# Patient Record
Sex: Male | Born: 1955 | Race: White | Hispanic: No | Smoking: Current every day smoker
Health system: Southern US, Community
[De-identification: ages and names within clinical notes are randomized; demographics above are authoritative.]

## PROBLEM LIST (undated history)

## (undated) DIAGNOSIS — I219 Acute myocardial infarction, unspecified: Secondary | ICD-10-CM

---

## 2008-12-24 ENCOUNTER — Encounter
Admission: RE | Admit: 2008-12-24 | Discharge: 2009-03-01 | Payer: Self-pay | Admitting: Physical Medicine and Rehabilitation

## 2009-01-03 ENCOUNTER — Encounter (HOSPITAL_COMMUNITY)
Admission: RE | Admit: 2009-01-03 | Discharge: 2009-02-02 | Payer: Self-pay | Admitting: Physical Medicine and Rehabilitation

## 2014-04-01 ENCOUNTER — Emergency Department (HOSPITAL_COMMUNITY): Payer: Medicare Other | Admitting: Certified Registered Nurse Anesthetist

## 2014-04-01 ENCOUNTER — Inpatient Hospital Stay (HOSPITAL_COMMUNITY)
Admission: EM | Admit: 2014-04-01 | Discharge: 2014-06-01 | DRG: 003 | Disposition: E | Payer: Medicare Other | Attending: Internal Medicine | Admitting: Internal Medicine

## 2014-04-01 ENCOUNTER — Encounter (HOSPITAL_COMMUNITY): Admission: EM | Disposition: E | Payer: Medicare Other | Source: Home / Self Care | Attending: Internal Medicine

## 2014-04-01 ENCOUNTER — Encounter (HOSPITAL_COMMUNITY): Payer: Self-pay | Admitting: Emergency Medicine

## 2014-04-01 ENCOUNTER — Encounter (HOSPITAL_COMMUNITY): Admission: EM | Disposition: E | Payer: Self-pay | Source: Home / Self Care | Attending: Internal Medicine

## 2014-04-01 ENCOUNTER — Inpatient Hospital Stay (HOSPITAL_COMMUNITY): Payer: Medicare Other

## 2014-04-01 DIAGNOSIS — R6521 Severe sepsis with septic shock: Secondary | ICD-10-CM | POA: Diagnosis not present

## 2014-04-01 DIAGNOSIS — I480 Paroxysmal atrial fibrillation: Secondary | ICD-10-CM | POA: Diagnosis present

## 2014-04-01 DIAGNOSIS — I252 Old myocardial infarction: Secondary | ICD-10-CM | POA: Diagnosis not present

## 2014-04-01 DIAGNOSIS — E870 Hyperosmolality and hypernatremia: Secondary | ICD-10-CM | POA: Diagnosis present

## 2014-04-01 DIAGNOSIS — E669 Obesity, unspecified: Secondary | ICD-10-CM | POA: Diagnosis present

## 2014-04-01 DIAGNOSIS — I5021 Acute systolic (congestive) heart failure: Secondary | ICD-10-CM | POA: Diagnosis not present

## 2014-04-01 DIAGNOSIS — I10 Essential (primary) hypertension: Secondary | ICD-10-CM | POA: Diagnosis present

## 2014-04-01 DIAGNOSIS — J811 Chronic pulmonary edema: Secondary | ICD-10-CM

## 2014-04-01 DIAGNOSIS — E872 Acidosis: Secondary | ICD-10-CM | POA: Diagnosis present

## 2014-04-01 DIAGNOSIS — Z720 Tobacco use: Secondary | ICD-10-CM

## 2014-04-01 DIAGNOSIS — Z9119 Patient's noncompliance with other medical treatment and regimen: Secondary | ICD-10-CM | POA: Diagnosis present

## 2014-04-01 DIAGNOSIS — R509 Fever, unspecified: Secondary | ICD-10-CM | POA: Diagnosis present

## 2014-04-01 DIAGNOSIS — I5023 Acute on chronic systolic (congestive) heart failure: Secondary | ICD-10-CM | POA: Diagnosis present

## 2014-04-01 DIAGNOSIS — F17210 Nicotine dependence, cigarettes, uncomplicated: Secondary | ICD-10-CM | POA: Diagnosis present

## 2014-04-01 DIAGNOSIS — R570 Cardiogenic shock: Secondary | ICD-10-CM

## 2014-04-01 DIAGNOSIS — I25110 Atherosclerotic heart disease of native coronary artery with unstable angina pectoris: Secondary | ICD-10-CM | POA: Diagnosis not present

## 2014-04-01 DIAGNOSIS — I509 Heart failure, unspecified: Secondary | ICD-10-CM | POA: Diagnosis not present

## 2014-04-01 DIAGNOSIS — D6959 Other secondary thrombocytopenia: Secondary | ICD-10-CM | POA: Diagnosis present

## 2014-04-01 DIAGNOSIS — J80 Acute respiratory distress syndrome: Secondary | ICD-10-CM

## 2014-04-01 DIAGNOSIS — Z515 Encounter for palliative care: Secondary | ICD-10-CM

## 2014-04-01 DIAGNOSIS — Z430 Encounter for attention to tracheostomy: Secondary | ICD-10-CM

## 2014-04-01 DIAGNOSIS — E46 Unspecified protein-calorie malnutrition: Secondary | ICD-10-CM | POA: Diagnosis present

## 2014-04-01 DIAGNOSIS — Z978 Presence of other specified devices: Secondary | ICD-10-CM

## 2014-04-01 DIAGNOSIS — N183 Chronic kidney disease, stage 3 (moderate): Secondary | ICD-10-CM | POA: Diagnosis present

## 2014-04-01 DIAGNOSIS — E878 Other disorders of electrolyte and fluid balance, not elsewhere classified: Secondary | ICD-10-CM | POA: Diagnosis present

## 2014-04-01 DIAGNOSIS — J15212 Pneumonia due to Methicillin resistant Staphylococcus aureus: Secondary | ICD-10-CM | POA: Diagnosis not present

## 2014-04-01 DIAGNOSIS — E871 Hypo-osmolality and hyponatremia: Secondary | ICD-10-CM | POA: Diagnosis present

## 2014-04-01 DIAGNOSIS — Z7982 Long term (current) use of aspirin: Secondary | ICD-10-CM | POA: Diagnosis not present

## 2014-04-01 DIAGNOSIS — I2510 Atherosclerotic heart disease of native coronary artery without angina pectoris: Secondary | ICD-10-CM

## 2014-04-01 DIAGNOSIS — T82857A Stenosis of cardiac prosthetic devices, implants and grafts, initial encounter: Secondary | ICD-10-CM | POA: Diagnosis present

## 2014-04-01 DIAGNOSIS — A419 Sepsis, unspecified organism: Secondary | ICD-10-CM | POA: Diagnosis not present

## 2014-04-01 DIAGNOSIS — I2102 ST elevation (STEMI) myocardial infarction involving left anterior descending coronary artery: Secondary | ICD-10-CM

## 2014-04-01 DIAGNOSIS — J9601 Acute respiratory failure with hypoxia: Secondary | ICD-10-CM | POA: Diagnosis not present

## 2014-04-01 DIAGNOSIS — E876 Hypokalemia: Secondary | ICD-10-CM | POA: Diagnosis present

## 2014-04-01 DIAGNOSIS — I471 Supraventricular tachycardia: Secondary | ICD-10-CM | POA: Diagnosis present

## 2014-04-01 DIAGNOSIS — Z4659 Encounter for fitting and adjustment of other gastrointestinal appliance and device: Secondary | ICD-10-CM

## 2014-04-01 DIAGNOSIS — B9562 Methicillin resistant Staphylococcus aureus infection as the cause of diseases classified elsewhere: Secondary | ICD-10-CM | POA: Diagnosis present

## 2014-04-01 DIAGNOSIS — G9340 Encephalopathy, unspecified: Secondary | ICD-10-CM | POA: Diagnosis not present

## 2014-04-01 DIAGNOSIS — N179 Acute kidney failure, unspecified: Secondary | ICD-10-CM | POA: Diagnosis not present

## 2014-04-01 DIAGNOSIS — J95851 Ventilator associated pneumonia: Secondary | ICD-10-CM | POA: Diagnosis not present

## 2014-04-01 DIAGNOSIS — J810 Acute pulmonary edema: Secondary | ICD-10-CM | POA: Insufficient documentation

## 2014-04-01 DIAGNOSIS — K7200 Acute and subacute hepatic failure without coma: Secondary | ICD-10-CM | POA: Diagnosis not present

## 2014-04-01 DIAGNOSIS — J9500 Unspecified tracheostomy complication: Secondary | ICD-10-CM

## 2014-04-01 DIAGNOSIS — I2121 ST elevation (STEMI) myocardial infarction involving left circumflex coronary artery: Secondary | ICD-10-CM | POA: Diagnosis not present

## 2014-04-01 DIAGNOSIS — J9690 Respiratory failure, unspecified, unspecified whether with hypoxia or hypercapnia: Secondary | ICD-10-CM

## 2014-04-01 DIAGNOSIS — I4891 Unspecified atrial fibrillation: Secondary | ICD-10-CM | POA: Diagnosis present

## 2014-04-01 DIAGNOSIS — I2119 ST elevation (STEMI) myocardial infarction involving other coronary artery of inferior wall: Secondary | ICD-10-CM | POA: Diagnosis present

## 2014-04-01 DIAGNOSIS — E785 Hyperlipidemia, unspecified: Secondary | ICD-10-CM | POA: Diagnosis present

## 2014-04-01 DIAGNOSIS — I213 ST elevation (STEMI) myocardial infarction of unspecified site: Secondary | ICD-10-CM | POA: Diagnosis not present

## 2014-04-01 DIAGNOSIS — K5900 Constipation, unspecified: Secondary | ICD-10-CM | POA: Diagnosis present

## 2014-04-01 DIAGNOSIS — I2109 ST elevation (STEMI) myocardial infarction involving other coronary artery of anterior wall: Principal | ICD-10-CM | POA: Diagnosis present

## 2014-04-01 DIAGNOSIS — Z6831 Body mass index (BMI) 31.0-31.9, adult: Secondary | ICD-10-CM | POA: Diagnosis not present

## 2014-04-01 DIAGNOSIS — D649 Anemia, unspecified: Secondary | ICD-10-CM | POA: Diagnosis present

## 2014-04-01 DIAGNOSIS — Z452 Encounter for adjustment and management of vascular access device: Secondary | ICD-10-CM

## 2014-04-01 DIAGNOSIS — I129 Hypertensive chronic kidney disease with stage 1 through stage 4 chronic kidney disease, or unspecified chronic kidney disease: Secondary | ICD-10-CM | POA: Diagnosis present

## 2014-04-01 DIAGNOSIS — I255 Ischemic cardiomyopathy: Secondary | ICD-10-CM | POA: Diagnosis present

## 2014-04-01 DIAGNOSIS — F17200 Nicotine dependence, unspecified, uncomplicated: Secondary | ICD-10-CM | POA: Diagnosis present

## 2014-04-01 DIAGNOSIS — K761 Chronic passive congestion of liver: Secondary | ICD-10-CM | POA: Diagnosis present

## 2014-04-01 DIAGNOSIS — R0902 Hypoxemia: Secondary | ICD-10-CM

## 2014-04-01 DIAGNOSIS — Z9861 Coronary angioplasty status: Secondary | ICD-10-CM

## 2014-04-01 DIAGNOSIS — Z9289 Personal history of other medical treatment: Secondary | ICD-10-CM

## 2014-04-01 DIAGNOSIS — J189 Pneumonia, unspecified organism: Secondary | ICD-10-CM

## 2014-04-01 DIAGNOSIS — J9600 Acute respiratory failure, unspecified whether with hypoxia or hypercapnia: Secondary | ICD-10-CM

## 2014-04-01 DIAGNOSIS — I515 Myocardial degeneration: Secondary | ICD-10-CM

## 2014-04-01 DIAGNOSIS — E1165 Type 2 diabetes mellitus with hyperglycemia: Secondary | ICD-10-CM | POA: Diagnosis present

## 2014-04-01 DIAGNOSIS — Z01818 Encounter for other preprocedural examination: Secondary | ICD-10-CM

## 2014-04-01 HISTORY — PX: CARDIAC CATHETERIZATION: SHX172

## 2014-04-01 HISTORY — DX: Acute myocardial infarction, unspecified: I21.9

## 2014-04-01 HISTORY — PX: LEFT HEART CATHETERIZATION WITH CORONARY ANGIOGRAM: SHX5451

## 2014-04-01 LAB — POCT ACTIVATED CLOTTING TIME
ACTIVATED CLOTTING TIME: 349 s
ACTIVATED CLOTTING TIME: 171 s
ACTIVATED CLOTTING TIME: 171 s
ACTIVATED CLOTTING TIME: 190 s
Activated Clotting Time: 122 seconds
Activated Clotting Time: 147 seconds
Activated Clotting Time: >1000 seconds
Activated Clotting Time: 221 seconds
Activated Clotting Time: 134 seconds
Activated Clotting Time: 208 seconds
Activated Clotting Time: 177 seconds
Activated Clotting Time: 171 seconds

## 2014-04-01 LAB — POCT I-STAT 3, ART BLOOD GAS (G3+)
ACID-BASE DEFICIT: 9 mmol/L — AB (ref 0.0–2.0)
Acid-base deficit: 10 mmol/L — ABNORMAL HIGH (ref 0.0–2.0)
Acid-base deficit: 6 mmol/L — ABNORMAL HIGH (ref 0.0–2.0)
Acid-base deficit: 7 mmol/L — ABNORMAL HIGH (ref 0.0–2.0)
Acid-base deficit: 6 mmol/L — ABNORMAL HIGH (ref 0.0–2.0)
Acid-base deficit: 8 mmol/L — ABNORMAL HIGH (ref 0.0–2.0)
BICARBONATE: 17.2 meq/L — AB (ref 20.0–24.0)
BICARBONATE: 15.5 meq/L — AB (ref 20.0–24.0)
BICARBONATE: 15.2 meq/L — AB (ref 20.0–24.0)
Bicarbonate: 19.2 mEq/L — ABNORMAL LOW (ref 20.0–24.0)
Bicarbonate: 17.8 mEq/L — ABNORMAL LOW (ref 20.0–24.0)
Bicarbonate: 18.3 mEq/L — ABNORMAL LOW (ref 20.0–24.0)
O2 SAT: 97 %
O2 SAT: 100 %
O2 SAT: 100 %
O2 Saturation: 93 %
O2 Saturation: 98 %
O2 Saturation: 100 %
PCO2 ART: 41.8 mmHg (ref 35.0–45.0)
PCO2 ART: 32.4 mmHg — AB (ref 35.0–45.0)
PCO2 ART: 31 mmHg — AB (ref 35.0–45.0)
PCO2 ART: 25.1 mmHg — AB (ref 35.0–45.0)
PH ART: 7.347 — AB (ref 7.350–7.450)
PH ART: 7.347 — AB (ref 7.350–7.450)
PO2 ART: 252 mmHg — AB (ref 80.0–100.0)
TCO2: 18 mmol/L (ref 0–100)
TCO2: 20 mmol/L (ref 0–100)
TCO2: 19 mmol/L (ref 0–100)
TCO2: 19 mmol/L (ref 0–100)
TCO2: 16 mmol/L (ref 0–100)
TCO2: 16 mmol/L (ref 0–100)
pCO2 arterial: 36.3 mmHg (ref 35.0–45.0)
pCO2 arterial: 28.3 mmHg — ABNORMAL LOW (ref 35.0–45.0)
pH, Arterial: 7.221 — ABNORMAL LOW (ref 7.350–7.450)
pH, Arterial: 7.33 — ABNORMAL LOW (ref 7.350–7.450)
pH, Arterial: 7.379 (ref 7.350–7.450)
pH, Arterial: 7.391 (ref 7.350–7.450)
pO2, Arterial: 81 mmHg (ref 80.0–100.0)
pO2, Arterial: 102 mmHg — ABNORMAL HIGH (ref 80.0–100.0)
pO2, Arterial: 112 mmHg — ABNORMAL HIGH (ref 80.0–100.0)
pO2, Arterial: 242 mmHg — ABNORMAL HIGH (ref 80.0–100.0)
pO2, Arterial: 195 mmHg — ABNORMAL HIGH (ref 80.0–100.0)

## 2014-04-01 LAB — CARBOXYHEMOGLOBIN
CARBOXYHEMOGLOBIN: 0.8 % (ref 0.5–1.5)
CARBOXYHEMOGLOBIN: 0.9 % (ref 0.5–1.5)
CARBOXYHEMOGLOBIN: 1.1 % (ref 0.5–1.5)
Carboxyhemoglobin: 0.9 % (ref 0.5–1.5)
METHEMOGLOBIN: 0.9 % (ref 0.0–1.5)
METHEMOGLOBIN: 0.7 % (ref 0.0–1.5)
Methemoglobin: 0.9 % (ref 0.0–1.5)
Methemoglobin: 0.9 % (ref 0.0–1.5)
O2 SAT: 53 %
O2 SAT: 52.4 %
O2 SAT: 46.4 %
O2 Saturation: 46 %
Total hemoglobin: 17.7 g/dL (ref 13.5–18.0)
Total hemoglobin: 13.4 g/dL — ABNORMAL LOW (ref 13.5–18.0)
Total hemoglobin: 17 g/dL (ref 13.5–18.0)
Total hemoglobin: 11.9 g/dL — ABNORMAL LOW (ref 13.5–18.0)

## 2014-04-01 LAB — CBC
HCT: 43 % (ref 39.0–52.0)
HCT: 38.6 % — ABNORMAL LOW (ref 39.0–52.0)
HCT: 34.7 % — ABNORMAL LOW (ref 39.0–52.0)
Hemoglobin: 15 g/dL (ref 13.0–17.0)
Hemoglobin: 13.2 g/dL (ref 13.0–17.0)
Hemoglobin: 11.7 g/dL — ABNORMAL LOW (ref 13.0–17.0)
MCH: 30.5 pg (ref 26.0–34.0)
MCH: 29.9 pg (ref 26.0–34.0)
MCH: 29.7 pg (ref 26.0–34.0)
MCHC: 34.9 g/dL (ref 30.0–36.0)
MCHC: 34.2 g/dL (ref 30.0–36.0)
MCHC: 33.7 g/dL (ref 30.0–36.0)
MCV: 87.4 fL (ref 78.0–100.0)
MCV: 87.5 fL (ref 78.0–100.0)
MCV: 88.1 fL (ref 78.0–100.0)
Platelets: 279 10*3/uL (ref 150–400)
Platelets: 243 10*3/uL (ref 150–400)
Platelets: 237 10*3/uL (ref 150–400)
RBC: 4.92 MIL/uL (ref 4.22–5.81)
RBC: 4.41 MIL/uL (ref 4.22–5.81)
RBC: 3.94 MIL/uL — AB (ref 4.22–5.81)
RDW: 13.1 % (ref 11.5–15.5)
RDW: 13.3 % (ref 11.5–15.5)
RDW: 13.3 % (ref 11.5–15.5)
WBC: 15.4 10*3/uL — AB (ref 4.0–10.5)
WBC: 13 10*3/uL — AB (ref 4.0–10.5)
WBC: 17.5 10*3/uL — ABNORMAL HIGH (ref 4.0–10.5)

## 2014-04-01 LAB — BASIC METABOLIC PANEL
Anion gap: 10 (ref 5–15)
Anion gap: 5 (ref 5–15)
Anion gap: 4 — ABNORMAL LOW (ref 5–15)
BUN: 6 mg/dL (ref 6–23)
BUN: 8 mg/dL (ref 6–23)
BUN: 11 mg/dL (ref 6–23)
CALCIUM: 7.9 mg/dL — AB (ref 8.4–10.5)
CHLORIDE: 104 mmol/L (ref 96–112)
CO2: 17 mmol/L — ABNORMAL LOW (ref 19–32)
CO2: 19 mmol/L (ref 19–32)
CO2: 20 mmol/L (ref 19–32)
CREATININE: 1.11 mg/dL (ref 0.50–1.35)
CREATININE: 1.23 mg/dL (ref 0.50–1.35)
Calcium: 7.1 mg/dL — ABNORMAL LOW (ref 8.4–10.5)
Calcium: 7.3 mg/dL — ABNORMAL LOW (ref 8.4–10.5)
Chloride: 112 mmol/L (ref 96–112)
Chloride: 110 mmol/L (ref 96–112)
Creatinine, Ser: 1.37 mg/dL — ABNORMAL HIGH (ref 0.50–1.35)
GFR calc Af Amer: 64 mL/min — ABNORMAL LOW (ref >90)
GFR calc Af Amer: 83 mL/min — ABNORMAL LOW (ref >90)
GFR calc Af Amer: 73 mL/min — ABNORMAL LOW (ref >90)
GFR calc non Af Amer: 55 mL/min — ABNORMAL LOW (ref >90)
GFR calc non Af Amer: 63 mL/min — ABNORMAL LOW (ref >90)
GFR, EST NON AFRICAN AMERICAN: 71 mL/min — AB (ref >90)
GLUCOSE: 226 mg/dL — AB (ref 70–99)
Glucose, Bld: 174 mg/dL — ABNORMAL HIGH (ref 70–99)
Glucose, Bld: 221 mg/dL — ABNORMAL HIGH (ref 70–99)
POTASSIUM: 3.5 mmol/L (ref 3.5–5.1)
Potassium: 4.2 mmol/L (ref 3.5–5.1)
Potassium: 4.2 mmol/L (ref 3.5–5.1)
SODIUM: 134 mmol/L — AB (ref 135–145)
Sodium: 131 mmol/L — ABNORMAL LOW (ref 135–145)
Sodium: 136 mmol/L (ref 135–145)

## 2014-04-01 LAB — DIFFERENTIAL
BASOS PCT: 0 % (ref 0–1)
Basophils Absolute: 0 10*3/uL (ref 0.0–0.1)
EOS ABS: 0 10*3/uL (ref 0.0–0.7)
Eosinophils Relative: 0 % (ref 0–5)
Lymphocytes Relative: 14 % (ref 12–46)
Lymphs Abs: 2.1 10*3/uL (ref 0.7–4.0)
Monocytes Absolute: 1.1 10*3/uL — ABNORMAL HIGH (ref 0.1–1.0)
Monocytes Relative: 7 % (ref 3–12)
Neutro Abs: 12.2 10*3/uL — ABNORMAL HIGH (ref 1.7–7.7)
Neutrophils Relative %: 79 % — ABNORMAL HIGH (ref 43–77)

## 2014-04-01 LAB — LACTATE DEHYDROGENASE: LDH: 2325 U/L — ABNORMAL HIGH (ref 94–250)

## 2014-04-01 LAB — POCT I-STAT, CHEM 8
BUN: 8 mg/dL (ref 6–23)
CREATININE: 1.3 mg/dL (ref 0.50–1.35)
Calcium, Ion: 1.05 mmol/L — ABNORMAL LOW (ref 1.12–1.23)
Chloride: 104 mmol/L (ref 96–112)
Glucose, Bld: 213 mg/dL — ABNORMAL HIGH (ref 70–99)
HCT: 44 % (ref 39.0–52.0)
Hemoglobin: 15 g/dL (ref 13.0–17.0)
POTASSIUM: 4.8 mmol/L (ref 3.5–5.1)
Sodium: 135 mmol/L (ref 135–145)
TCO2: 19 mmol/L (ref 0–100)

## 2014-04-01 LAB — GLUCOSE, CAPILLARY
Glucose-Capillary: 185 mg/dL — ABNORMAL HIGH (ref 70–99)
Glucose-Capillary: 163 mg/dL — ABNORMAL HIGH (ref 70–99)

## 2014-04-01 LAB — APTT: APTT: 138 s — AB (ref 24–37)

## 2014-04-01 LAB — POCT I-STAT 3, VENOUS BLOOD GAS (G3P V)
Acid-base deficit: 7 mmol/L — ABNORMAL HIGH (ref 0.0–2.0)
Bicarbonate: 19.2 mEq/L — ABNORMAL LOW (ref 20.0–24.0)
O2 Saturation: 57 %
PCO2 VEN: 38.4 mmHg — AB (ref 45.0–50.0)
PH VEN: 7.308 — AB (ref 7.250–7.300)
TCO2: 20 mmol/L (ref 0–100)
pO2, Ven: 32 mmHg (ref 30.0–45.0)

## 2014-04-01 LAB — FIBRINOGEN: Fibrinogen: 577 mg/dL — ABNORMAL HIGH (ref 204–475)

## 2014-04-01 LAB — PROTIME-INR
INR: 1.18 (ref 0.00–1.49)
Prothrombin Time: 15.1 seconds (ref 11.6–15.2)

## 2014-04-01 LAB — TROPONIN I

## 2014-04-01 LAB — MRSA PCR SCREENING: MRSA BY PCR: NEGATIVE

## 2014-04-01 SURGERY — LEFT HEART CATHETERIZATION WITH CORONARY ANGIOGRAM
Anesthesia: LOCAL | Laterality: Bilateral

## 2014-04-01 SURGERY — LEFT HEART CATH
Anesthesia: LOCAL

## 2014-04-01 MED ORDER — TIROFIBAN HCL IV 12.5 MG/250 ML
INTRAVENOUS | Status: AC
  Administered 2014-04-01: 0.15 ug/kg/min via INTRAVENOUS
  Filled 2014-04-01: qty 250

## 2014-04-01 MED ORDER — SODIUM CHLORIDE 0.9 % IV SOLN
INTRAVENOUS | Status: DC | PRN
  Administered 2014-04-01: 07:00:00 via INTRA_ARTERIAL

## 2014-04-01 MED ORDER — PNEUMOCOCCAL VAC POLYVALENT 25 MCG/0.5ML IJ INJ
0.5000 mL | INJECTION | INTRAMUSCULAR | Status: DC | PRN
Start: 2014-04-01 — End: 2014-05-02

## 2014-04-01 MED ORDER — SODIUM CHLORIDE 0.9 % IV BOLUS (SEPSIS)
1000.0000 mL | Freq: Once | INTRAVENOUS | Status: AC
  Administered 2014-04-01: 1000 mL via INTRAVENOUS

## 2014-04-01 MED ORDER — FENTANYL CITRATE 0.05 MG/ML IJ SOLN
50.0000 ug | Freq: Once | INTRAMUSCULAR | Status: AC
  Administered 2014-04-01: 50 ug via INTRAVENOUS

## 2014-04-01 MED ORDER — MIDAZOLAM BOLUS VIA INFUSION
1.0000 mg | INTRAVENOUS | Status: DC | PRN
  Administered 2014-04-01 – 2014-04-06 (×4): 2 mg via INTRAVENOUS
  Filled 2014-04-01 (×5): qty 2

## 2014-04-01 MED ORDER — SODIUM CHLORIDE 0.9 % IJ SOLN: 3.0000 mL | INTRAMUSCULAR | Status: DC | PRN

## 2014-04-01 MED ORDER — TIROFIBAN HCL IV 5 MG/100ML
0.1500 ug/kg/min | INTRAVENOUS | Status: AC
  Administered 2014-04-01: 0.15 ug/kg/min via INTRAVENOUS
  Filled 2014-04-01 (×2): qty 100

## 2014-04-01 MED ORDER — SODIUM BICARBONATE 8.4 % IV SOLN
INTRAVENOUS | Status: AC
  Filled 2014-04-01: qty 50

## 2014-04-01 MED ORDER — HEPARIN (PORCINE) IN NACL 2-0.9 UNIT/ML-% IJ SOLN
INTRAMUSCULAR | Status: AC
  Filled 2014-04-01: qty 1500

## 2014-04-01 MED ORDER — HEPARIN (PORCINE) IN NACL 100-0.45 UNIT/ML-% IJ SOLN
900.0000 [IU]/h | INTRAMUSCULAR | Status: DC
Start: 2014-04-01 — End: 2014-04-01
  Administered 2014-04-01: 900 [IU]/h via INTRAVENOUS
  Filled 2014-04-01: qty 250

## 2014-04-01 MED ORDER — ONDANSETRON HCL 4 MG/2ML IJ SOLN
4.0000 mg | Freq: Once | INTRAMUSCULAR | Status: AC
  Administered 2014-04-01: 4 mg via INTRAVENOUS

## 2014-04-01 MED ORDER — FENTANYL CITRATE 0.05 MG/ML IJ SOLN
150.0000 ug/h | INTRAMUSCULAR | Status: DC
  Administered 2014-04-01: 25 ug/h via INTRAVENOUS
  Administered 2014-04-05: 50 ug/h via INTRAVENOUS
  Administered 2014-04-06: 100 ug/h via INTRAVENOUS
  Administered 2014-04-07: 350 ug/h via INTRAVENOUS
  Administered 2014-04-08: 250 ug/h via INTRAVENOUS
  Administered 2014-04-08: 350 ug/h via INTRAVENOUS
  Administered 2014-04-08 – 2014-04-09 (×6): 400 ug/h via INTRAVENOUS
  Administered 2014-04-10: 300 ug/h via INTRAVENOUS
  Administered 2014-04-10: 275 ug/h via INTRAVENOUS
  Administered 2014-04-10 – 2014-04-11 (×2): 350 ug/h via INTRAVENOUS
  Administered 2014-04-11: 275 ug/h via INTRAVENOUS
  Administered 2014-04-12 – 2014-04-13 (×7): 400 ug/h via INTRAVENOUS
  Administered 2014-04-14: 350 ug/h via INTRAVENOUS
  Administered 2014-04-14 (×3): 400 ug/h via INTRAVENOUS
  Administered 2014-04-15 (×2): 350 ug/h via INTRAVENOUS
  Administered 2014-04-16: 325 ug/h via INTRAVENOUS
  Administered 2014-04-17 (×3): 350 ug/h via INTRAVENOUS
  Administered 2014-04-20 (×2): 250 ug/h via INTRAVENOUS
  Administered 2014-04-21: 300 ug/h via INTRAVENOUS
  Administered 2014-04-21 (×2): 100 ug/h via INTRAVENOUS
  Administered 2014-04-22 (×2): 300 ug/h via INTRAVENOUS
  Administered 2014-04-23 (×2): 200 ug/h via INTRAVENOUS
  Administered 2014-04-24: 150 ug/h via INTRAVENOUS
  Administered 2014-04-25: 200 ug/h via INTRAVENOUS
  Administered 2014-04-25 – 2014-04-29 (×4): 150 ug/h via INTRAVENOUS
  Administered 2014-04-30: 125 ug/h via INTRAVENOUS
  Administered 2014-04-30: 100 ug/h via INTRAVENOUS
  Administered 2014-05-01 – 2014-05-02 (×2): 150 ug/h via INTRAVENOUS
  Administered 2014-05-02: 250 ug/h via INTRAVENOUS
  Filled 2014-04-01 (×61): qty 50

## 2014-04-01 MED ORDER — LIDOCAINE HCL (PF) 1 % IJ SOLN
INTRAMUSCULAR | Status: AC
  Filled 2014-04-01: qty 30

## 2014-04-01 MED ORDER — HEPARIN BOLUS VIA INFUSION
4000.0000 [IU] | Freq: Once | INTRAVENOUS | Status: AC
  Administered 2014-04-01: 4000 [IU] via INTRAVENOUS
  Filled 2014-04-01: qty 4000

## 2014-04-01 MED ORDER — HEPARIN SODIUM (PORCINE) 5000 UNIT/ML IJ SOLN
25000.0000 [IU] | INTRAVENOUS | Status: DC
  Administered 2014-04-01 – 2014-04-05 (×5): 25000 [IU]
  Filled 2014-04-01 (×9): qty 5

## 2014-04-01 MED ORDER — NITROGLYCERIN 1 MG/10 ML FOR IR/CATH LAB
INTRA_ARTERIAL | Status: AC
  Filled 2014-04-01: qty 10

## 2014-04-01 MED ORDER — SODIUM CHLORIDE 0.9 % IV SOLN: 0.0000 mg/h | INTRAVENOUS | Status: DC

## 2014-04-01 MED ORDER — TIROFIBAN HCL IV 5 MG/100ML
0.1500 ug/kg/min | INTRAVENOUS | Status: DC
  Administered 2014-04-01: 0.15 ug/kg/min via INTRAVENOUS
  Filled 2014-04-01: qty 100

## 2014-04-01 MED ORDER — ONDANSETRON HCL 4 MG/2ML IJ SOLN: 4.0000 mg | Freq: Four times a day (QID) | INTRAMUSCULAR | Status: DC | PRN

## 2014-04-01 MED ORDER — SODIUM CHLORIDE 0.9 % IV BOLUS (SEPSIS)
250.0000 mL | Freq: Once | INTRAVENOUS | Status: AC
  Administered 2014-04-01: 250 mL via INTRAVENOUS

## 2014-04-01 MED ORDER — ASPIRIN 81 MG PO CHEW
81.0000 mg | CHEWABLE_TABLET | Freq: Every day | ORAL | Status: DC
  Administered 2014-04-02 – 2014-05-02 (×31): 81 mg
  Filled 2014-04-01 (×30): qty 1

## 2014-04-01 MED ORDER — SODIUM CHLORIDE 0.9 % IV SOLN
INTRAVENOUS | Status: AC
  Administered 2014-04-01: 19:00:00 via INTRAVENOUS

## 2014-04-01 MED ORDER — HEPARIN (PORCINE) IN NACL 100-0.45 UNIT/ML-% IJ SOLN
500.0000 [IU]/h | INTRAMUSCULAR | Status: DC
  Administered 2014-04-02: 1300 [IU]/h via INTRAVENOUS
  Administered 2014-04-02: 1200 [IU]/h via INTRAVENOUS
  Administered 2014-04-02: 1000 [IU]/h via INTRAVENOUS
  Administered 2014-04-03: 1500 [IU]/h via INTRAVENOUS
  Administered 2014-04-03: 1400 [IU]/h via INTRAVENOUS
  Filled 2014-04-01 (×5): qty 250

## 2014-04-01 MED ORDER — CEFAZOLIN SODIUM-DEXTROSE 2-3 GM-% IV SOLR
INTRAVENOUS | Status: AC
  Filled 2014-04-01: qty 50

## 2014-04-01 MED ORDER — HEPARIN SODIUM (PORCINE) 1000 UNIT/ML IJ SOLN
INTRAMUSCULAR | Status: AC
  Filled 2014-04-01: qty 1

## 2014-04-01 MED ORDER — SODIUM CHLORIDE 0.9 % IJ SOLN
3.0000 mL | Freq: Two times a day (BID) | INTRAMUSCULAR | Status: DC
  Administered 2014-04-01 – 2014-04-09 (×8): 3 mL via INTRAVENOUS
  Administered 2014-04-09: 10 mL via INTRAVENOUS
  Administered 2014-04-10: 3 mL via INTRAVENOUS
  Administered 2014-04-10: 5 mL via INTRAVENOUS
  Administered 2014-04-11 – 2014-04-17 (×9): 3 mL via INTRAVENOUS

## 2014-04-01 MED ORDER — PANTOPRAZOLE SODIUM 40 MG IV SOLR
40.0000 mg | Freq: Every day | INTRAVENOUS | Status: DC
  Administered 2014-04-01 – 2014-04-02 (×2): 40 mg via INTRAVENOUS
  Filled 2014-04-01 (×2): qty 40

## 2014-04-01 MED ORDER — SUCCINYLCHOLINE CHLORIDE 20 MG/ML IJ SOLN
INTRAMUSCULAR | Status: DC | PRN
  Administered 2014-04-01: 120 mg via INTRAVENOUS

## 2014-04-01 MED ORDER — TICAGRELOR 90 MG PO TABS
180.0000 mg | ORAL_TABLET | Freq: Once | ORAL | Status: AC
  Administered 2014-04-01: 180 mg
  Filled 2014-04-01: qty 2

## 2014-04-01 MED ORDER — NOREPINEPHRINE BITARTRATE 1 MG/ML IV SOLN
0.0000 ug/min | Freq: Once | INTRAVENOUS | Status: AC
  Administered 2014-04-01: 10 ug/min via INTRAVENOUS

## 2014-04-01 MED ORDER — BIVALIRUDIN 250 MG IV SOLR
INTRAVENOUS | Status: AC
  Filled 2014-04-01: qty 250

## 2014-04-01 MED ORDER — SODIUM CHLORIDE 0.9 % IV SOLN
250.0000 mL | INTRAVENOUS | Status: DC | PRN
  Administered 2014-04-01: 250 mL via INTRAVENOUS

## 2014-04-01 MED ORDER — CETYLPYRIDINIUM CHLORIDE 0.05 % MT LIQD
7.0000 mL | Freq: Four times a day (QID) | OROMUCOSAL | Status: DC
  Administered 2014-04-01 – 2014-05-02 (×124): 7 mL via OROMUCOSAL

## 2014-04-01 MED ORDER — SODIUM CHLORIDE 0.9 % IV BOLUS (SEPSIS)
500.0000 mL | Freq: Once | INTRAVENOUS | Status: AC
  Administered 2014-04-01: 500 mL via INTRAVENOUS

## 2014-04-01 MED ORDER — HEPARIN SODIUM (PORCINE) 5000 UNIT/ML IJ SOLN
60.0000 [IU]/kg | Freq: Once | INTRAMUSCULAR | Status: AC
  Administered 2014-04-01: 4000 [IU] via INTRAVENOUS

## 2014-04-01 MED ORDER — NOREPINEPHRINE BITARTRATE 1 MG/ML IV SOLN
0.0000 ug/min | INTRAVENOUS | Status: DC
  Administered 2014-04-01: 5 ug/min via INTRAVENOUS
  Administered 2014-04-03: 26 ug/min via INTRAVENOUS
  Administered 2014-04-03: 23 ug/min via INTRAVENOUS
  Administered 2014-04-03: 20 ug/min via INTRAVENOUS
  Administered 2014-04-04: 10 ug/min via INTRAVENOUS
  Administered 2014-04-04: 18 ug/min via INTRAVENOUS
  Administered 2014-04-05: 4 ug/min via INTRAVENOUS
  Administered 2014-04-05: 5 ug/min via INTRAVENOUS
  Filled 2014-04-01 (×9): qty 16

## 2014-04-01 MED ORDER — TICAGRELOR 90 MG PO TABS
90.0000 mg | ORAL_TABLET | Freq: Two times a day (BID) | ORAL | Status: DC
  Administered 2014-04-01 – 2014-05-02 (×62): 90 mg
  Filled 2014-04-01 (×64): qty 1

## 2014-04-01 MED ORDER — ACETAMINOPHEN 325 MG PO TABS
650.0000 mg | ORAL_TABLET | ORAL | Status: DC | PRN
  Administered 2014-04-09 – 2014-04-17 (×3): 650 mg via ORAL
  Filled 2014-04-01 (×3): qty 2

## 2014-04-01 MED ORDER — ETOMIDATE 2 MG/ML IV SOLN
INTRAVENOUS | Status: DC | PRN
  Administered 2014-04-01: 20 mg via INTRAVENOUS

## 2014-04-01 MED ORDER — DEXTROSE 5 % SOLN FOR IMPELLA PURGE CATHETER
INTRAVENOUS | Status: DC
  Filled 2014-04-01: qty 1000

## 2014-04-01 MED ORDER — DIGOXIN 0.25 MG/ML IJ SOLN
0.2500 mg | Freq: Every day | INTRAMUSCULAR | Status: DC
  Administered 2014-04-01 – 2014-04-21 (×21): 0.25 mg via INTRAVENOUS
  Filled 2014-04-01 (×23): qty 1

## 2014-04-01 MED ORDER — SODIUM CHLORIDE 0.9 % IV SOLN
INTRAVENOUS | Status: DC | PRN
  Administered 2014-04-01: 1000 mL via INTRAVENOUS

## 2014-04-01 MED ORDER — SODIUM CHLORIDE 0.9 % IV SOLN
1.0000 mg/h | INTRAVENOUS | Status: AC
  Administered 2014-04-01: 1 mg/h via INTRAVENOUS
  Filled 2014-04-01: qty 10

## 2014-04-01 MED ORDER — ATORVASTATIN CALCIUM 80 MG PO TABS
80.0000 mg | ORAL_TABLET | Freq: Every day | ORAL | Status: DC
  Administered 2014-04-01 – 2014-05-01 (×30): 80 mg
  Filled 2014-04-01 (×32): qty 1

## 2014-04-01 MED ORDER — SODIUM CHLORIDE 0.9 % IV SOLN
INTRAVENOUS | Status: AC
  Administered 2014-04-01: 07:00:00 via INTRAVENOUS

## 2014-04-01 MED ORDER — SODIUM CHLORIDE 0.9 % IV SOLN
0.0000 mg/h | INTRAVENOUS | Status: DC
  Administered 2014-04-01 – 2014-04-02 (×2): 2 mg/h via INTRAVENOUS
  Administered 2014-04-03 – 2014-04-05 (×4): 3 mg/h via INTRAVENOUS
  Administered 2014-04-05 – 2014-04-07 (×3): 2 mg/h via INTRAVENOUS
  Filled 2014-04-01 (×8): qty 10

## 2014-04-01 MED ORDER — SODIUM CHLORIDE 0.9 % IV SOLN
10.0000 ug/h | INTRAVENOUS | Status: DC
  Administered 2014-04-01: 10 ug/h via INTRAVENOUS
  Filled 2014-04-01: qty 50

## 2014-04-01 MED ORDER — ASPIRIN 81 MG PO CHEW: 324.0000 mg | CHEWABLE_TABLET | Freq: Once | ORAL | Status: DC

## 2014-04-01 MED ORDER — FENTANYL BOLUS VIA INFUSION
50.0000 ug | INTRAVENOUS | Status: DC | PRN
  Administered 2014-04-02 – 2014-04-10 (×13): 50 ug via INTRAVENOUS
  Filled 2014-04-01 (×2): qty 50

## 2014-04-01 MED ORDER — CHLORHEXIDINE GLUCONATE 0.12 % MT SOLN
15.0000 mL | Freq: Two times a day (BID) | OROMUCOSAL | Status: DC
  Administered 2014-04-01 – 2014-05-02 (×63): 15 mL via OROMUCOSAL
  Filled 2014-04-01 (×63): qty 15

## 2014-04-01 MED ORDER — MILRINONE IN DEXTROSE 20 MG/100ML IV SOLN
0.5000 ug/kg/min | INTRAVENOUS | Status: DC
  Administered 2014-04-01: 0.125 ug/kg/min via INTRAVENOUS
  Administered 2014-04-02 (×2): 0.5 ug/kg/min via INTRAVENOUS
  Administered 2014-04-02: 0.375 ug/kg/min via INTRAVENOUS
  Administered 2014-04-02 – 2014-04-09 (×19): 0.5 ug/kg/min via INTRAVENOUS
  Filled 2014-04-01 (×29): qty 100

## 2014-04-01 MED ORDER — SODIUM CHLORIDE 0.9 % IV SOLN
0.0000 mg/h | INTRAVENOUS | Status: DC
  Filled 2014-04-01: qty 10

## 2014-04-01 MED ORDER — INFLUENZA VAC SPLIT QUAD 0.5 ML IM SUSY: 0.5000 mL | PREFILLED_SYRINGE | INTRAMUSCULAR | Status: DC | PRN

## 2014-04-01 NOTE — Progress Notes (Signed)
  Echocardiogram 2D Echocardiogram limited has been performed.  Micheal Ayala 08-Mar-2014, 8:00 AM

## 2014-04-01 NOTE — Progress Notes (Signed)
Impella rep called for update. Urine slightly pink-tinged. CVPs 13-14. Instructed to change flow control from P-8 to P-7. Will continue to monitor closely.  Micheal Ayala, Micheal Ayala

## 2014-04-01 NOTE — ED Provider Notes (Signed)
CSN: [id number]     Arrival date & time 09/03/14  0135 History  This chart was scribed for Derwood KaplanAnkit Bowman Higbie, MD by Abel PrestoKara Demonbreun, ED Scribe. [facility] and the patient's care was started at 1:36 AM.    Chief Complaint  Patient presents with  . Code STEMI   LEVEL 5 CAVEAT  The history is provided by the patient and the EMS personnel. No language interpreter was used.    Marland Kitchen. HPI Comments: Micheal Ayala is a 59 y.o. male brought in by ambulance, with PMHx of MI in 2002 [facility]([place] or [place]) with stent placement who presents to the Emergency Department complaining of left sided chest pain with onset at 8 PM. Pt notes pain radiates to left shoulder and associated SOB. Pt has not been compliant with his medication. Pt has not seen a cardiologist in 2 years.  Pt denies PMHx of stomache ulcers, DM, and aneurysm. Pt notes occasional EtOH use. Pt is a smoker pack a day. Pt is from out of town and visiting his mother's funeral today. Pt alert. EMS administered 10 mg morphine, 4 Zofran, 0.4mg  NTG, 324mg  ASA, and 500 ccs fluid.  Pt came in as a CODE STEMI, and STEMI activation has been maintained. Cards at bedside. Heparin bolus given.   Past Medical History  Diagnosis Date  . MI (myocardial infarction)    Past Surgical History  Procedure Laterality Date  . Left heart catheterization with coronary angiogram Bilateral 04/26/2014    Procedure: LEFT HEART CATHETERIZATION WITH CORONARY ANGIOGRAM;  Surgeon: Marykay Lexavid W Harding, MD;  [facility];  Service: Cardiovascular;  Laterality: Bilateral;  . Cardiac catheterization  04/26/2014    Procedure: CORONARY STENT INTERVENTION W/IMPELLA;  Surgeon: Marykay Lexavid W Harding, MD;  [facility];  Service: Cardiovascular;;   History reviewed. No pertinent family history. History  Substance Use Topics  . Smoking status: Current Every Day Smoker -- 1.00 packs/day    Types: Cigarettes  . Smokeless tobacco: Not on file  . Alcohol Use: Not  on file    Review of Systems  Unable to perform ROS: Unstable vital signs  Constitutional: Positive for diaphoresis.  Respiratory: Positive for shortness of breath.   Cardiovascular: Positive for chest pain.  Gastrointestinal: Positive for nausea.      Allergies  Review of patient's allergies indicates no known allergies.  Home Medications   Prior to Admission medications   Medication Sig Start Date End Date Taking? Authorizing Provider  amLODipine (NORVASC) 5 MG tablet Take 5 mg by mouth daily.   Yes Historical Provider, MD  aspirin 325 MG tablet Take 325 mg by mouth daily.   Yes Historical Provider, MD  Multiple Vitamins-Minerals (MULTIVITAMIN PO) Take 1 tablet by mouth daily.   Yes Historical Provider, MD  pravastatin (PRAVACHOL) 40 MG tablet Take 40 mg by mouth daily.   Yes Historical Provider, MD  propranolol (INDERAL) 40 MG tablet Take 40 mg by mouth 2 (two) times daily.   Yes Historical Provider, MD   BP 107/68 mmHg  Pulse 131  Temp(Src) 99.1 F (37.3 C) (Core (Comment))  Resp 18  Ht 5\' 11"  (1.803 m)  Wt 228 lb 13.4 oz (103.8 kg)  BMI 31.93 kg/m2  SpO2 68% Physical Exam  Constitutional: He appears well-developed and well-nourished. No distress.  HENT:  Head: Normocephalic and atraumatic.  Eyes: Conjunctivae are normal. Right eye exhibits no discharge. Left eye exhibits no discharge.  Neck: Neck supple.  Cardiovascular: Normal rate,  regular rhythm, normal heart sounds and intact distal pulses.  Exam reveals no gallop and no friction rub.   Pulses:      Radial pulses are 2+ on the right side, and 2+ on the left side.  Pulmonary/Chest: Effort normal and breath sounds normal. No respiratory distress.  Lungs clear to auscultation  Abdominal: Soft. He exhibits no distension. There is no tenderness.  Musculoskeletal: He exhibits no edema or tenderness.  Neurological: He is alert.  Skin: Skin is warm. He is diaphoretic (slightly).  Psychiatric: He has a normal mood  and affect. His behavior is normal. Thought content normal.  Nursing note and vitals reviewed.   ED Course  Procedures (including critical care time) DIAGNOSTIC STUDIES:   COORDINATION OF CARE: 1:34 AM Pt arrived in ED 1:36 AM Dr. Rhunette Croft in [facility] 1:41 AM heparin injection IV 1:43 AM Pt states pain is 2/10 1:44 AM Peripheral IV left hand placed 1:46 AM Zofran injection given  9:14 AM Discussed treatment plan with patient at beside, the patient agrees with the plan and has no further questions at this time.   Labs Review Labs Reviewed  CBC - Abnormal; Notable for the following:    WBC 15.4 (*)    All other components within normal limits  DIFFERENTIAL - Abnormal; Notable for the following:    Neutrophils Relative % 79 (*)    Neutro Abs 12.2 (*)    Monocytes Absolute 1.1 (*)    All other components within normal limits  APTT - Abnormal; Notable for the following:    aPTT 138 (*)    All other components within normal limits  BASIC METABOLIC PANEL - Abnormal; Notable for the following:    Sodium 131 (*)    CO2 17 (*)    Glucose, Bld 226 (*)    Creatinine, Ser 1.37 (*)    Calcium 7.9 (*)    GFR calc non Af Amer 55 (*)    GFR calc Af Amer 64 (*)    All other components within normal limits  TROPONIN I - Abnormal; Notable for the following:    Troponin I >80.00 (*)    All other components within normal limits  TROPONIN I - Abnormal; Notable for the following:    Troponin I >81.00 (*)    All other components within normal limits  GLUCOSE, CAPILLARY - Abnormal; Notable for the following:    Glucose-Capillary 185 (*)    All other components within normal limits  CBC - Abnormal; Notable for the following:    WBC 13.0 (*)    HCT 38.6 (*)    All other components within normal limits  LACTATE DEHYDROGENASE - Abnormal; Notable for the following:    LDH 2325 (*)    All other components within normal limits  FIBRINOGEN - Abnormal; Notable for the following:    Fibrinogen 577  (*)    All other components within normal limits  BASIC METABOLIC PANEL - Abnormal; Notable for the following:    Glucose, Bld 174 (*)    Calcium 7.1 (*)    GFR calc non Af Amer 71 (*)    GFR calc Af Amer 83 (*)    All other components within normal limits  GLUCOSE, CAPILLARY - Abnormal; Notable for the following:    Glucose-Capillary 163 (*)    All other components within normal limits  TROPONIN I - Abnormal; Notable for the following:    Troponin I >81.00 (*)    All other components within normal  limits  CARBOXYHEMOGLOBIN - Abnormal; Notable for the following:    Total hemoglobin 13.4 (*)    All other components within normal limits  BLOOD GAS, ARTERIAL - Abnormal; Notable for the following:    pCO2 arterial 27.3 (*)    pO2, Arterial 162.0 (*)    Bicarbonate 16.3 (*)    Acid-base deficit 7.6 (*)    All other components within normal limits  APTT - Abnormal; Notable for the following:    aPTT 77 (*)    All other components within normal limits  LACTATE DEHYDROGENASE - Abnormal; Notable for the following:    LDH 2125 (*)    All other components within normal limits  CBC WITH DIFFERENTIAL/PLATELET - Abnormal; Notable for the following:    WBC 19.8 (*)    RBC 3.81 (*)    Hemoglobin 11.6 (*)    HCT 33.7 (*)    Neutro Abs 15.1 (*)    Monocytes Absolute 1.9 (*)    All other components within normal limits  COMPREHENSIVE METABOLIC PANEL - Abnormal; Notable for the following:    Sodium 134 (*)    Glucose, Bld 233 (*)    Calcium 7.2 (*)    Total Protein 5.0 (*)    Albumin 2.0 (*)    AST 435 (*)    ALT 133 (*)    Total Bilirubin 2.9 (*)    GFR calc non Af Amer 66 (*)    GFR calc Af Amer 77 (*)    Anion gap 3 (*)    All other components within normal limits  PHOSPHORUS - Abnormal; Notable for the following:    Phosphorus 1.8 (*)    All other components within normal limits  CARBOXYHEMOGLOBIN - Abnormal; Notable for the following:    Total hemoglobin 11.4 (*)    All other  components within normal limits  CARBOXYHEMOGLOBIN - Abnormal; Notable for the following:    Total hemoglobin 11.9 (*)    All other components within normal limits  BASIC METABOLIC PANEL - Abnormal; Notable for the following:    Sodium 134 (*)    Glucose, Bld 221 (*)    Calcium 7.3 (*)    GFR calc non Af Amer 63 (*)    GFR calc Af Amer 73 (*)    Anion gap 4 (*)    All other components within normal limits  CBC - Abnormal; Notable for the following:    WBC 17.5 (*)    RBC 3.94 (*)    Hemoglobin 11.7 (*)    HCT 34.7 (*)    All other components within normal limits  URINALYSIS, ROUTINE W REFLEX MICROSCOPIC - Abnormal; Notable for the following:    Color, Urine ORANGE (*)    APPearance CLOUDY (*)    Hgb urine dipstick SMALL (*)    Bilirubin Urine MODERATE (*)    Ketones, ur 15 (*)    Protein, ur 30 (*)    Urobilinogen, UA 2.0 (*)    Nitrite POSITIVE (*)    Leukocytes, UA SMALL (*)    All other components within normal limits  URINE MICROSCOPIC-ADD ON - Abnormal; Notable for the following:    Bacteria, UA FEW (*)    Casts HYALINE CASTS (*)    All other components within normal limits  CARBOXYHEMOGLOBIN - Abnormal; Notable for the following:    Total hemoglobin 11.1 (*)    All other components within normal limits  POCT I-STAT 3, ART BLOOD GAS (G3+) - Abnormal; Notable for  the following:    pCO2 arterial 31.0 (*)    pO2, Arterial 242.0 (*)    Bicarbonate 18.3 (*)    Acid-base deficit 6.0 (*)    All other components within normal limits  POCT I-STAT 3, ART BLOOD GAS (G3+) - Abnormal; Notable for the following:    pH, Arterial 7.347 (*)    pCO2 arterial 28.3 (*)    pO2, Arterial 252.0 (*)    Bicarbonate 15.5 (*)    Acid-base deficit 9.0 (*)    All other components within normal limits  POCT I-STAT 3, ART BLOOD GAS (G3+) - Abnormal; Notable for the following:    pCO2 arterial 25.1 (*)    pO2, Arterial 195.0 (*)    Bicarbonate 15.2 (*)    Acid-base deficit 8.0 (*)    All  other components within normal limits  POCT I-STAT 3, ART BLOOD GAS (G3+) - Abnormal; Notable for the following:    pH, Arterial 7.221 (*)    Bicarbonate 17.2 (*)    Acid-base deficit 10.0 (*)    All other components within normal limits  POCT I-STAT 3, VENOUS BLOOD GAS (G3P V) - Abnormal; Notable for the following:    pH, Ven 7.308 (*)    pCO2, Ven 38.4 (*)    Bicarbonate 19.2 (*)    Acid-base deficit 7.0 (*)    All other components within normal limits  POCT I-STAT, CHEM 8 - Abnormal; Notable for the following:    Glucose, Bld 213 (*)    Calcium, Ion 1.05 (*)    All other components within normal limits  POCT I-STAT 3, ART BLOOD GAS (G3+) - Abnormal; Notable for the following:    pH, Arterial 7.330 (*)    pO2, Arterial 102.0 (*)    Bicarbonate 19.2 (*)    Acid-base deficit 6.0 (*)    All other components within normal limits  POCT I-STAT 3, ART BLOOD GAS (G3+) - Abnormal; Notable for the following:    pH, Arterial 7.347 (*)    pCO2 arterial 32.4 (*)    pO2, Arterial 112.0 (*)    Bicarbonate 17.8 (*)    Acid-base deficit 7.0 (*)    All other components within normal limits  MRSA PCR SCREENING  PROTIME-INR  CARBOXYHEMOGLOBIN  CARBOXYHEMOGLOBIN  MAGNESIUM  BLOOD GAS, ARTERIAL  BLOOD GAS, ARTERIAL  HEMOGLOBIN A1C  HIV ANTIBODY (ROUTINE TESTING)  CARBOXYHEMOGLOBIN  POCT ACTIVATED CLOTTING TIME  POCT ACTIVATED CLOTTING TIME  POCT ACTIVATED CLOTTING TIME  POCT ACTIVATED CLOTTING TIME  POCT ACTIVATED CLOTTING TIME  POCT ACTIVATED CLOTTING TIME  POCT ACTIVATED CLOTTING TIME  POCT ACTIVATED CLOTTING TIME  POCT ACTIVATED CLOTTING TIME  POCT ACTIVATED CLOTTING TIME  POCT ACTIVATED CLOTTING TIME  POCT ACTIVATED CLOTTING TIME  POCT ACTIVATED CLOTTING TIME  POCT ACTIVATED CLOTTING TIME  POCT ACTIVATED CLOTTING TIME  POCT ACTIVATED CLOTTING TIME  TYPE AND SCREEN  ABO/RH    Imaging Review Dg Chest Port 1 View  04/02/2014   CLINICAL DATA:  Acute respiratory failure.  EXAM:  PORTABLE CHEST - 1 VIEW  COMPARISON:  03/27/2014.  FINDINGS: Endotracheal tube, NG tube and right femoral Swan-Ganz catheter in stable position. Small curl again noted in the Swan-Ganz catheter. Swan-Ganz catheter tip is in the right main pulmonary artery in unchanged position. Interim clearing of bilateral pulmonary edema/infiltrates. No pleural effusion or pneumothorax. Stable cardiomegaly. No acute osseous abnormality.  IMPRESSION: 1. Lines and tubes in stable position. Right femoral Swan-Ganz catheter stable position. Again noted is a  small curl in the Swan-Ganz catheter. 2. Interim clearing of pulmonary edema/infiltrates.   Electronically Signed   By: Maisie Fus  Register   On: 04/02/2014 07:38   Portable Chest Xray  21-Apr-2014   CLINICAL DATA:  Encounter for intubation. S/p intubation EVALUATE ETT POSITION AND OG POSITION  EXAM: PORTABLE CHEST - 1 VIEW  COMPARISON:  None  FINDINGS: Endotracheal tube tip projects 2.5 cm above the carina.  There is a femoral a placed Swan-Ganz catheter that has a tight curl that projects near the junction of the right atrium and the inferior vena cava. Tip projects in the peripheral right pulmonary artery.  Nasal/orogastric tube tip passes well within the stomach.  Hazy airspace opacity is seen in a central distribution most consistent with pulmonary edema.  Cardiac silhouette is mildly enlarged. No mediastinal or hilar masses.  IMPRESSION: 1. Endotracheal tube is well positioned as is the nasal/oral gastric tube. 2. Small curl within the femoral a placed Swan-Ganz catheter. See above description. 3. Pulmonary edema.  This is likely due to congestive heart failure.   Electronically Signed   By: Amie Portland M.D.   On: 2014-04-21 09:15     EKG Interpretation None      MDM   Final diagnoses:  ST elevation myocardial infarction involving left anterior descending (LAD) coronary artery    I personally performed the services described in this documentation, which was  scribed in my presence. The recorded information has been reviewed and is accurate.  CRITICAL CARE Performed by: Derwood Kaplan   Total critical care time: 32 min  Critical care time was exclusive of separately billable procedures and treating other patients.  Critical care was necessary to treat or prevent imminent or life-threatening deterioration.  Critical care was time spent personally by me on the following activities: development of treatment plan with patient and/or surrogate as well as nursing, discussions with consultants, evaluation of patient's response to treatment, examination of patient, obtaining history from patient or surrogate, ordering and performing treatments and interventions, ordering and review of laboratory studies, ordering and review of radiographic studies, pulse oximetry and re-evaluation of patient's condition.   Pt comes in as CODE STEMI. He is having anterior and inferior STEMI. Cards at bedside. Pt is aox3, BP has dropped slightly - last BP - 92/75 and 85/69. Cards taking over care.    Derwood Kaplan, MD 04/02/14 [phone]

## 2014-04-01 NOTE — CV Procedure (Signed)
   Echocardiographic Adjustment of Impella  Shortly after arriving at the CCU, the echo tech was present and echocardiogram was performed to visualize placement of the Impella support device in the left ventricle. Several adjustments were made and monitored for function of the device. Actually the catheter was set at a depth of 89 cm at the hub of the adjustment [place].  During this period, additional  clinical decision-making requiring anticoagulation adjustment sedation and hydration adjustment.    The patient is critically ill cardiac shock and likely multiorgan system failure, requiring extensive manipulations and decision-making. Following the Procedure has been a total of 1 hour with the patient performing critical care.      Marykay LexHARDING, Kyona Chauncey W, M.D., M.S. Interventional Cardiologist   Pager # [phone]
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
## 2014-04-01 NOTE — Progress Notes (Signed)
Orthopedic Tech Progress Note Patient Details:  Micheal FoundsFrederick Boling 12/02/1955 [id number] Delivered to room to be applied by nursing staff after procedure is complete Ortho Devices Type of Ortho Device: Knee Immobilizer Ortho Device/Splint Location: LLE Ortho Device/Splint Interventions: Ordered   [name] 03/07/2014, 7:13 AM

## 2014-04-01 NOTE — Progress Notes (Signed)
MD aware sats not reading accurately from the O2 probe.  ABG oxygen saturations have been 100%.  Will continue to monitor via ABG's per order.  Micheal Ayala, Micheal Helle Moore, RN

## 2014-04-01 NOTE — Progress Notes (Signed)
Utilization review completed.  

## 2014-04-01 NOTE — Progress Notes (Signed)
[facility].  Family escorted to waiting area then to Cath lab waiting.  RN updated family at approx 0250.  Pt lives in [place] and was in town for his mother's funeral.  Pt's daughter, brother and brother in law in waiting area.  Pt's spouse still in [place] receiving updates via phone from daughter.  Pt's brother's spouse recently lost cousin and sibling.  [facility], 2H secretary informed as well as cath lab staff.  Chaplain provided emotional and spiritual support as well as the ministry of presence, prayer and empathetic listening.  Chaplain to follow up as needed.    January 26, 2015 0150  Clinical Encounter Type  Visited With Patient;Family;Health care provider  Visit Type Initial;Spiritual support;Patient in surgery;Code;Critical Care;ED  Referral From Care management  Spiritual Encounters  Spiritual Needs Prayer;Emotional  Stress Factors  Patient Stress Factors Family relationships;Health changes  Family Stress Factors Exhausted;Family relationships;Health changes   Erroll LunaOvercash, Ayaana Biondo A, Chaplain. 2014-05-30 4:09 AM

## 2014-04-01 NOTE — Progress Notes (Addendum)
ANTICOAGULATION CONSULT NOTE - Initial Consult  Pharmacy Consult for aggrastat and Heparin for impella Indication: s/p pci  Allergies no known allergies  Patient Measurements: Height: 5\' 11"  (180.3 cm) Weight: 218 lb 7.6 oz (99.1 kg) IBW/kg (Calculated) : 75.3 Heparin Dosing Weight:   Vital Signs: BP: 67/50 mmHg (01/31 0151) Pulse Rate: 115 (01/31 0202)  Labs:  Recent Labs  03/05/2014 0142  HGB 15.0  HCT 43.0  PLT 279  APTT 138*  LABPROT 15.1  INR 1.18  CREATININE 1.37*    Estimated Creatinine Clearance: 70.5 mL/min (by C-G formula based on Cr of 1.37).   Medical History: Past Medical History  Diagnosis Date  . MI (myocardial infarction)     Medications:  No prescriptions prior to admission    Assessment: 59 yo male in town from [place] for his mothers funeral with previous stemi hx from 2002 with stenting. Hx htn and long term smoker with HLD . Anterior and inferior MI with cardiogenic shock. Impella 3.5 placed and aggrastat to continue post cath 12 hours (turn off ~6pm)  Goal of Therapy:  aptt 160-180   Monitor platelets by anticoagulation protocol: Yes   Plan:   continue aggrastat at 0.15 mcg/kg/min until 1800 (CBC at noon)  F/u aPTT levels to initiate systemic heparin when appropriate.   Tharon AquasEarl, Subhan Hoopes Jonathan   Chantrice Hagg Jonathan 03/13/2014,7:10 AM    7:51 AM While confirming choice of purge solution (d20 with 50 units/ml hep vs d5w 50units/ml)  at bedside initial ACT on floor is 132 sec. Right leg difficult to obtain pulses via doppler and cold to touch. Dr harding informed me that patient required 8000 unit heparin bolus in cath lab due to resistance to angiomax. intial act after heparin  bolus 1000 sec  and has now drifted from 200 sec ~0645 to 132 sec  now. Giving 4000 unit bolus and 900 unit per hr basal rate until purge solution with heparin can be compounded. Blood from NG tube now ( most likely due to rough intubation so Dr Herbie Baltimoreharding wants  aggrastat to be dc'ed at 1200 pm. Will adjust Heparin with act 2 hours after purge solution obtained if required. There is no impella 3.5 order set so suggested that we enter the 5.0 order set for parameters.    Janice CoffinEarl, Amberley Hamler Jonathan

## 2014-04-01 NOTE — ED Notes (Signed)
Patient here with complaint of chest pain with onset about 2 hours ago. MI history in 2002. 10mg  morphine, asa 324mg , 4 zofran, 0.4mg  SL NTG given by EMS.

## 2014-04-01 NOTE — Progress Notes (Addendum)
  59 y/o male with h/o CAD admitted with anterolateral and inferior STEMI due to stent occlusion. Course complicated by profound cardiogenic shock with Impella placement.  I was asked to see patient due to ongoing shock despite Impella support.   I reviewed cath films and also evaluated Impella placement under direct u/s visualization.   He is awake and responsive on vent. Able to answer questions. Remains on norepi 5 and Impella on P-8 with MAP 95. However arterial waveform is damped due to poor LV pulsatility. LVEF looks to be about 10%. RV not well visualized but PA tracing has good pulsatility. Flow on Impella ~2.5 l/min. Urine output marginal.   Swan numbers with CVP 15 PA 31/12 PCWP 15 CO ~2.0 l/min SVR 3200.   Assessment/Plan: He is critically ill with profound cardiogenic shock and very poor native heart function. Will continue to support with Impella but will also need to be aggressive with inotropic support and keeping CVP up. Will start milrinone and titrate norepi as tolerated. May need TEE to get better view of RV and Impella position to determine if he will be candidate for permanent LVAD or other advanced therapies. Will check co-ox.   The advanced HF team will assume care.   The patient is critically ill with multiple organ systems failure and requires high complexity decision making for assessment and support, frequent evaluation and titration of therapies, application of advanced monitoring technologies and extensive interpretation of multiple databases.   Critical Care Time devoted to patient care services described in this note is 60 Minutes.   Selmer Adduci,MD 11:38 AM

## 2014-04-01 NOTE — Progress Notes (Signed)
Patient's HR sustained 130's-140's. CVP 15. Dr. Gala RomneyBensimhon called and made aware. New orders for 250cc NS bolus and stat labs. Will continue to monitor.

## 2014-04-01 NOTE — Consult Note (Signed)
PULMONARY / CRITICAL CARE MEDICINE   Name: Micheal Ayala MRN: [id number] DOB: [birth year]    ADMISSION DATE:  03/28/2014 CONSULTATION DATE:  03/18/2014  REFERRING MD :  Dr. Herbie Baltimore  CHIEF COMPLAINT:  STEMI  INITIAL PRESENTATION: 59M smoker male visiting [place] from [place] with HTN, CAD s/p STEMI 2002, presenting to ED via EMS as code STEMI with anterior and inferior STEMI. Initially hemodynically stable in the field, then severe respiratory distress on arrival to ED and hypotensive. Started on pressors. Taken emergently to cath lab for STEMI and cardiogenic shock. PCCM consulted for respiratory distress and vent management.   STUDIES:  1/31 Cath and PCI: Severe multivessel disease with occluded LAD and Codominant Circumflex with severe disease in the RCA and OM1.  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  Cardiac function currently being supported by 3.5 mL Impella.  Successful 2 vessel PCI of the early mid LAD and mid circumflex into OM 2 with Synergy DES stents.  SIGNIFICANT EVENTS: 1/31: Chest pain x2 hours, called EMS, code STEMI, hypotensive and resp distress on arrival to ED, taken emergently to cath lab, 2 vessel PCI of LAD and Circumflex, Impella insertion, on pressors, remains intubated.   HISTORY OF PRESENT ILLNESS: Obtained from chart review.  Mr. Micheal Ayala is a 59 year old smoker male with PMH of HTN, CAD s/p STEMI (2002), and HLD who is visiting [place] from [place] for his mother's funeral.  Noted to have severe chest pain radiating to left arm early this morning for approximately 2 hours and then called EMS.  On their arrival, hemodynamically stable but ECG demonstrated anterior and inferior ST elevations.  Code STEMI was activated.  He received morphine, ASA, Zofran, and NTG.  On arrival to ED, developed severe respiratory distress, transitioned to NRB, and eventually intubated.  He was also hypotensive and  started on Levophed. Taken emergently to cath lab where he was found to have severe multivessel disease.   PAST MEDICAL HISTORY :   has a past medical history of MI (myocardial infarction).  has no past surgical history on file. Prior to Admission medications   Not on File   No Known Allergies  FAMILY HISTORY:  has no family status information on file.  SOCIAL HISTORY:  reports that he has been smoking Cigarettes.  He has been smoking about 1.00 pack per day. He does not have any smokeless tobacco history on file.  REVIEW OF SYSTEMS:  Unable to obtain as patient is currently on mechanical ventilation  SUBJECTIVE: Lying in bed, arousable, following commands, on mechanical ventilation  VITAL SIGNS: Temp:  [98.2 F (36.8 C)-99.9 F (37.7 C)] 99.9 F (37.7 C) (01/31 1030) Pulse Rate:  [25-115] 106 (01/31 0930) Resp:  [0-28] 20 (01/31 1030) BP: (57-113)/(14-76) 113/64 mmHg (01/31 0830) SpO2:  [59 %-99 %] 61 % (01/31 0930) FiO2 (%):  [100 %] 100 % (01/31 0920) Weight:  [218 lb 7.6 oz (99.1 kg)] 218 lb 7.6 oz (99.1 kg) (01/31 0700) HEMODYNAMICS: PAP: (28-36)/(18-28) 28/18 mmHg CVP:  [11 mmHg-20 mmHg] 11 mmHg PCWP:  [21 mmHg] 21 mmHg CO:  [2 L/min] 2 L/min CI:  [1.8 L/min/m2] 1.8 L/min/m2 VENTILATOR SETTINGS: Vent Mode:  [-] PRVC FiO2 (%):  [100 %] 100 % Set Rate:  [16 bmp-22 bmp] 22 bmp Vt Set:  [600 mL] 600 mL PEEP:  [10 cmH20] 10 cmH20 Plateau Pressure:  [26 cmH20-28 cmH20] 26 cmH20 INTAKE / OUTPUT:  Intake/Output Summary (Last  24 hours) at 03/14/2014 1053 Last data filed at 03/29/2014 1000  Gross per 24 hour  Intake 186.75 ml  Output    312 ml  Net -125.25 ml   PHYSICAL EXAMINATION: General:  Lying in bed, on mechanical ventilation Neuro:  Rass -1, following commands when awake HEENT:  No eye on R, left eye with EOMI, ETT in place, large beard Cardiovascular:  Tachycardia Lungs:  Coarse b/l breath sounds, diminished at bases Abdomen:  Soft, obese, hypoactive bowel  sounds Musculoskeletal:  -edema, Impella on left groin, arterial sheath R groin, PIVs Skin:  Warm, multiple tattoos  LABS:  CBC  Recent Labs Lab 03/18/2014 0142  WBC 15.4*  HGB 15.0  HCT 43.0  PLT 279   Coag's  Recent Labs Lab 03/20/2014 0142  APTT 138*  INR 1.18   BMET  Recent Labs Lab 03/20/2014 0142  NA 131*  K 3.5  CL 104  CO2 17*  BUN 6  CREATININE 1.37*  GLUCOSE 226*   Electrolytes  Recent Labs Lab 03/11/2014 0142  CALCIUM 7.9*   ABG  Recent Labs Lab 03/11/2014 0648  PHART 7.379  PCO2ART 31.0*  PO2ART 242.0*   Glucose  Recent Labs Lab 03/14/2014 0739  GLUCAP 185*   ASSESSMENT / PLAN:  PULMONARY OETT 1/31>>> A: Acute respiratory failure Pulmonary edema P:   Vent bundle, current vent settings 600/22/10/70% Repeat ABG now that FIO2 down to 70% from 100 AM CXR AM ABG Would benefit from lasix if BP can tolerate, but hold for now given cath and hypotension, and AKI with unknown baseline  CARDIOVASCULAR R arterial sheath and swan 1/31>>> L Impella 1/31>>>  A: Cardiogenic shock--on pressors Tachycardia STEMI s/p Cath 1/31: severe multivessel disease with occluded LAD and Circumflex with severe disease in RCA and OM1.  S/p 2 vessel PCI of early mid LAD and mid circumflex. S/p Impella insertion.  Hx of CAD with prior STEMI Severe ischemic cardiomyopathy Severe systolic heart failure  P:  Cardiology managing Echo Heparin gtt Continue Impella support for now Aggrastat x6 hours then transition to Brilinta [employer] BB and ACEi for now On Levo CVP monitoring Continue statin, ASA CE cycling   RENAL A:  Hyponatremia AKI--unknown baseline Pulm edema P:   Trend renal function Check mag and phos On NS for now s/p cath Monitor I/O's Will eventually likely need lasix, holding for now  GASTROINTESTINAL A:  Hypoactive BS P:   NPO for now, TF likely to start tomorrow if remains intubated Protonix  HEMATOLOGIC A:  Leukocytosis--likely  reactive P:  Monitor Hb, s/p cath On Heparin gtt, Aggrastat, and Brilinta  INFECTIOUS A:  Afebrile P:   Monitor for fever  ENDOCRINE A:  Hyperglycemia Obesity Likely diabetic  P:   May need SSI CBG monitoring  NEUROLOGIC A:  Sedated on mechanical ventilation, following commands P:   RASS goal: -1 to -2 Fent and versed gtt   FAMILY  - Updates: No family by bedside this AM  - Inter-disciplinary family meet or Palliative Care meeting due by:  day 7   Signed: Darden Palmer, MD PGY-3, Internal Medicine Resident Pager: [phone]  03/14/2014,11:16 AM    STAFF NOTE: Cindi Carbon, MD FACP have personally reviewed patient's available data, including medical history, events of note, physical examination and test results as part of my evaluation. I have discussed with resident/NP and other care providers such as pharmacist, RN and RRT. In addition, I personally evaluated patient and elicited key findings of: rass deep, pcxr c/w  pulm edema, will be peep responsive, keep samev on vent, to goal 40% then to peep 8 if able, abg follow up, with continued levophed and empella would hold off lasix and vent is improcing, would keep rass -3 with device, neg balance will become important once hemodynamics are improved, pcxr in am  The patient is critically ill with multiple organ systems failure and requires high complexity decision making for assessment and support, frequent evaluation and titration of therapies, application of advanced monitoring technologies and extensive interpretation of multiple databases.   Critical Care Time devoted to patient care services described in this note is 30 Minutes. This time reflects time of care of this signee: Rory Percyaniel Feinstein, MD FACP. This critical care time does not reflect procedure time, or teaching time or supervisory time of PA/NP/Med student/Med Resident etc but could involve care discussion time. Rest per NP/medical resident whose note is  outlined above and that I agree with   Mcarthur Rossettianiel J. Tyson [name], MD, FACP Pgr: [phone] [facility] 03/26/2014 3:37 PM
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
## 2014-04-01 NOTE — Progress Notes (Signed)
ANTICOAGULATION CONSULT NOTE - Follow-up Consult  Pharmacy Consult for Aggrastat and Heparin for Impella Indication: s/p pci  No Known Allergies  Patient Measurements: Height: 5\' 11"  (180.3 cm) Weight: 218 lb 7.6 oz (99.1 kg) IBW/kg (Calculated) : 75.3  Vital Signs: Temp: 100.4 F (38 C) (01/31 1430) Temp Source: Core (Comment) (01/31 1200) BP: 108/49 mmHg (01/31 1430) Pulse Rate: 53 (01/31 1430)  Labs:  Recent Labs  03/28/2014 0142 03/27/2014 1200 03/06/2014 1207  HGB 15.0  --  13.2  HCT 43.0  --  38.6*  PLT 279  --  243  APTT 138*  --   --   LABPROT 15.1  --   --   INR 1.18  --   --   CREATININE 1.37* 1.11  --   TROPONINI  --   --  >80.00*    Estimated Creatinine Clearance: 87 mL/min (by C-G formula based on Cr of 1.11).   Medical History: Past Medical History  Diagnosis Date  . MI (myocardial infarction)     Medications:  No prescriptions prior to admission    Assessment: 59 yo male in town from [place] for his mother's funeral with previous STEMI hx from 2002 with stenting. Hx HTN and long term smoker with HLD . Anterior and inferior MI with cardiogenic shock. Impella 3.5 placed and Aggrastat to continue post cath 12 hours per Dr. Gala RomneyBensimhon d/t extensive clot in coronaries (turn off 6pm).   CBC at noon: RBC and plt wnl  ACTs: 208 (expected to be high d/t heparin bolus), 177, 171, 171.  Purge infusion volume consistently at 13.9 ml/hr  Goal of Therapy:  ACT 160-180   Monitor platelets by anticoagulation protocol: Yes   Plan:  - Continue Aggrastat at 0.15 mcg/kg/min until 1800  - Heparin remains running @ 900 units/hr with ACTs consistently within goal  Megan E. Supple, Pharm.D Clinical Pharmacy Resident Pager: [phone] 03/24/2014 3:36 PM

## 2014-04-02 ENCOUNTER — Inpatient Hospital Stay (HOSPITAL_COMMUNITY): Payer: Medicare Other

## 2014-04-02 DIAGNOSIS — I509 Heart failure, unspecified: Secondary | ICD-10-CM

## 2014-04-02 DIAGNOSIS — I2510 Atherosclerotic heart disease of native coronary artery without angina pectoris: Secondary | ICD-10-CM

## 2014-04-02 LAB — URINE MICROSCOPIC-ADD ON

## 2014-04-02 LAB — COMPREHENSIVE METABOLIC PANEL
ALT: 133 U/L — AB (ref 0–53)
AST: 435 U/L — ABNORMAL HIGH (ref 0–37)
Albumin: 2 g/dL — ABNORMAL LOW (ref 3.5–5.2)
Alkaline Phosphatase: 102 U/L (ref 39–117)
Anion gap: 3 — ABNORMAL LOW (ref 5–15)
BILIRUBIN TOTAL: 2.9 mg/dL — AB (ref 0.3–1.2)
BUN: 11 mg/dL (ref 6–23)
CALCIUM: 7.2 mg/dL — AB (ref 8.4–10.5)
CO2: 19 mmol/L (ref 19–32)
CREATININE: 1.18 mg/dL (ref 0.50–1.35)
Chloride: 112 mmol/L (ref 96–112)
GFR calc Af Amer: 77 mL/min — ABNORMAL LOW (ref >90)
GFR calc non Af Amer: 66 mL/min — ABNORMAL LOW (ref >90)
Glucose, Bld: 233 mg/dL — ABNORMAL HIGH (ref 70–99)
Potassium: 4.3 mmol/L (ref 3.5–5.1)
Sodium: 134 mmol/L — ABNORMAL LOW (ref 135–145)
Total Protein: 5 g/dL — ABNORMAL LOW (ref 6.0–8.3)

## 2014-04-02 LAB — GLUCOSE, CAPILLARY
GLUCOSE-CAPILLARY: 216 mg/dL — AB (ref 70–99)
GLUCOSE-CAPILLARY: 206 mg/dL — AB (ref 70–99)
Glucose-Capillary: 212 mg/dL — ABNORMAL HIGH (ref 70–99)
Glucose-Capillary: 216 mg/dL — ABNORMAL HIGH (ref 70–99)
Glucose-Capillary: 221 mg/dL — ABNORMAL HIGH (ref 70–99)

## 2014-04-02 LAB — BLOOD GAS, ARTERIAL
ACID-BASE DEFICIT: 7.6 mmol/L — AB (ref 0.0–2.0)
BICARBONATE: 16.3 meq/L — AB (ref 20.0–24.0)
Drawn by: 24513
FIO2: 0.6 %
MECHVT: 600 mL
O2 Saturation: 99 %
PEEP: 10 cmH2O
Patient temperature: 98.8
RATE: 22 resp/min
TCO2: 17.1 mmol/L (ref 0–100)
pCO2 arterial: 27.3 mmHg — ABNORMAL LOW (ref 35.0–45.0)
pH, Arterial: 7.394 (ref 7.350–7.450)
pO2, Arterial: 162 mmHg — ABNORMAL HIGH (ref 80.0–100.0)

## 2014-04-02 LAB — CBC WITH DIFFERENTIAL/PLATELET
BASOS ABS: 0 10*3/uL (ref 0.0–0.1)
Basophils Relative: 0 % (ref 0–1)
Eosinophils Absolute: 0 10*3/uL (ref 0.0–0.7)
Eosinophils Relative: 0 % (ref 0–5)
HEMATOCRIT: 33.7 % — AB (ref 39.0–52.0)
Hemoglobin: 11.6 g/dL — ABNORMAL LOW (ref 13.0–17.0)
LYMPHS ABS: 2.8 10*3/uL (ref 0.7–4.0)
LYMPHS PCT: 14 % (ref 12–46)
MCH: 30.4 pg (ref 26.0–34.0)
MCHC: 34.4 g/dL (ref 30.0–36.0)
MCV: 88.5 fL (ref 78.0–100.0)
MONOS PCT: 10 % (ref 3–12)
Monocytes Absolute: 1.9 10*3/uL — ABNORMAL HIGH (ref 0.1–1.0)
NEUTROS ABS: 15.1 10*3/uL — AB (ref 1.7–7.7)
NEUTROS PCT: 76 % (ref 43–77)
Platelets: 242 10*3/uL (ref 150–400)
RBC: 3.81 MIL/uL — ABNORMAL LOW (ref 4.22–5.81)
RDW: 13.4 % (ref 11.5–15.5)
WBC: 19.8 10*3/uL — AB (ref 4.0–10.5)

## 2014-04-02 LAB — CARBOXYHEMOGLOBIN
CARBOXYHEMOGLOBIN: 0.7 % (ref 0.5–1.5)
CARBOXYHEMOGLOBIN: 1 % (ref 0.5–1.5)
Carboxyhemoglobin: 1 % (ref 0.5–1.5)
METHEMOGLOBIN: 1 % (ref 0.0–1.5)
Methemoglobin: 0.9 % (ref 0.0–1.5)
Methemoglobin: 0.8 % (ref 0.0–1.5)
O2 SAT: 61.8 %
O2 Saturation: 48.6 %
O2 Saturation: 61.7 %
TOTAL HEMOGLOBIN: 11.4 g/dL — AB (ref 13.5–18.0)
TOTAL HEMOGLOBIN: 10.2 g/dL — AB (ref 13.5–18.0)
Total hemoglobin: 11.1 g/dL — ABNORMAL LOW (ref 13.5–18.0)

## 2014-04-02 LAB — URINALYSIS, ROUTINE W REFLEX MICROSCOPIC
Glucose, UA: NEGATIVE mg/dL
KETONES UR: 15 mg/dL — AB
NITRITE: POSITIVE — AB
Protein, ur: 30 mg/dL — AB
SPECIFIC GRAVITY, URINE: 1.025 (ref 1.005–1.030)
Urobilinogen, UA: 2 mg/dL — ABNORMAL HIGH (ref 0.0–1.0)
pH: 5.5 (ref 5.0–8.0)

## 2014-04-02 LAB — APTT: aPTT: 77 seconds — ABNORMAL HIGH (ref 24–37)

## 2014-04-02 LAB — CBC
HEMATOCRIT: 29.1 % — AB (ref 39.0–52.0)
HEMOGLOBIN: 10 g/dL — AB (ref 13.0–17.0)
MCH: 29.7 pg (ref 26.0–34.0)
MCHC: 34.4 g/dL (ref 30.0–36.0)
MCV: 86.4 fL (ref 78.0–100.0)
Platelets: 201 10*3/uL (ref 150–400)
RBC: 3.37 MIL/uL — ABNORMAL LOW (ref 4.22–5.81)
RDW: 13.5 % (ref 11.5–15.5)
WBC: 18.1 10*3/uL — ABNORMAL HIGH (ref 4.0–10.5)

## 2014-04-02 LAB — MAGNESIUM: Magnesium: 1.6 mg/dL (ref 1.5–2.5)

## 2014-04-02 LAB — POCT ACTIVATED CLOTTING TIME
ACTIVATED CLOTTING TIME: 165 s
ACTIVATED CLOTTING TIME: 165 s
ACTIVATED CLOTTING TIME: 165 s
Activated Clotting Time: 165 seconds
Activated Clotting Time: 159 seconds
Activated Clotting Time: 153 seconds
Activated Clotting Time: 159 seconds
Activated Clotting Time: 165 seconds

## 2014-04-02 LAB — LACTATE DEHYDROGENASE: LDH: 2125 U/L — ABNORMAL HIGH (ref 94–250)

## 2014-04-02 LAB — ABO/RH: ABO/RH(D): O POS

## 2014-04-02 LAB — PHOSPHORUS: PHOSPHORUS: 1.8 mg/dL — AB (ref 2.3–4.6)

## 2014-04-02 LAB — PREPARE RBC (CROSSMATCH)

## 2014-04-02 MED ORDER — PRO-STAT SUGAR FREE PO LIQD
60.0000 mL | Freq: Four times a day (QID) | ORAL | Status: DC
  Administered 2014-04-02 – 2014-04-12 (×35): 60 mL
  Filled 2014-04-02 (×42): qty 60

## 2014-04-02 MED ORDER — ADULT MULTIVITAMIN W/MINERALS CH
1.0000 | ORAL_TABLET | Freq: Every day | ORAL | Status: DC
  Administered 2014-04-02 – 2014-04-04 (×3): 1
  Filled 2014-04-02 (×3): qty 1

## 2014-04-02 MED ORDER — VANCOMYCIN HCL 10 G IV SOLR
1250.0000 mg | Freq: Once | INTRAVENOUS | Status: AC
  Administered 2014-04-02: 1250 mg via INTRAVENOUS
  Filled 2014-04-02 (×2): qty 1250

## 2014-04-02 MED ORDER — VITAL HIGH PROTEIN PO LIQD
1000.0000 mL | ORAL | Status: DC
  Administered 2014-04-02: 1000 mL
  Administered 2014-04-03: 08:00:00
  Administered 2014-04-03 – 2014-04-11 (×7): 1000 mL
  Filled 2014-04-02 (×11): qty 1000

## 2014-04-02 MED ORDER — INSULIN ASPART 100 UNIT/ML ~~LOC~~ SOLN
0.0000 [IU] | SUBCUTANEOUS | Status: DC
  Administered 2014-04-02 (×4): 5 [IU] via SUBCUTANEOUS
  Administered 2014-04-03: 8 [IU] via SUBCUTANEOUS
  Administered 2014-04-03: 3 [IU] via SUBCUTANEOUS
  Administered 2014-04-03: 5 [IU] via SUBCUTANEOUS
  Administered 2014-04-03: 8 [IU] via SUBCUTANEOUS
  Administered 2014-04-03 – 2014-04-04 (×5): 5 [IU] via SUBCUTANEOUS
  Administered 2014-04-04 (×2): 3 [IU] via SUBCUTANEOUS
  Administered 2014-04-04: 8 [IU] via SUBCUTANEOUS
  Administered 2014-04-05 (×5): 2 [IU] via SUBCUTANEOUS
  Administered 2014-04-05: 3 [IU] via SUBCUTANEOUS
  Administered 2014-04-06 – 2014-04-08 (×10): 2 [IU] via SUBCUTANEOUS
  Administered 2014-04-09: 3 [IU] via SUBCUTANEOUS
  Administered 2014-04-09: 100 [IU] via SUBCUTANEOUS
  Administered 2014-04-09 – 2014-04-10 (×6): 2 [IU] via SUBCUTANEOUS
  Administered 2014-04-10 – 2014-04-11 (×3): 3 [IU] via SUBCUTANEOUS
  Administered 2014-04-11: 2 [IU] via SUBCUTANEOUS
  Administered 2014-04-11 (×2): 3 [IU] via SUBCUTANEOUS
  Administered 2014-04-11 – 2014-04-13 (×8): 2 [IU] via SUBCUTANEOUS
  Administered 2014-04-13: 3 [IU] via SUBCUTANEOUS
  Administered 2014-04-13: 8 [IU] via SUBCUTANEOUS
  Administered 2014-04-13 (×2): 2 [IU] via SUBCUTANEOUS
  Administered 2014-04-13 – 2014-04-14 (×5): 3 [IU] via SUBCUTANEOUS
  Administered 2014-04-15: 2 [IU] via SUBCUTANEOUS
  Administered 2014-04-15: 5 [IU] via SUBCUTANEOUS
  Administered 2014-04-15 – 2014-04-16 (×2): 2 [IU] via SUBCUTANEOUS
  Administered 2014-04-16 (×3): 3 [IU] via SUBCUTANEOUS
  Administered 2014-04-16: 2 [IU] via SUBCUTANEOUS
  Administered 2014-04-16 – 2014-04-17 (×3): 3 [IU] via SUBCUTANEOUS
  Administered 2014-04-17 – 2014-04-18 (×3): 2 [IU] via SUBCUTANEOUS
  Administered 2014-04-18: 3 [IU] via SUBCUTANEOUS
  Administered 2014-04-18 – 2014-04-20 (×8): 2 [IU] via SUBCUTANEOUS
  Administered 2014-04-20 (×2): 3 [IU] via SUBCUTANEOUS
  Administered 2014-04-21 (×2): 2 [IU] via SUBCUTANEOUS
  Administered 2014-04-21 (×2): 3 [IU] via SUBCUTANEOUS
  Administered 2014-04-21: 5 [IU] via SUBCUTANEOUS
  Administered 2014-04-22 (×2): 2 [IU] via SUBCUTANEOUS
  Administered 2014-04-22 – 2014-04-23 (×5): 3 [IU] via SUBCUTANEOUS
  Administered 2014-04-23 – 2014-04-24 (×5): 2 [IU] via SUBCUTANEOUS
  Administered 2014-04-24: 3 [IU] via SUBCUTANEOUS
  Administered 2014-04-24: 2 [IU] via SUBCUTANEOUS
  Administered 2014-04-24: 3 [IU] via SUBCUTANEOUS
  Administered 2014-04-24 – 2014-04-25 (×2): 2 [IU] via SUBCUTANEOUS
  Administered 2014-04-25: 3 [IU] via SUBCUTANEOUS
  Administered 2014-04-25: 2 [IU] via SUBCUTANEOUS
  Administered 2014-04-25 (×2): 3 [IU] via SUBCUTANEOUS
  Administered 2014-04-26 (×3): 2 [IU] via SUBCUTANEOUS
  Administered 2014-04-27 (×2): 3 [IU] via SUBCUTANEOUS
  Administered 2014-04-27 (×3): 2 [IU] via SUBCUTANEOUS
  Administered 2014-04-27 (×2): 3 [IU] via SUBCUTANEOUS
  Administered 2014-04-28 (×2): 2 [IU] via SUBCUTANEOUS
  Administered 2014-04-28 (×3): 3 [IU] via SUBCUTANEOUS
  Administered 2014-04-29: 2 [IU] via SUBCUTANEOUS
  Administered 2014-04-29 (×2): 3 [IU] via SUBCUTANEOUS
  Administered 2014-04-29 (×2): 2 [IU] via SUBCUTANEOUS
  Administered 2014-04-30: 11 [IU] via SUBCUTANEOUS
  Administered 2014-04-30: 5 [IU] via SUBCUTANEOUS
  Administered 2014-04-30: 2 [IU] via SUBCUTANEOUS
  Administered 2014-04-30: 3 [IU] via SUBCUTANEOUS
  Administered 2014-04-30: 5 [IU] via SUBCUTANEOUS
  Administered 2014-04-30: 3 [IU] via SUBCUTANEOUS
  Administered 2014-04-30: 2 [IU] via SUBCUTANEOUS
  Administered 2014-05-01 (×4): 3 [IU] via SUBCUTANEOUS
  Administered 2014-05-01: 8 [IU] via SUBCUTANEOUS
  Administered 2014-05-01: 3 [IU] via SUBCUTANEOUS
  Administered 2014-05-02: 2 [IU] via SUBCUTANEOUS

## 2014-04-02 MED ORDER — VANCOMYCIN HCL 10 G IV SOLR
1250.0000 mg | Freq: Two times a day (BID) | INTRAVENOUS | Status: DC
  Administered 2014-04-02 – 2014-04-03 (×4): 1250 mg via INTRAVENOUS
  Filled 2014-04-02 (×5): qty 1250

## 2014-04-02 MED ORDER — MAGNESIUM SULFATE 4 GM/100ML IV SOLN
4.0000 g | Freq: Once | INTRAVENOUS | Status: AC
Start: 2014-04-02 — End: 2014-04-02
  Administered 2014-04-02: 4 g via INTRAVENOUS
  Filled 2014-04-02: qty 100

## 2014-04-02 MED ORDER — SODIUM CHLORIDE 0.9 % IV SOLN
Freq: Once | INTRAVENOUS | Status: AC
  Administered 2014-04-02: 22:00:00 via INTRAVENOUS

## 2014-04-02 MED ORDER — VITAL HIGH PROTEIN PO LIQD
1000.0000 mL | ORAL | Status: DC
  Filled 2014-04-02 (×3): qty 1000

## 2014-04-02 MED ORDER — SODIUM CHLORIDE 0.9 % IV SOLN
INTRAVENOUS | Status: DC
  Administered 2014-04-02 – 2014-04-03 (×2): via INTRAVENOUS

## 2014-04-02 MED ORDER — INSULIN GLARGINE 100 UNIT/ML ~~LOC~~ SOLN
15.0000 [IU] | Freq: Every day | SUBCUTANEOUS | Status: DC
  Administered 2014-04-02 – 2014-04-03 (×2): 15 [IU] via SUBCUTANEOUS
  Filled 2014-04-02 (×2): qty 0.15

## 2014-04-02 MED ORDER — PANTOPRAZOLE SODIUM 40 MG PO PACK
40.0000 mg | PACK | Freq: Every day | ORAL | Status: DC
  Administered 2014-04-03 – 2014-04-04 (×2): 40 mg
  Filled 2014-04-02 (×2): qty 20

## 2014-04-02 MED FILL — Sodium Chloride IV Soln 0.9%: INTRAVENOUS | Qty: 50 | Status: AC

## 2014-04-02 NOTE — Progress Notes (Signed)
Dr. Gala RomneyBensimhon notified of coffee-ground contents in patient's NG tube.  New orders received.  Will continue to monitor. [place], Mitzi HansenJessica Marie

## 2014-04-02 NOTE — Progress Notes (Signed)
PULMONARY / CRITICAL CARE MEDICINE   Name: Micheal Ayala MRN: [id number] DOB: 12-20-1955    ADMISSION DATE:  04-13-2014 CONSULTATION DATE:  04-13-14  REFERRING MD :  Dr. Herbie Baltimore  CHIEF COMPLAINT:  STEMI  INITIAL PRESENTATION: 69M smoker male visiting [place] from [place] with HTN, CAD s/p STEMI 2002, presenting to ED via EMS as code STEMI with anterior and inferior STEMI. Initially hemodynically stable in the field, then severe respiratory distress on arrival to ED and hypotensive. Started on pressors. Taken emergently to cath lab for STEMI and cardiogenic shock. PCCM consulted for respiratory distress and vent management.   STUDIES:  1/31 Cath and PCI: Severe multivessel disease with occluded LAD and Codominant Circumflex with severe disease in the RCA and OM1.  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  Cardiac function currently being supported by 3.5 mL Impella.  Successful 2 vessel PCI of the early mid LAD and mid circumflex into OM 2 with Synergy DES stents. 1/31 Echo > LVEF 20-25%, severely reduced systolic function  SIGNIFICANT EVENTS: 1/31: Chest pain x2 hours, called EMS, code STEMI, hypotensive and resp distress on arrival to ED, taken emergently to cath lab, 2 vessel PCI of LAD and Circumflex, Impella insertion, on pressors, remains intubated.    SUBJECTIVE: Making some urine, remains on impella, oxygenation improving  VITAL SIGNS: Temp:  [98.8 F (37.1 C)-100.9 F (38.3 C)] 98.8 F (37.1 C) (02/01 1100) Pulse Rate:  [25-139] 124 (02/01 1100) Resp:  [0-25] 22 (02/01 0900) BP: (39-108)/(17-72) 74/59 mmHg (02/01 1100) SpO2:  [68 %-71 %] 68 % (02/01 0300) FiO2 (%):  [40 %-60 %] 50 % (02/01 1142) Weight:  [103.8 kg (228 lb 13.4 oz)] 103.8 kg (228 lb 13.4 oz) (02/01 0351) HEMODYNAMICS: PAP: (20-28)/(15-21) 22/16 mmHg CVP:  [11 mmHg-18 mmHg] 14 mmHg PCWP:  [19 mmHg-20 mmHg] 19 mmHg CO:  [2.9  L/min-3.2 L/min] 3.2 L/min CI:  [1.3 L/min/m2-1.5 L/min/m2] 1.5 L/min/m2 VENTILATOR SETTINGS: Vent Mode:  [-] PRVC FiO2 (%):  [40 %-60 %] 50 % Set Rate:  [22 bmp] 22 bmp Vt Set:  [600 mL] 600 mL PEEP:  [8 cmH20-10 cmH20] 10 cmH20 Plateau Pressure:  [23 cmH20-26 cmH20] 26 cmH20 INTAKE / OUTPUT:  Intake/Output Summary (Last 24 hours) at 04/02/14 1201 Last data filed at 04/02/14 1100  Gross per 24 hour  Intake 4814.16 ml  Output    713 ml  Net 4101.16 ml   PHYSICAL EXAMINATION: General:  Sedated on vent HEENT: NCAT ETT in place PULM; coarse crackles bases CV: mechanical sounds over heart AB: BS+, soft, nontender Ext: warm, no edema Neuro: sedated on vent awakens to touch  LABS:  CBC  Recent Labs Lab 04/13/14 1207 13-Apr-2014 2300 04/02/14 0333  WBC 13.0* 17.5* 19.8*  HGB 13.2 11.7* 11.6*  HCT 38.6* 34.7* 33.7*  PLT 243 237 242   Coag's  Recent Labs Lab 13-Apr-2014 0142 04/02/14 0333  APTT 138* 77*  INR 1.18  --    BMET  Recent Labs Lab 04/13/14 1200 04/13/14 2300 04/02/14 0333  NA 136 134* 134*  K 4.2 4.2 4.3  CL 112 110 112  CO2 BUN CREATININE 1.11 1.23 1.18  GLUCOSE 174* 221* 233*   Electrolytes  Recent Labs Lab April 13, 2014 1200 13-Apr-2014 2300 04/02/14 0333  CALCIUM 7.1* 7.3* 7.2*  MG  --   --  1.6  PHOS  --   --  1.8*  ABG  Recent Labs Lab 02/01/15 0924 02/01/15 1133 04/02/14 0305  PHART 7.347* 7.391 7.394  PCO2ART 28.3* 25.1* 27.3*  PO2ART 252.0* 195.0* 162.0*   Glucose  Recent Labs Lab 02/01/15 0739 02/01/15 1130 04/02/14 1114  GLUCAP 185* 163* 216*   ASSESSMENT / PLAN:  PULMONARY OETT 1/31>>> A: Acute respiratory failure due to cardiogenic shock> CXR 2/1 remarkably normal and improving P:   Continue full vent support Wean peep/FiO2 for SpO2 > 92% SBT/WUA daily  CARDIOVASCULAR R arterial sheath and swan 1/31>>> L Impella 1/31>>> A:  Cardiogenic shock post STEMI Cath 1/31: severe multivessel  disease with occluded LAD and Circumflex with severe disease in RCA and OM1.  S/p 2 vessel PCI of early mid LAD and mid circumflex. S/p Impella insertion.  Severe ischemic cardiomyopathy P:  Impella/milrinone/iontropes/Coox/CVP/anti-platelets per cardiology Tele Ideally would like net even to slightly negative  RENAL A:  Hyponatremia AKI--improving P:   Monitor BMET and UOP Replace electrolytes as needed  GASTROINTESTINAL A:  No acute issues P:   Start tube feedings Protonix  HEMATOLOGIC A:  No acute isues P:  Monitor Hb, s/p cath On Heparin gtt, Aggrastat, and Brilinta per cardiology  INFECTIOUS A:  Fever/Leukocytosis post MI 1/31, started on Vanc empirically per pharmacy notes, no blood culture sent P:    2/1 blood cx >> 1/31 vanc >>  For now would monitor leukocytosis and fever (both of which are not unexpected post large MI) and considering stopping vanc 2/2 or 2/3 if they normalize  ENDOCRINE A:  Hyperglycemia Likely diabetic  P:   Send Hgb A1c Add lantus given TF start today CBG monitoring  NEUROLOGIC A:  Sedated on mechanical ventilation, following commands P:   RASS goal: -1  Fent and versed gtt   FAMILY  - Updates: No family by bedside on my exam 2/1  - Inter-disciplinary family meet or Palliative Care meeting due by:  day 7   My cc time 40 minutes  Heber CarolinaBrent McQuaid, MD [place] PCCM Pager: [phone] Cell: [phone] If no response, call [phone]  04/02/2014 12:01 PM

## 2014-04-02 NOTE — Progress Notes (Signed)
INITIAL NUTRITION ASSESSMENT  DOCUMENTATION CODES Per approved criteria  -Not Applicable   INTERVENTION: Recommend checking Phosphorous lab; DI paged MD, no reply Initiate Vital HP at 72m/hr  Initiate 61mProStat QID Provides 1400 kcals (64% of needs), 172 grams Protein, 50149m20  NUTRITION DIAGNOSIS: Inadequate oral intake related to unable to eat as evidenced by NPO status.   Goal: Pt to meet >/= 90% of estimated energy needs; not met  Monitor:  TF tolerance, PO status, weight, labs   Reason for Assessment: Consult for enteral tube feeding  58 35o. male  Admitting Dx: ST elevation (STEMI) myocardial infarction involving left anterior descending coronary artery  ASSESSMENT: 58 4o male smoker visiting from [place] HTN, CAD s/p STEMI 2002, was presented to ED via EMS with severe chest pain, hypertension and shortness of breath. He underwent emergency left and right heart catheterization and placement of a Impella CP ventricular assist device via femoral artery. Occluded prior stents have been replaced with new stents. His condition is slowly stabilizing. Prior to admission, pt had fairly good exercise tolerance and normal activities of daily living.   Pt currently intubated on ventilator support. Minute ventilation was 12.7, temperature 37.2, and pt has OG tube in place. VHP has not been initiated.  Labs show low phosphorous, hypocalcemia and high CBGs. DI paged MD, no reply. Follow up on labs.  Nutrition Focused Physical Exam:  Subcutaneous Fat:  Orbital Region: WDL Upper Arm Region: WDL Thoracic and Lumbar Region: WDL  Muscle:  Temple Region: WDL Clavicle Bone Region: WDL Clavicle and Acromion Bone Region: WDL Scapular Bone Region: WDL Dorsal Hand: WDL Patellar Region: WDL Anterior Thigh Region: WDL Posterior Calf Region: WDL  Edema: not present  Height: Ht Readings from Last 1 Encounters:  03/31/2014 5' 11"  (1.803 m)    Weight: Wt Readings from Last  1 Encounters:  04/02/14 228 lb 13.4 oz (103.8 kg)    Ideal Body Weight: 172 lbs (78.2kg)  % Ideal Body Weight: 132%  Wt Readings from Last 10 Encounters:  04/02/14 228 lb 13.4 oz (103.8 kg)    Usual Body Weight: unknown  % Usual Body Weight: -  BMI:  Body mass index is 31.93 kg/(m^2). obese  Estimated Nutritional Needs: Kcal: 2200 kcal Protein: 155-175 grams Fluid: 1.8 L daily  Skin: intact  Diet Order: Diet NPO time specified  EDUCATION NEEDS: -No education needs identified at this time   Intake/Output Summary (Last 24 hours) at 04/02/14 1341 Last data filed at 04/02/14 1300  Gross per 24 hour  Intake 5129.32 ml  Output    748 ml  Net 4381.32 ml    Last BM: PTA  Labs:   Recent Labs Lab 03/18/2014 1200 03/20/2014 2300 04/02/14 0333  NA 136 134* 134*  K 4.2 4.2 4.3  CL 112 110 112  CO2 19 20 19   BUN 8 11 11   CREATININE 1.11 1.23 1.18  CALCIUM 7.1* 7.3* 7.2*  MG  --   --  1.6  PHOS  --   --  1.8*  GLUCOSE 174* 221* 233*    CBG (last 3)   Recent Labs  03/05/2014 1130 04/02/14 0853 04/02/14 1114  GLUCAP 163* 216* 216*    Scheduled Meds: . antiseptic oral rinse  7 mL Mouth Rinse QID  . aspirin  324 mg Oral Once  . aspirin  81 mg Per Tube Daily  . atorvastatin  80 mg Per Tube q1800  . chlorhexidine  15 mL Mouth Rinse BID  .  digoxin  0.25 mg Intravenous Daily  . feeding supplement (VITAL HIGH PROTEIN)  1,000 mL Per Tube Q24H  . impella catheter heparin 50 unit/mL  25,000 Units Intracatheter Q24H  . insulin aspart  0-15 Units Subcutaneous 6 times per day  . insulin glargine  15 Units Subcutaneous Daily  . [START ON 04/03/2014] pantoprazole sodium  40 mg Per Tube Daily  . sodium chloride  3 mL Intravenous Q12H  . ticagrelor  90 mg Per Tube BID  . vancomycin  1,250 mg Intravenous Q12H    Continuous Infusions: . sodium chloride 1,000 mL (03/24/2014 0700)  . sodium chloride 50 mL/hr at 04/02/14 1007  . fentaNYL infusion INTRAVENOUS 25 mcg/hr  (04/02/14 0700)  . heparin 1,000 Units/hr (04/02/14 0700)  . midazolam (VERSED) infusion 2 mg/hr (04/02/14 0700)  . milrinone 0.5 mcg/kg/min (04/02/14 1131)  . norepinephrine (LEVOPHED) Adult infusion 17 mcg/min (04/02/14 8921)    Past Medical History  Diagnosis Date  . MI (myocardial infarction)     Past Surgical History  Procedure Laterality Date  . Left heart catheterization with coronary angiogram Bilateral 03/05/2014    Procedure: LEFT HEART CATHETERIZATION WITH CORONARY ANGIOGRAM;  Surgeon: Leonie Man, MD;  Location: [facility] C[facility] LAB;  Service: Cardiovascular;  Laterality: Bilateral;  . Cardiac catheterization  03/31/2014    Procedure: CORONARY STENT INTERVENTION W/IMPELLA;  Surgeon: Leonie Man, MD;  Location: [facility] CATH LAB;  Service: Cardiovascular;;    Wynona Dove, MS Dietetic Intern Pager: [phone]

## 2014-04-02 NOTE — Progress Notes (Signed)
  Hemodynamics reviewed intermittently throughout the day at the bedside.   Cardiac output now up to ~4L/min on milrinone 0.5 and norepi 19. Urine output holding steady at 20-30cc/hr. CVP 15.   Impella waveform improving and beginning to show pulsatility. O2 sats well maintained on vent. Moderate amount of coffee-ground emesis in NGT. Hgb dropping slowly but steadily. Suspect possible hemolysis. Urine ok. Groin site ok.   Case discussed at length with Dr. Donata ClayVan Trigt and VAD team. Not felt to be good candidate for durable mechanical support at this point. He is improving slowly on current support. Recent co-ox encouraging. Will continue support.  Transfuse 1u RBCs and continue IV protonix.  Total CCT another 45 minutes in addition to time spent on rounds this am.   Micheal Haywardaniel Shandrell Boda,MD 8:44 PM

## 2014-04-02 NOTE — Care Management Note (Signed)
    Page 1 of 1   04/02/2014     9:53:17 AM CARE MANAGEMENT NOTE 04/02/2014  Patient:  Micheal Ayala,Micheal Ayala   Account Number:  [id number]  Date Initiated:  04/02/2014  Documentation initiated by:  Junius CreamerWELL,DEBBIE  Subjective/Objective Assessment:   adm w mi     Action/Plan:   lives w fam   Anticipated DC Date:     Anticipated DC Plan:  HOME W HOME HEALTH SERVICES      DC Planning Services  CM consult  Medication Assistance      Choice offered to / List presented to:             Status of service:   Medicare Important Message given?   (If response is "NO", the following Medicare IM given date fields will be blank) Date Medicare IM given:   Medicare IM given by:   Date Additional Medicare IM given:   Additional Medicare IM given by:    Discharge Disposition:    Per UR Regulation:  Reviewed for med. necessity/level of care/duration of stay  If discussed at Long Length of Stay Meetings, dates discussed:    Comments:  2/1 0952 debbie Corretta Munce rn,bsn pt on vent. have left brilinta 30day card in room. will cont to follow.
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
## 2014-04-02 NOTE — Progress Notes (Signed)
Inpatient Diabetes Program Recommendations  AACE/ADA: New Consensus Statement on Inpatient Glycemic Control (2013)  Target Ranges:  Prepandial:   less than 140 mg/dL      Peak postprandial:   less than 180 mg/dL (1-2 hours)      Critically ill patients:  140 - 180 mg/dL    Inpatient Diabetes Program Recommendations Insulin - Basal: Fasting glucose this am elevatted, however pt ventilated at this time. May need some basal esp if remains ventilated HgbA1C: Please check fo current control  Thank you, Lenor CoffinAnn Jeffre Enriques, RN, CNS, Diabetes Coordinator  Pager [phone]) 8:00 am to 10:00pm Office [phone]  8:1464m - 5:00 pm

## 2014-04-02 NOTE — Progress Notes (Signed)
Advanced Heart Failure Rounding Note   Subjective:    Awake on vent.   Remains on Impella. Flow 2.8. Also on levophed 15 and milrinone 0.375. Urine ouput about 30cc/hr. Renal function stable  Swan numbers done personally at bedside.  RA 15 PA  24/19 PCWP 19 Thermo CO/CI  3.1/1.4 SVR 1423   Objective:   Weight Range:  Vital Signs:   Temp:  [99 F (37.2 C)-100.9 F (38.3 C)] 99.1 F (37.3 C) (02/01 0800) Pulse Rate:  [25-139] 131 (02/01 0314) Resp:  [0-25] 18 (02/01 0800) BP: (39-108)/(17-72) 107/68 mmHg (02/01 0800) SpO2:  [59 %-71 %] 68 % (02/01 0300) FiO2 (%):  [40 %-100 %] 40 % (02/01 0812) Weight:  [103.8 kg (228 lb 13.4 oz)] 103.8 kg (228 lb 13.4 oz) (02/01 0351) Last BM Date:  (PTA)  Weight change: Filed Weights   03/18/2014 0700 04/02/14 0351  Weight: 99.1 kg (218 lb 7.6 oz) 103.8 kg (228 lb 13.4 oz)    Intake/Output:   Intake/Output Summary (Last 24 hours) at 04/02/14 0831 Last data filed at 04/02/14 0800  Gross per 24 hour  Intake 5160.01 ml  Output    980 ml  Net 4180.01 ml     Physical Exam: General:  Intubated. 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  Cor: PMI laterally displaced. Tachy regular +s3 Lungs: clear Abdomen: soft, nontender, nondistended. No hepatosplenomegaly. No bruits or masses. Good bowel sounds. Extremities: no cyanosis, clubbing, rash, edema. R groin Swan and impella ok Neuro: awake on vent  Telemetry: Sinus tach 120-130s  Labs: Basic Metabolic Panel:  Recent Labs Lab 03/19/2014 0142 03/04/2014 0307 03/16/2014 1200 03/29/2014 2300 04/02/14 0333  NA 131* 135 136 134* 134*  K 3.5 4.8 4.2 4.2 4.3  CL 104 104 112 110 112  CO2 17*  --  19 20 19   GLUCOSE 226* 213* 174* 221* 233*  BUN 6 8 8 11 11   CREATININE 1.37* 1.30 1.11 1.23 1.18  CALCIUM 7.9*  --  7.1* 7.3* 7.2*  MG  --   --   --   --  1.6  PHOS  --   --   --   --  1.8*    Liver Function Tests:  Recent  Labs Lab 04/02/14 0333  AST 435*  ALT 133*  ALKPHOS 102  BILITOT 2.9*  PROT 5.0*  ALBUMIN 2.0*   No results for input(s): LIPASE, AMYLASE in the last 168 hours. No results for input(s): AMMONIA in the last 168 hours.  CBC:  Recent Labs Lab 03/22/2014 0142 03/06/2014 0307 03/10/2014 1207 03/24/2014 2300 04/02/14 0333  WBC 15.4*  --  13.0* 17.5* 19.8*  NEUTROABS 12.2*  --   --   --  15.1*  HGB 15.0 15.0 13.2 11.7* 11.6*  HCT 43.0 44.0 38.6* 34.7* 33.7*  MCV 87.4  --  87.5 88.1 88.5  PLT 279  --  243 237 242    Cardiac Enzymes:  Recent Labs Lab 03/27/2014 1207 03/22/2014 1843 04/02/14 0333  TROPONINI >80.00* >81.00* >81.00*    BNP: BNP (last 3 results) No results for input(s): PROBNP in the last 8760 hours.   Other results:    Imaging: Dg Chest Port 1 View  04/02/2014   CLINICAL DATA:  Acute respiratory failure.  EXAM: PORTABLE CHEST - 1 VIEW  COMPARISON:  03/30/2014.  FINDINGS: Endotracheal tube, NG tube and right femoral Swan-Ganz catheter in stable position. Small curl again noted in  the Swan-Ganz catheter. Swan-Ganz catheter tip is in the right main pulmonary artery in unchanged position. Interim clearing of bilateral pulmonary edema/infiltrates. No pleural effusion or pneumothorax. Stable cardiomegaly. No acute osseous abnormality.  IMPRESSION: 1. Lines and tubes in stable position. Right femoral Swan-Ganz catheter stable position. Again noted is a small curl in the Swan-Ganz catheter. 2. Interim clearing of pulmonary edema/infiltrates.   Electronically Signed   By: Maisie Fus  Register   On: 04/02/2014 07:38   Portable Chest Xray  03/03/2014   CLINICAL DATA:  Encounter for intubation. S/p intubation EVALUATE ETT POSITION AND OG POSITION  EXAM: PORTABLE CHEST - 1 VIEW  COMPARISON:  None  FINDINGS: Endotracheal tube tip projects 2.5 cm above the carina.  There is a femoral a placed Swan-Ganz catheter that has a tight curl that projects near the junction of the right atrium and  the inferior vena cava. Tip projects in the peripheral right pulmonary artery.  Nasal/orogastric tube tip passes well within the stomach.  Hazy airspace opacity is seen in a central distribution most consistent with pulmonary edema.  Cardiac silhouette is mildly enlarged. No mediastinal or hilar masses.  IMPRESSION: 1. Endotracheal tube is well positioned as is the nasal/oral gastric tube. 2. Small curl within the femoral a placed Swan-Ganz catheter. See above description. 3. Pulmonary edema.  This is likely due to congestive heart failure.   Electronically Signed   By: Amie Portland M.D.   On: 03/02/2014 09:15      Medications:     Scheduled Medications: . antiseptic oral rinse  7 mL Mouth Rinse QID  . aspirin  324 mg Oral Once  . aspirin  81 mg Per Tube Daily  . atorvastatin  80 mg Per Tube q1800  . chlorhexidine  15 mL Mouth Rinse BID  . digoxin  0.25 mg Intravenous Daily  . impella catheter heparin 50 unit/mL  25,000 Units Intracatheter Q24H  . magnesium sulfate 1 - 4 g bolus IVPB  4 g Intravenous Once  . pantoprazole (PROTONIX) IV  40 mg Intravenous Daily  . sodium chloride  3 mL Intravenous Q12H  . ticagrelor  90 mg Per Tube BID  . vancomycin  1,250 mg Intravenous Q12H     Infusions: . sodium chloride 1,000 mL (03/31/2014 0700)  . sodium chloride 125 mL/hr at 04/02/14 0700  . fentaNYL infusion INTRAVENOUS 25 mcg/hr (04/02/14 0700)  . heparin 1,000 Units/hr (04/02/14 0700)  . midazolam (VERSED) infusion 2 mg/hr (04/02/14 0700)  . milrinone 0.5 mcg/kg/min (04/02/14 0751)  . norepinephrine (LEVOPHED) Adult infusion 15.04 mcg/min (04/02/14 0700)     PRN Medications:  Place/Maintain arterial line **AND** sodium chloride, sodium chloride, sodium chloride, acetaminophen, fentaNYL, Influenza vac split quadrivalent PF, midazolam, ondansetron (ZOFRAN) IV, pneumococcal 23 valent vaccine, sodium chloride   Assessment:   1. Cardiogenic shock    --s/p Impella placement 1/31 2. Acute  on chronic systolic HF    --EF 10% on echo 3. Anterolateral STEMI 1/31   --due to stent occlusion   --s/p PCI with DES to LAD and OM-2 4. VDRF 5. CAD 6. Tobacco abuse ongoing 7. 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 Hypomagnesemia/hypnatremia 9. Shock liver   Plan/Discussion:     He is improving slowly with Impella and dual inotrope support however cardiac output still marginal. On bedside echo this am which I did personally - Impella well positioned and LVEF responding well to support. Renal function currently stable. Oxygenation is good. Will increase  milrinone.   Have discussed with Dr. Donata Clay about possible step up to Impella 5.0 but I am somewhat concerned that RV may not tolerate.   Continue current support. Continue heparin and Brillinta.   Long talk with daughter Morrie Sheldon about situation and also potential need for durable VAD. Not transplant candidate due to current tobacco use.   The patient is critically ill with multiple organ systems failure and requires high complexity decision making for assessment and support, frequent evaluation and titration of therapies, application of advanced monitoring technologies and extensive interpretation of multiple databases.   Critical Care Time devoted to patient care services described in this note is 75 Minutes.    Length of Stay: 1   Arvilla Meres MD 04/02/2014, 8:31 AM  Advanced Heart Failure Team Pager [phone] (M-F; 7a - 4p)  Please contact [facility] for night-coverage after hours (4p -7a ) and weekends on [email or web address]

## 2014-04-02 NOTE — Progress Notes (Signed)
ANTIBIOTIC CONSULT NOTE - INITIAL  Pharmacy Consult for vancomycin Indication: rule out sepsis  No Known Allergies  Patient Measurements: Height: 5\' 11"  (180.3 cm) Weight: 218 lb 7.6 oz (99.1 kg) IBW/kg (Calculated) : 75.3  Vital Signs: Temp: 99.9 F (37.7 C) (01/31 2300) Temp Source: Core (Comment) (01/31 2000) BP: 63/25 mmHg (01/31 2300) Pulse Rate: 139 (01/31 2059)  Labs:  Recent Labs  03/19/2014 0142 03/28/2014 0307 03/31/2014 1200 03/02/2014 1207 03/08/2014 2300  WBC 15.4*  --   --  13.0* 17.5*  HGB 15.0 15.0  --  13.2 11.7*  PLT 279  --   --  243 237  CREATININE 1.37* 1.30 1.11  --  1.23   Estimated Creatinine Clearance: 78.5 mL/min (by C-G formula based on Cr of 1.23).   Microbiology: Recent Results (from the past 720 hour(s))  MRSA PCR Screening     Status: None   Collection Time: 03/05/2014  8:01 AM  Result Value Ref Range Status   MRSA by PCR NEGATIVE NEGATIVE Final    Comment:        The GeneXpert MRSA Assay (FDA approved for NASAL specimens only), is one component of a comprehensive MRSA colonization surveillance program. It is not intended to diagnose MRSA infection nor to guide or monitor treatment for MRSA infections.     Medical History: Past Medical History  Diagnosis Date  . MI (myocardial infarction)     Medications:  Prescriptions prior to admission  Medication Sig Dispense Refill Last Dose  . amLODipine (NORVASC) 5 MG tablet Take 5 mg by mouth daily.   over 30 days  . aspirin 325 MG tablet Take 325 mg by mouth daily.   Past Month at Unknown time  . Multiple Vitamins-Minerals (MULTIVITAMIN PO) Take 1 tablet by mouth daily.   over 30 days  . pravastatin (PRAVACHOL) 40 MG tablet Take 40 mg by mouth daily.   over 30 days  . propranolol (INDERAL) 40 MG tablet Take 40 mg by mouth 2 (two) times daily.   over 30 days at unknown   Scheduled:  . antiseptic oral rinse  7 mL Mouth Rinse QID  . aspirin  324 mg Oral Once  . aspirin  81 mg Per Tube  Daily  . atorvastatin  80 mg Per Tube q1800  . chlorhexidine  15 mL Mouth Rinse BID  . digoxin  0.25 mg Intravenous Daily  . impella catheter heparin 50 unit/mL  25,000 Units Intracatheter Q24H  . pantoprazole (PROTONIX) IV  40 mg Intravenous Daily  . sodium chloride  3 mL Intravenous Q12H  . ticagrelor  90 mg Per Tube BID   Infusions:  . sodium chloride 1,000 mL (03/14/2014 0700)  . fentaNYL infusion INTRAVENOUS 25 mcg/hr (03/18/2014 2000)  . heparin 800 Units/hr (03/04/2014 2000)  . midazolam (VERSED) infusion 2 mg/hr (03/09/2014 1935)  . milrinone 0.375 mcg/kg/min (03/31/2014 1247)  . norepinephrine (LEVOPHED) Adult infusion 15 mcg/min (04/02/14 0001)    Assessment: 59yo male admitted s/p STEMI, now w/ Impella, CBC now up to 17.5 from 13, to begin IV ABX empirically.  Goal of Therapy:  Vancomycin trough level 15-20 mcg/ml  Plan:  Will begin vancomycin 1250mg  IV Q12H and monitor CBC, Cx, levels prn.  Vernard GamblesVeronda Hartford Maulden, PharmD, BCPS  04/02/2014,12:01 AM

## 2014-04-02 DEATH — deceased

## 2014-04-03 ENCOUNTER — Inpatient Hospital Stay (HOSPITAL_COMMUNITY): Payer: Medicare Other

## 2014-04-03 LAB — CARBOXYHEMOGLOBIN
CARBOXYHEMOGLOBIN: 0.9 % (ref 0.5–1.5)
METHEMOGLOBIN: 1 % (ref 0.0–1.5)
O2 Saturation: 71.7 %
TOTAL HEMOGLOBIN: 12.8 g/dL — AB (ref 13.5–18.0)

## 2014-04-03 LAB — BLOOD GAS, ARTERIAL
Acid-base deficit: 5.6 mmol/L — ABNORMAL HIGH (ref 0.0–2.0)
Bicarbonate: 18 mEq/L — ABNORMAL LOW (ref 20.0–24.0)
FIO2: 0.4 %
MECHVT: 600 mL
O2 Saturation: 99 %
PEEP/CPAP: 8 cmH2O
Patient temperature: 98.6
RATE: 22 resp/min
TCO2: 18.8 mmol/L (ref 0–100)
pCO2 arterial: 27.7 mmHg — ABNORMAL LOW (ref 35.0–45.0)
pH, Arterial: 7.427 (ref 7.350–7.450)
pO2, Arterial: 142 mmHg — ABNORMAL HIGH (ref 80.0–100.0)

## 2014-04-03 LAB — GLUCOSE, CAPILLARY
GLUCOSE-CAPILLARY: 237 mg/dL — AB (ref 70–99)
GLUCOSE-CAPILLARY: 257 mg/dL — AB (ref 70–99)
Glucose-Capillary: 211 mg/dL — ABNORMAL HIGH (ref 70–99)
Glucose-Capillary: 154 mg/dL — ABNORMAL HIGH (ref 70–99)

## 2014-04-03 LAB — POCT ACTIVATED CLOTTING TIME
ACTIVATED CLOTTING TIME: 153 s
ACTIVATED CLOTTING TIME: 165 s
ACTIVATED CLOTTING TIME: 165 s
ACTIVATED CLOTTING TIME: 165 s
ACTIVATED CLOTTING TIME: 165 s
Activated Clotting Time: 159 seconds
Activated Clotting Time: 159 seconds
Activated Clotting Time: 171 seconds
Activated Clotting Time: 159 seconds
Activated Clotting Time: 146 seconds
Activated Clotting Time: 165 seconds
Activated Clotting Time: 159 seconds

## 2014-04-03 LAB — CBC WITH DIFFERENTIAL/PLATELET
BASOS PCT: 0 % (ref 0–1)
Basophils Absolute: 0 10*3/uL (ref 0.0–0.1)
Eosinophils Absolute: 0 10*3/uL (ref 0.0–0.7)
Eosinophils Relative: 0 % (ref 0–5)
HCT: 30.3 % — ABNORMAL LOW (ref 39.0–52.0)
Hemoglobin: 10.4 g/dL — ABNORMAL LOW (ref 13.0–17.0)
Lymphocytes Relative: 18 % (ref 12–46)
Lymphs Abs: 3 10*3/uL (ref 0.7–4.0)
MCH: 29.2 pg (ref 26.0–34.0)
MCHC: 34.3 g/dL (ref 30.0–36.0)
MCV: 85.1 fL (ref 78.0–100.0)
MONOS PCT: 8 % (ref 3–12)
Monocytes Absolute: 1.4 10*3/uL — ABNORMAL HIGH (ref 0.1–1.0)
NEUTROS ABS: 12.3 10*3/uL — AB (ref 1.7–7.7)
Neutrophils Relative %: 74 % (ref 43–77)
Platelets: 164 10*3/uL (ref 150–400)
RBC: 3.56 MIL/uL — ABNORMAL LOW (ref 4.22–5.81)
RDW: 13.8 % (ref 11.5–15.5)
WBC: 16.7 10*3/uL — ABNORMAL HIGH (ref 4.0–10.5)

## 2014-04-03 LAB — BASIC METABOLIC PANEL
Anion gap: 4 — ABNORMAL LOW (ref 5–15)
BUN: 13 mg/dL (ref 6–23)
CALCIUM: 7.4 mg/dL — AB (ref 8.4–10.5)
CO2: 20 mmol/L (ref 19–32)
CREATININE: 0.99 mg/dL (ref 0.50–1.35)
Chloride: 110 mmol/L (ref 96–112)
GFR calc non Af Amer: 88 mL/min — ABNORMAL LOW (ref >90)
GLUCOSE: 225 mg/dL — AB (ref 70–99)
Potassium: 3.7 mmol/L (ref 3.5–5.1)
Sodium: 134 mmol/L — ABNORMAL LOW (ref 135–145)

## 2014-04-03 LAB — HEMOGLOBIN A1C
Hgb A1c MFr Bld: 6 % — ABNORMAL HIGH (ref 4.8–5.6)
Mean Plasma Glucose: 126 mg/dL

## 2014-04-03 LAB — APTT: APTT: 93 s — AB (ref 24–37)

## 2014-04-03 LAB — HIV ANTIBODY (ROUTINE TESTING W REFLEX): HIV Screen 4th Generation wRfx: NONREACTIVE

## 2014-04-03 LAB — LACTATE DEHYDROGENASE: LDH: 1863 U/L — AB (ref 94–250)

## 2014-04-03 LAB — PROCALCITONIN: Procalcitonin: 1.15 ng/mL

## 2014-04-03 LAB — TROPONIN I: Troponin I: >80 ng/mL (ref <0.031)

## 2014-04-03 MED ORDER — POTASSIUM CHLORIDE 20 MEQ/15ML (10%) PO SOLN
40.0000 meq | Freq: Once | ORAL | Status: AC
  Administered 2014-04-03: 40 meq
  Filled 2014-04-03: qty 30

## 2014-04-03 MED ORDER — FUROSEMIDE 10 MG/ML IJ SOLN: 20.0000 mg | Freq: Once | INTRAMUSCULAR | Status: DC

## 2014-04-03 MED ORDER — FUROSEMIDE 10 MG/ML IJ SOLN
INTRAMUSCULAR | Status: AC
  Administered 2014-04-03: 20 mg
  Filled 2014-04-03: qty 2

## 2014-04-03 MED ORDER — INSULIN GLARGINE 100 UNIT/ML ~~LOC~~ SOLN
25.0000 [IU] | Freq: Every day | SUBCUTANEOUS | Status: DC
  Administered 2014-04-04: 25 [IU] via SUBCUTANEOUS
  Filled 2014-04-03: qty 0.25

## 2014-04-03 MED ORDER — HEPARIN (PORCINE) IN NACL 100-0.45 UNIT/ML-% IJ SOLN
1000.0000 [IU]/h | INTRAMUSCULAR | Status: DC
Start: 2014-04-03 — End: 2014-04-05
  Administered 2014-04-03 – 2014-04-04 (×2): 1700 [IU]/h via INTRAVENOUS
  Filled 2014-04-03 (×4): qty 250

## 2014-04-03 NOTE — Progress Notes (Signed)
PULMONARY / CRITICAL CARE MEDICINE   Name: Micheal Ayala MRN: [id number] DOB: 09/16/1955    ADMISSION DATE:  03/07/2014 CONSULTATION DATE:  03/21/2014  REFERRING MD :  Dr. Herbie BaltimoreHarding  CHIEF COMPLAINT:  STEMI  INITIAL PRESENTATION: 2M smoker male visiting [place] from [place] with HTN, CAD s/p STEMI 2002, presenting to ED via EMS as code STEMI with anterior and inferior STEMI. Initially hemodynically stable in the field, then severe respiratory distress on arrival to ED and hypotensive. Started on pressors. Taken emergently to cath lab for STEMI and cardiogenic shock. PCCM consulted for respiratory distress and vent management.   STUDIES:  1/31 Cath and PCI: Severe multivessel disease with occluded LAD and Codominant Circumflex with severe disease in the RCA and OM1.  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  Cardiac function currently being supported by 3.5 mL Impella.  Successful 2 vessel PCI of the early mid LAD and mid circumflex into OM 2 with Synergy DES stents. 1/31 Echo > LVEF 20-25%, severely reduced systolic function  SIGNIFICANT EVENTS: 1/31: Chest pain x2 hours, called EMS, code STEMI, hypotensive and resp distress on arrival to ED, taken emergently to cath lab, 2 vessel PCI of LAD and Circumflex, Impella insertion, on pressors, remains intubated.    SUBJECTIVE: remains on high dose milrinone / impella  VITAL SIGNS: Temp:  [98.4 F (36.9 C)-99 F (37.2 C)] 99 F (37.2 C) (02/02 1115) Pulse Rate:  [120-128] 123 (02/02 1115) Resp:  [22-24] 22 (02/02 1115) BP: (75-104)/(56-73) 91/63 mmHg (02/02 1100) SpO2:  [87 %-99 %] 98 % (02/02 1115) FiO2 (%):  [40 %] 40 % (02/02 0800) Weight:  [106.3 kg (234 lb 5.6 oz)] 106.3 kg (234 lb 5.6 oz) (02/02 0500) HEMODYNAMICS: PAP: (21-30)/(14-21) 25/15 mmHg CVP:  [12 mmHg-17 mmHg] 15 mmHg PCWP:  [13 mmHg-19 mmHg] 18 mmHg CO:  [3.5 L/min-4.6 L/min] 4.2 L/min CI:  [1.6  L/min/m2-2.1 L/min/m2] 1.9 L/min/m2 VENTILATOR SETTINGS: Vent Mode:  [-] PRVC FiO2 (%):  [40 %] 40 % Set Rate:  [22 bmp] 22 bmp Vt Set:  [600 mL] 600 mL PEEP:  [5 cmH20-8 cmH20] 5 cmH20 Plateau Pressure:  [20 cmH20-22 cmH20] 21 cmH20 INTAKE / OUTPUT:  Intake/Output Summary (Last 24 hours) at 04/03/14 1157 Last data filed at 04/03/14 1100  Gross per 24 hour  Intake 4221.59 ml  Output    912 ml  Net 3309.59 ml   PHYSICAL EXAMINATION: General:  Sedated on vent HEENT: jvd noted PULM; coarse CV: s1 s2 rrr AB: BS+, soft, nontender Ext: warm, increased edema Neuro: sedated, rass -2  LABS:  CBC  Recent Labs Lab 04/02/14 0333 04/02/14 1840 04/03/14 0347  WBC 19.8* 18.1* 16.7*  HGB 11.6* 10.0* 10.4*  HCT 33.7* 29.1* 30.3*  PLT 242 201 164   Coag's  Recent Labs Lab 03/30/2014 0142 04/02/14 0333 04/03/14 0347  APTT 138* 77* 93*  INR 1.18  --   --    BMET  Recent Labs Lab 03/17/2014 2300 04/02/14 0333 04/03/14 0347  NA 134* 134* 134*  K 4.2 4.3 3.7  CL 110 112 110  CO2 20 19 20   BUN 11 11 13   CREATININE 1.23 1.18 0.99  GLUCOSE 221* 233* 225*   Electrolytes  Recent Labs Lab 03/28/2014 2300 04/02/14 0333 04/03/14 0347  CALCIUM 7.3* 7.2* 7.4*  MG  --  1.6  --   PHOS  --  1.8*  --    ABG  Recent  Labs Lab 03/29/2014 1133 04/02/14 0305 04/03/14 0416  PHART 7.391 7.394 7.427  PCO2ART 25.1* 27.3* 27.7*  PO2ART 195.0* 162.0* 142.0*   Glucose  Recent Labs Lab 04/02/14 1114 04/02/14 1617 04/02/14 2012 04/03/14 04/03/14 0358 04/03/14 0759  GLUCAP 216* 212* 206* 221* 211* 154*   ASSESSMENT / PLAN:  PULMONARY OETT 1/31>>> A: Acute respiratory failure due to cardiogenic shock> CXR 2/1 remarkably normal and improving P:   Continue full vent support SBT/WUA NOT able with cardiac status abg reviewed, consider slight MV reduction abg in am  follow shock state Lasix started, pcxr resolving with cardiac support To goal 40% peep  5  CARDIOVASCULAR R arterial sheath and swan 1/31>>> L Impella 1/31>>> A:  Cardiogenic shock post STEMI Cath 1/31: severe multivessel disease with occluded LAD and Circumflex with severe disease in RCA and OM1.  S/p 2 vessel PCI of early mid LAD and mid circumflex. S/p Impella insertion.  Severe ischemic cardiomyopathy P:  Impella/milrinone/iontropes/Coox/CVP/anti-platelets per cardiology Tele Ideally would like net even to slightly negative, lasix started  RENAL A:  Hyponatremia AKI--improving Mild hyperchloremia P:   kvo agree Lasix, follow BP  GASTROINTESTINAL A:  Congestive hepatopathy P:   tube feedings on going Protonix lft in am  lasix  HEMATOLOGIC A:  No acute isues P:  Monitor Hb, s/p cath On Heparin gtt, Aggrastat, and Brilinta per cardiology  INFECTIOUS A:  Fever/Leukocytosis post MI 1/31, started on Vanc empirically per pharmacy notes, no blood culture sent P:    2/1 blood cx >> 1/31 vanc >>  Pct to dc vanc likely can dc vanc  ENDOCRINE A:  Hyperglycemia worse Likely diabetic  P:   Ssi Increase lantus  NEUROLOGIC A:  Sedated on mechanical ventilation, following commands P:   RASS goal: -1  Fent and versed gtt reluctant to wua   FAMILY  - Updates: No family by bedside on my exam 2/1  - Inter-disciplinary family meet or Palliative Care meeting due by:  day 7  Ccm time 30 min   [name]. [name], MD, FACP Pgr: [phone] [facility]

## 2014-04-03 NOTE — Progress Notes (Signed)
Advanced Heart Failure Rounding Note   Subjective:    Awake on vent.   Remains on Impella. Flow 2.8. Now on levophed 20 and milrinone 0.5 Urine ouput about 30cc/hr. Renal function stable.  Got 1 unit RBCs last night.   Micheal Ayala numbers done personally at bedside.  RA 17 PA  30/20 (24) PCWP 18 Thermo CO/CI  4.2/1.95 SVR 1083  Bedside echo shows Impella in good position. 3.3cm   Objective:   Weight Range:  Vital Signs:   Temp:  [98.4 F (36.9 C)-99 F (37.2 C)] 98.6 F (37 C) (02/02 0900) Pulse Rate:  [120-128] 120 (02/02 0900) Resp:  [22-24] 22 (02/02 0900) BP: (74-99)/(56-73) 90/68 mmHg (02/02 0900) SpO2:  [87 %-99 %] 98 % (02/02 0900) FiO2 (%):  [40 %-50 %] 40 % (02/02 0800) Weight:  [106.3 kg (234 lb 5.6 oz)] 106.3 kg (234 lb 5.6 oz) (02/02 0500) Last BM Date:  (unknown)  Weight change: Filed Weights   03/29/2014 0700 04/02/14 0351 04/03/14 0500  Weight: 99.1 kg (218 lb 7.6 oz) 103.8 kg (228 lb 13.4 oz) 106.3 kg (234 lb 5.6 oz)    Intake/Output:   Intake/Output Summary (Last 24 hours) at 04/03/14 0934 Last data filed at 04/03/14 0900  Gross per 24 hour  Intake 4434.77 ml  Output    534 ml  Net 3900.77 ml     Physical Exam: General:  Intubated. 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  Cor: PMI laterally displaced. Tachy regular +s3 Lungs: clear Abdomen: soft, nontender, nondistended. No hepatosplenomegaly. No bruits or masses. Hypoactive bowel sounds. Extremities: no cyanosis, clubbing, rash, tr edema. R groin Swan and impella ok Neuro: awake on vent  Telemetry: Sinus tach 120-130s  Labs: Basic Metabolic Panel:  Recent Labs Lab 03/06/2014 0142 03/31/2014 0307 03/20/2014 1200 03/19/2014 2300 04/02/14 0333 04/03/14 0347  NA 131* 135 136 134* 134* 134*  K 3.5 4.8 4.2 4.2 4.3 3.7  CL 104 104 112 110 112 110  CO2 17*  --  GLUCOSE 226* 213* 174* 221* 233* 225*  BUN CREATININE 1.37* 1.30 1.11 1.23 1.18 0.99  CALCIUM 7.9*  --  7.1* 7.3* 7.2* 7.4*  MG  --   --   --   --  1.6  --   PHOS  --   --   --   --  1.8*  --     Liver Function Tests:  Recent Labs Lab 04/02/14 0333  AST 435*  ALT 133*  ALKPHOS 102  BILITOT 2.9*  PROT 5.0*  ALBUMIN 2.0*   No results for input(s): LIPASE, AMYLASE in the last 168 hours. No results for input(s): AMMONIA in the last 168 hours.  CBC:  Recent Labs Lab 03/02/2014 0142  03/27/2014 1207 03/17/2014 2300 04/02/14 0333 04/02/14 1840 04/03/14 0347  WBC 15.4*  --  13.0* 17.5* 19.8* 18.1* 16.7*  NEUTROABS 12.2*  --   --   --  15.1*  --  12.3*  HGB 15.0  < > 13.2 11.7* 11.6* 10.0* 10.4*  HCT 43.0  < > 38.6* 34.7* 33.7* 29.1* 30.3*  MCV 87.4  --  87.5 88.1 88.5 86.4 85.1  PLT 279  --  243 237 242 201 164  < > = values in this interval not displayed.  Cardiac Enzymes:  Recent Labs Lab 03/28/2014 1207 03/22/2014 1843 04/02/14 0333  TROPONINI >80.00* >81.00* >  81.00*    BNP: BNP (last 3 results) No results for input(s): PROBNP in the last 8760 hours.   Other results:    Imaging: Dg Chest Port 1 View  04/03/2014   CLINICAL DATA:  Respiratory failure.  EXAM: PORTABLE CHEST - 1 VIEW  COMPARISON:  04/02/2014.  FINDINGS: Endotracheal tube and NG tube in stable position. Swan-Ganz catheter in stable position with tip in right main pulmonary artery. Slight curl is again noted in the Swan-Ganz catheter. Stable cardiomegaly. Low lung volumes with bibasilar mild atelectasis and/or infiltrates. No pleural effusion or pneumothorax.  IMPRESSION: 1. Lines and tubes in stable position. Right femoral Swan-Ganz catheter unchanged with its tip in the right main pulmonary artery. 2. Stable cardiomegaly.  No pulmonary venous congestion. 3. Low lung volumes with mild bibasilar atelectasis and/or infiltrates.   Electronically Signed   By: Maisie Fushomas  Register   On: 04/03/2014 07:27   Dg Chest Port 1 View  04/02/2014   CLINICAL DATA:   Acute respiratory failure.  EXAM: PORTABLE CHEST - 1 VIEW  COMPARISON:  03/21/2014.  FINDINGS: Endotracheal tube, NG tube and right femoral Swan-Ganz catheter in stable position. Small curl again noted in the Swan-Ganz catheter. Swan-Ganz catheter tip is in the right main pulmonary artery in unchanged position. Interim clearing of bilateral pulmonary edema/infiltrates. No pleural effusion or pneumothorax. Stable cardiomegaly. No acute osseous abnormality.  IMPRESSION: 1. Lines and tubes in stable position. Right femoral Swan-Ganz catheter stable position. Again noted is a small curl in the Swan-Ganz catheter. 2. Interim clearing of pulmonary edema/infiltrates.   Electronically Signed   By: Maisie Fushomas  Register   On: 04/02/2014 07:38     Medications:     Scheduled Medications: . antiseptic oral rinse  7 mL Mouth Rinse QID  . aspirin  324 mg Oral Once  . aspirin  81 mg Per Tube Daily  . atorvastatin  80 mg Per Tube q1800  . chlorhexidine  15 mL Mouth Rinse BID  . digoxin  0.25 mg Intravenous Daily  . feeding supplement (PRO-STAT SUGAR FREE 64)  60 mL Per Tube QID  . feeding supplement (VITAL HIGH PROTEIN)  1,000 mL Per Tube Q24H  . furosemide  20 mg Intravenous Once  . impella catheter heparin 50 unit/mL  25,000 Units Intracatheter Q24H  . insulin aspart  0-15 Units Subcutaneous 6 times per day  . insulin glargine  15 Units Subcutaneous Daily  . multivitamin with minerals  1 tablet Per Tube Daily  . pantoprazole sodium  40 mg Per Tube Daily  . sodium chloride  3 mL Intravenous Q12H  . ticagrelor  90 mg Per Tube BID  . vancomycin  1,250 mg Intravenous Q12H    Infusions: . sodium chloride 1,000 mL (03/16/2014 0700)  . sodium chloride 50 mL/hr at 04/03/14 0800  . fentaNYL infusion INTRAVENOUS 50 mcg/hr (04/03/14 0800)  . heparin 1,400 Units/hr (04/03/14 0200)  . midazolam (VERSED) infusion 2 mg/hr (04/03/14 0800)  . milrinone 0.5 mcg/kg/min (04/03/14 0800)  . norepinephrine (LEVOPHED) Adult  infusion 20 mcg/min (04/03/14 0800)    PRN Medications: Place/Maintain arterial line **AND** sodium chloride, sodium chloride, sodium chloride, acetaminophen, fentaNYL, Influenza vac split quadrivalent PF, midazolam, ondansetron (ZOFRAN) IV, pneumococcal 23 valent vaccine, sodium chloride   Assessment:   1. Cardiogenic shock    --s/p Impella placement 1/31 2. Acute on chronic systolic HF    --EF 10% on echo 3. Anterolateral STEMI 1/31   --due to stent occlusion   --s/p PCI with DES  to LAD and OM-2 4. VDRF 5. CAD 6. Tobacco abuse ongoing 7. 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 Hypomagnesemia/hypnatremia 9. Shock liver 10. Anemia   Plan/Discussion:     He is improving slowly with Impella and dual inotrope support. On bedside echo this am which I did personally - Impella well positioned and LVEF responding well to support. Renal function currently stable. Oxygenation is good. Continue milrinone and levophed. Will attempt slow diuresis today. Otherwise continue current support. May try to wean Impella slowly over next 24-36 hours.    Continue heparin and Brillinta.   Long talk with daughter Morrie Sheldon about situation. Not transplant candidate due to current tobacco use. Likely not good VAD candidate due to social situation.   The patient is critically ill with multiple organ systems failure and requires high complexity decision making for assessment and support, frequent evaluation and titration of therapies, application of advanced monitoring technologies and extensive interpretation of multiple databases.   Critical Care Time devoted to patient care services described in this note is 45 Minutes.  Length of Stay: 2   Arvilla Meres MD 04/03/2014, 9:34 AM  Advanced Heart Failure Team Pager [phone] (M-F; 7a - 4p)  Please contact [facility] for night-coverage after hours (4p -7a ) and weekends on [email or web address]

## 2014-04-03 NOTE — Plan of Care (Signed)
Problem: Phase I Progression Outcomes Goal: HOB elevated 30 degrees Outcome: Not Met (add Reason) Due to Impella placement at L. Fem. and SGC at R fem.

## 2014-04-03 NOTE — Progress Notes (Signed)
eLink Physician-Brief Progress Note Patient Name: Micheal Ayala DOB: 05/15/1955 MRN: [id number]   Date of Service  04/03/2014  HPI/Events of Note  K level of 3.7  eICU Interventions  40 mEq KCL      Intervention Category Minor Interventions: Electrolytes abnormality - evaluation and management  [name] 04/03/2014, 6:31 AM

## 2014-04-03 NOTE — Progress Notes (Signed)
Report received from [facility] at [phone]. Both this RN and Carolynn Serve assessed patient Impella placement, infusions,NGT placement and rt sheath /rt swan . The dressing covering the rt swan/femoral venous introducer and rt femoral arterial aline was removed due to drainage. This RN performed handwashing prior to donning gloves to remove dressing. The mask was placed over both this RN and IT trainer. Central line dressing kit opened.Hands were cleaned with hand sanitizer prior to applying sterile gloves.  Sterile gloves donned by this RN. Site cleaned with chlorhexidine wipe x 2 . Allowed to dry. Gauze applied around both femoral arterial sheath and venous sheath (introducer). Tegaderm dressing applied x 2 over both sheaths. New swan marking was at 80 cm once dressing removed. No bleeding or swelling present , level zero. Added arterial line to line log in doc flowsheet since absent. Patient tolerated procedure without issues.

## 2014-04-04 ENCOUNTER — Inpatient Hospital Stay (HOSPITAL_COMMUNITY): Payer: Medicare Other

## 2014-04-04 LAB — BLOOD GAS, ARTERIAL
Acid-base deficit: 1.7 mmol/L (ref 0.0–2.0)
Bicarbonate: 21.9 mEq/L (ref 20.0–24.0)
FIO2: 0.4 %
LHR: 18 {breaths}/min
O2 SAT: 96.4 %
PATIENT TEMPERATURE: 98.6
PEEP: 5 cmH2O
PH ART: 7.434 (ref 7.350–7.450)
TCO2: 22.9 mmol/L (ref 0–100)
VT: 600 mL
pCO2 arterial: 33.3 mmHg — ABNORMAL LOW (ref 35.0–45.0)
pO2, Arterial: 83.9 mmHg (ref 80.0–100.0)

## 2014-04-04 LAB — RENAL FUNCTION PANEL
ANION GAP: 7 (ref 5–15)
Albumin: 2 g/dL — ABNORMAL LOW (ref 3.5–5.2)
BUN: 30 mg/dL — ABNORMAL HIGH (ref 6–23)
CO2: 22 mmol/L (ref 19–32)
Calcium: 7.5 mg/dL — ABNORMAL LOW (ref 8.4–10.5)
Chloride: 105 mmol/L (ref 96–112)
Creatinine, Ser: 0.9 mg/dL (ref 0.50–1.35)
GFR calc non Af Amer: >90 mL/min (ref >90)
Glucose, Bld: 203 mg/dL — ABNORMAL HIGH (ref 70–99)
POTASSIUM: 3.3 mmol/L — AB (ref 3.5–5.1)
Phosphorus: 1.7 mg/dL — ABNORMAL LOW (ref 2.3–4.6)
SODIUM: 134 mmol/L — AB (ref 135–145)

## 2014-04-04 LAB — CARBOXYHEMOGLOBIN
CARBOXYHEMOGLOBIN: 1 % (ref 0.5–1.5)
Carboxyhemoglobin: 0.9 % (ref 0.5–1.5)
METHEMOGLOBIN: 0.8 % (ref 0.0–1.5)
Methemoglobin: 0.8 % (ref 0.0–1.5)
O2 Saturation: 49.9 %
O2 Saturation: 60.7 %
TOTAL HEMOGLOBIN: 8.1 g/dL — AB (ref 13.5–18.0)
Total hemoglobin: 17.5 g/dL (ref 13.5–18.0)

## 2014-04-04 LAB — COMPREHENSIVE METABOLIC PANEL
ALT: 100 U/L — AB (ref 0–53)
AST: 117 U/L — ABNORMAL HIGH (ref 0–37)
Albumin: 1.8 g/dL — ABNORMAL LOW (ref 3.5–5.2)
Alkaline Phosphatase: 112 U/L (ref 39–117)
Anion gap: 3 — ABNORMAL LOW (ref 5–15)
BUN: 24 mg/dL — ABNORMAL HIGH (ref 6–23)
CHLORIDE: 111 mmol/L (ref 96–112)
CO2: 21 mmol/L (ref 19–32)
CREATININE: 0.89 mg/dL (ref 0.50–1.35)
Calcium: 7.1 mg/dL — ABNORMAL LOW (ref 8.4–10.5)
GFR calc Af Amer: >90 mL/min (ref >90)
GFR calc non Af Amer: >90 mL/min (ref >90)
GLUCOSE: 223 mg/dL — AB (ref 70–99)
Potassium: 3.6 mmol/L (ref 3.5–5.1)
Sodium: 135 mmol/L (ref 135–145)
TOTAL PROTEIN: 4.7 g/dL — AB (ref 6.0–8.3)
Total Bilirubin: 1.7 mg/dL — ABNORMAL HIGH (ref 0.3–1.2)

## 2014-04-04 LAB — CBC WITH DIFFERENTIAL/PLATELET
BASOS PCT: 0 % (ref 0–1)
Basophils Absolute: 0.1 10*3/uL (ref 0.0–0.1)
EOS PCT: 1 % (ref 0–5)
Eosinophils Absolute: 0.1 10*3/uL (ref 0.0–0.7)
HCT: 26.7 % — ABNORMAL LOW (ref 39.0–52.0)
Hemoglobin: 9 g/dL — ABNORMAL LOW (ref 13.0–17.0)
LYMPHS PCT: 16 % (ref 12–46)
Lymphs Abs: 2.4 10*3/uL (ref 0.7–4.0)
MCH: 29 pg (ref 26.0–34.0)
MCHC: 33.7 g/dL (ref 30.0–36.0)
MCV: 86.1 fL (ref 78.0–100.0)
MONOS PCT: 9 % (ref 3–12)
Monocytes Absolute: 1.3 10*3/uL — ABNORMAL HIGH (ref 0.1–1.0)
Neutro Abs: 10.7 10*3/uL — ABNORMAL HIGH (ref 1.7–7.7)
Neutrophils Relative %: 74 % (ref 43–77)
Platelets: 123 10*3/uL — ABNORMAL LOW (ref 150–400)
RBC: 3.1 MIL/uL — ABNORMAL LOW (ref 4.22–5.81)
RDW: 14.4 % (ref 11.5–15.5)
WBC: 14.5 10*3/uL — ABNORMAL HIGH (ref 4.0–10.5)

## 2014-04-04 LAB — PROCALCITONIN: Procalcitonin: 1.05 ng/mL

## 2014-04-04 LAB — GLUCOSE, CAPILLARY
GLUCOSE-CAPILLARY: 199 mg/dL — AB (ref 70–99)
GLUCOSE-CAPILLARY: 208 mg/dL — AB (ref 70–99)
GLUCOSE-CAPILLARY: 237 mg/dL — AB (ref 70–99)
GLUCOSE-CAPILLARY: 258 mg/dL — AB (ref 70–99)
Glucose-Capillary: 153 mg/dL — ABNORMAL HIGH (ref 70–99)
Glucose-Capillary: 176 mg/dL — ABNORMAL HIGH (ref 70–99)
Glucose-Capillary: 205 mg/dL — ABNORMAL HIGH (ref 70–99)
Glucose-Capillary: 229 mg/dL — ABNORMAL HIGH (ref 70–99)

## 2014-04-04 LAB — CBC
HCT: 32 % — ABNORMAL LOW (ref 39.0–52.0)
HEMATOCRIT: 30.3 % — AB (ref 39.0–52.0)
Hemoglobin: 11.3 g/dL — ABNORMAL LOW (ref 13.0–17.0)
Hemoglobin: 10.3 g/dL — ABNORMAL LOW (ref 13.0–17.0)
MCH: 30.5 pg (ref 26.0–34.0)
MCH: 29.3 pg (ref 26.0–34.0)
MCHC: 35.3 g/dL (ref 30.0–36.0)
MCHC: 34 g/dL (ref 30.0–36.0)
MCV: 86.3 fL (ref 78.0–100.0)
MCV: 86.3 fL (ref 78.0–100.0)
PLATELETS: 93 10*3/uL — AB (ref 150–400)
Platelets: 111 10*3/uL — ABNORMAL LOW (ref 150–400)
RBC: 3.71 MIL/uL — AB (ref 4.22–5.81)
RBC: 3.51 MIL/uL — ABNORMAL LOW (ref 4.22–5.81)
RDW: 14.1 % (ref 11.5–15.5)
RDW: 14.3 % (ref 11.5–15.5)
WBC: 15.9 10*3/uL — ABNORMAL HIGH (ref 4.0–10.5)
WBC: 13 10*3/uL — AB (ref 4.0–10.5)

## 2014-04-04 LAB — APTT: aPTT: 132 seconds — ABNORMAL HIGH (ref 24–37)

## 2014-04-04 LAB — POCT ACTIVATED CLOTTING TIME
ACTIVATED CLOTTING TIME: 177 s
ACTIVATED CLOTTING TIME: 171 s
Activated Clotting Time: 171 seconds
Activated Clotting Time: 171 seconds
Activated Clotting Time: 177 seconds

## 2014-04-04 LAB — RETICULOCYTES
RBC.: 3.14 MIL/uL — AB (ref 4.22–5.81)
RETIC COUNT ABSOLUTE: 119.3 10*3/uL (ref 19.0–186.0)
Retic Ct Pct: 3.8 % — ABNORMAL HIGH (ref 0.4–3.1)

## 2014-04-04 LAB — BILIRUBIN, FRACTIONATED(TOT/DIR/INDIR)
BILIRUBIN DIRECT: 0.9 mg/dL — AB (ref 0.0–0.5)
BILIRUBIN TOTAL: 1.8 mg/dL — AB (ref 0.3–1.2)
Indirect Bilirubin: 0.9 mg/dL (ref 0.3–0.9)

## 2014-04-04 LAB — LACTATE DEHYDROGENASE
LDH: 1408 U/L — ABNORMAL HIGH (ref 94–250)
LDH: 1312 U/L — ABNORMAL HIGH (ref 94–250)

## 2014-04-04 LAB — PREPARE RBC (CROSSMATCH)

## 2014-04-04 MED ORDER — PANTOPRAZOLE SODIUM 40 MG PO PACK
40.0000 mg | PACK | Freq: Two times a day (BID) | ORAL | Status: DC
  Filled 2014-04-04: qty 20

## 2014-04-04 MED ORDER — ADULT MULTIVITAMIN LIQUID CH
5.0000 mL | Freq: Every day | ORAL | Status: DC
  Administered 2014-04-05 – 2014-05-01 (×27): 5 mL
  Filled 2014-04-04 (×27): qty 5

## 2014-04-04 MED ORDER — INSULIN GLARGINE 100 UNIT/ML ~~LOC~~ SOLN
35.0000 [IU] | Freq: Every day | SUBCUTANEOUS | Status: DC
  Administered 2014-04-05 – 2014-04-30 (×25): 35 [IU] via SUBCUTANEOUS
  Filled 2014-04-04 (×28): qty 0.35

## 2014-04-04 MED ORDER — POTASSIUM PHOSPHATES 15 MMOLE/5ML IV SOLN
30.0000 mmol | Freq: Once | INTRAVENOUS | Status: AC
  Administered 2014-04-04: 30 mmol via INTRAVENOUS
  Filled 2014-04-04: qty 10

## 2014-04-04 MED ORDER — FUROSEMIDE 10 MG/ML IJ SOLN
40.0000 mg | Freq: Once | INTRAMUSCULAR | Status: AC
  Administered 2014-04-04: 40 mg via INTRAVENOUS
  Filled 2014-04-04: qty 4

## 2014-04-04 MED ORDER — POTASSIUM CHLORIDE 10 MEQ/50ML IV SOLN
10.0000 meq | INTRAVENOUS | Status: AC
  Administered 2014-04-04 – 2014-04-05 (×4): 10 meq via INTRAVENOUS
  Filled 2014-04-04 (×4): qty 50

## 2014-04-04 MED ORDER — PANTOPRAZOLE SODIUM 40 MG IV SOLR
40.0000 mg | Freq: Two times a day (BID) | INTRAVENOUS | Status: DC
  Administered 2014-04-04 – 2014-04-09 (×10): 40 mg via INTRAVENOUS
  Filled 2014-04-04 (×11): qty 40

## 2014-04-04 MED ORDER — INSULIN GLARGINE 100 UNIT/ML ~~LOC~~ SOLN
10.0000 [IU] | Freq: Once | SUBCUTANEOUS | Status: AC
  Administered 2014-04-04: 10 [IU] via SUBCUTANEOUS
  Filled 2014-04-04: qty 0.1

## 2014-04-04 MED ORDER — SODIUM CHLORIDE 0.9 % IV SOLN
Freq: Once | INTRAVENOUS | Status: AC
  Administered 2014-04-04: 10:00:00 via INTRAVENOUS

## 2014-04-04 NOTE — Progress Notes (Signed)
Advanced Heart Failure Rounding Note   Subjective:    Awake on vent.   Remains on Impella. Flow 2.9. Now on levophed 20 and milrinone 0.5 Urine ouput up to 60cc/hr. Renal function stable.  Getting 2u RBCs now. Pulsatility improved on Impella.   Ernestine ConradSwan numbers done personally at bedside.  RA 18 PA  27/18 (22) PCWP 21 Thermo CO/CI  4.9/2.3 SVR 995  Bedside echo shows Impella in good position. 3.6cm   Objective:   Weight Range:  Vital Signs:   Temp:  [98.8 F (37.1 C)-99.5 F (37.5 C)] 98.8 F (37.1 C) (02/03 1100) Pulse Rate:  [114-125] 114 (02/03 1100) Resp:  [20-25] 23 (02/03 1100) BP: (84-109)/(62-75) 86/64 mmHg (02/03 1100) SpO2:  [89 %-98 %] 95 % (02/03 1100) FiO2 (%):  [40 %] 40 % (02/03 1010) Weight:  [105.4 kg (232 lb 5.8 oz)] 105.4 kg (232 lb 5.8 oz) (02/03 0500) Last BM Date:  (unknown)  Weight change: Filed Weights   04/02/14 0351 04/03/14 0500 04/04/14 0500  Weight: 103.8 kg (228 lb 13.4 oz) 106.3 kg (234 lb 5.6 oz) 105.4 kg (232 lb 5.8 oz)    Intake/Output:   Intake/Output Summary (Last 24 hours) at 04/04/14 1133 Last data filed at 04/04/14 1100  Gross per 24 hour  Intake 2913.96 ml  Output   1615 ml  Net 1298.96 ml     Physical Exam: General:  Intubated. 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  Cor: PMI laterally displaced. Tachy regular +s3 Lungs: clear Abdomen: soft, nontender, nondistended. No hepatosplenomegaly. No bruits or masses. Hypoactive bowel sounds. Extremities: no cyanosis, clubbing, rash, 1-2+ edema. R groin Swan and impella ok Neuro: awake on vent  Telemetry: Sinus tach 110-120s  Labs: Basic Metabolic Panel:  Recent Labs Lab Apr 23, 2014 1200 Apr 23, 2014 2300 04/02/14 0333 04/03/14 0347 04/04/14 0415  NA 136 134* 134* 134* 135  K 4.2 4.2 4.3 3.7 3.6  CL 112 110 112 110 111  CO2 19 20 19 20 21   GLUCOSE 174* 221* 233* 225* 223*  BUN 8 11 11 13  24*   CREATININE 1.11 1.23 1.18 0.99 0.89  CALCIUM 7.1* 7.3* 7.2* 7.4* 7.1*  MG  --   --  1.6  --   --   PHOS  --   --  1.8*  --   --     Liver Function Tests:  Recent Labs Lab 04/02/14 0333 04/04/14 0415  AST 435* 117*  ALT 133* 100*  ALKPHOS 102 112  BILITOT 2.9* 1.7*  PROT 5.0* 4.7*  ALBUMIN 2.0* 1.8*   No results for input(s): LIPASE, AMYLASE in the last 168 hours. No results for input(s): AMMONIA in the last 168 hours.  CBC:  Recent Labs Lab Apr 23, 2014 0142  Apr 23, 2014 2300 04/02/14 0333 04/02/14 1840 04/03/14 0347 04/04/14 0415  WBC 15.4*  < > 17.5* 19.8* 18.1* 16.7* 14.5*  NEUTROABS 12.2*  --   --  15.1*  --  12.3* 10.7*  HGB 15.0  < > 11.7* 11.6* 10.0* 10.4* 9.0*  HCT 43.0  < > 34.7* 33.7* 29.1* 30.3* 26.7*  MCV 87.4  < > 88.1 88.5 86.4 85.1 86.1  PLT 279  < > 237 242 201 164 123*  < > = values in this interval not displayed.  Cardiac Enzymes:  Recent Labs Lab Apr 23, 2014 1207 Apr 23, 2014 1843 04/02/14 0333  TROPONINI >80.00* >81.00* >80.00*    BNP: BNP (last 3 results) No results for input(s):  PROBNP in the last 8760 hours.   Other results:    Imaging: Dg Chest Port 1 View  04/04/2014   CLINICAL DATA:  Cardiogenic shock.  EXAM: PORTABLE CHEST - 1 VIEW  COMPARISON:  04/03/2014.  FINDINGS: Endotracheal tube and NG tube in stable position. Swan-Ganz catheter stable position. Swan-Ganz catheter is straightened. Stable cardiomegaly. Low lung volumes with mild basilar and left upper lobe atelectasis. No pleural effusion or pneumothorax.  IMPRESSION: 1. Lines and tubes in stable position. Femoral Swan-Ganz catheter stable position. The catheter has straightened. 2. Stable cardiomegaly .  No pulmonary venous congestion. 3. Low lung volumes with persistent bibasilar and left upper lobe atelectasis.   Electronically Signed   By: Maisie Fus  Register   On: 04/04/2014 07:15   Dg Chest Port 1 View  04/03/2014   CLINICAL DATA:  Respiratory failure.  EXAM: PORTABLE CHEST - 1  VIEW  COMPARISON:  04/02/2014.  FINDINGS: Endotracheal tube and NG tube in stable position. Swan-Ganz catheter in stable position with tip in right main pulmonary artery. Slight curl is again noted in the Swan-Ganz catheter. Stable cardiomegaly. Low lung volumes with bibasilar mild atelectasis and/or infiltrates. No pleural effusion or pneumothorax.  IMPRESSION: 1. Lines and tubes in stable position. Right femoral Swan-Ganz catheter unchanged with its tip in the right main pulmonary artery. 2. Stable cardiomegaly.  No pulmonary venous congestion. 3. Low lung volumes with mild bibasilar atelectasis and/or infiltrates.   Electronically Signed   By: Maisie Fus  Register   On: 04/03/2014 07:27     Medications:     Scheduled Medications: . antiseptic oral rinse  7 mL Mouth Rinse QID  . aspirin  81 mg Per Tube Daily  . atorvastatin  80 mg Per Tube q1800  . chlorhexidine  15 mL Mouth Rinse BID  . digoxin  0.25 mg Intravenous Daily  . feeding supplement (PRO-STAT SUGAR FREE 64)  60 mL Per Tube QID  . feeding supplement (VITAL HIGH PROTEIN)  1,000 mL Per Tube Q24H  . furosemide  40 mg Intravenous Once  . impella catheter heparin 50 unit/mL  25,000 Units Intracatheter Q24H  . insulin aspart  0-15 Units Subcutaneous 6 times per day  . insulin glargine  10 Units Subcutaneous Once  . [START ON 04/16/2014] insulin glargine  35 Units Subcutaneous Daily  . [START ON 04/26/2014] multivitamin  5 mL Per Tube Daily  . pantoprazole (PROTONIX) IV  40 mg Intravenous Q12H  . sodium chloride  3 mL Intravenous Q12H  . ticagrelor  90 mg Per Tube BID    Infusions: . sodium chloride 1,000 mL (03/22/2014 0700)  . fentaNYL infusion INTRAVENOUS 50 mcg/hr (04/04/14 0500)  . heparin 1,700 Units/hr (04/04/14 0500)  . midazolam (VERSED) infusion 3 mg/hr (04/04/14 0500)  . milrinone 0.5 mcg/kg/min (04/04/14 0915)  . norepinephrine (LEVOPHED) Adult infusion 16 mcg/min (04/04/14 0500)    PRN Medications: Place/Maintain arterial  line **AND** sodium chloride, sodium chloride, sodium chloride, acetaminophen, fentaNYL, Influenza vac split quadrivalent PF, midazolam, ondansetron (ZOFRAN) IV, pneumococcal 23 valent vaccine, sodium chloride   Assessment:   1. Cardiogenic shock    --s/p Impella placement 1/31 2. Acute on chronic systolic HF    --EF 10% on echo 3. Anterolateral STEMI 1/31   --due to stent occlusion   --s/p PCI with DES to LAD and OM-2 4. VDRF 5. CAD 6. Tobacco abuse ongoing 7. 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 Hypomagnesemia/hypnatremia 9. Shock liver 10. Anemia   Plan/Discussion:  He continues to improve slowly with Impella and dual inotrope support. On bedside echo this am which I did personally - Impella well positioned and LVEF responding well to support. Renal function currently stable. Oxygenation is good. Pulsatility on Impella much improved suggesting improving LV output. Continue milrinone and levophed. Will give another does of lasix today. Begin Impella wean tomorrow.  Continue heparin and Brillinta.   Long talk with daughter Morrie Sheldon about situation. D/w Dr [name] at bedside.  Not transplant candidate due to current tobacco use. Likely not good VAD candidate due to social situation.   The patient is critically ill with multiple organ systems failure and requires high complexity decision making for assessment and support, frequent evaluation and titration of therapies, application of advanced monitoring technologies and extensive interpretation of multiple databases.   Critical Care Time devoted to patient care services described in this note is 45 Minutes.  Length of Stay: 3   Arvilla Meres MD 04/04/2014, 11:33 AM  Advanced Heart Failure Team Pager [phone] (M-F; 7a - 4p)  Please contact [facility] for night-coverage after hours (4p -7a ) and weekends on [email or web address]

## 2014-04-04 NOTE — Progress Notes (Signed)
Nursing assessment underway at 2000. Paused tube feeding to assess NGT placement and gastric residual. Asculateded injection of air over the RUQ. Gastric residual assessed, pulled out 800 ml of black/dark brown fluid. Pushed Elink button , Donnamarie PoagJeanne RN camera in and updated her on current findings. Tube feedings stopped and NTG placed to low intermitted wall suction. Dr. Dema SeverinMungal made aware of findings.

## 2014-04-04 NOTE — Progress Notes (Signed)
PULMONARY / CRITICAL CARE MEDICINE   Name: Micheal Ayala MRN: [id number] DOB: 11/09/1955    ADMISSION DATE:  03/11/2014 CONSULTATION DATE:  03/02/2014  REFERRING MD :  Dr. Herbie BaltimoreHarding  CHIEF COMPLAINT:  STEMI  INITIAL PRESENTATION: 39M smoker male visiting [place] from [place] with HTN, CAD s/p STEMI 2002, presenting to ED via EMS as code STEMI with anterior and inferior STEMI. Initially hemodynically stable in the field, then severe respiratory distress on arrival to ED and hypotensive. Started on pressors. Taken emergently to cath lab for STEMI and cardiogenic shock. PCCM consulted for respiratory distress and vent management.   STUDIES:  1/31 Cath and PCI: Severe multivessel disease with occluded LAD and Codominant Circumflex with severe disease in the RCA and OM1.  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  Cardiac function currently being supported by 3.5 mL Impella.  Successful 2 vessel PCI of the early mid LAD and mid circumflex into OM 2 with Synergy DES stents. 1/31 Echo > LVEF 20-25%, severely reduced systolic function  SIGNIFICANT EVENTS: 1/31: Chest pain x2 hours, called EMS, code STEMI, hypotensive and resp distress on arrival to ED, taken emergently to cath lab, 2 vessel PCI of LAD and Circumflex, Impella insertion, on pressors, remains intubated.    SUBJECTIVE: vent needs lower, levo reduced  VITAL SIGNS: Temp:  [98.8 F (37.1 C)-99.5 F (37.5 C)] 98.8 F (37.1 C) (02/03 1100) Pulse Rate:  [114-125] 114 (02/03 1100) Resp:  [20-25] 23 (02/03 1100) BP: (84-109)/(62-75) 86/64 mmHg (02/03 1100) SpO2:  [89 %-98 %] 95 % (02/03 1100) FiO2 (%):  [40 %] 40 % (02/03 1010) Weight:  [105.4 kg (232 lb 5.8 oz)] 105.4 kg (232 lb 5.8 oz) (02/03 0500) HEMODYNAMICS: PAP: (25-35)/(15-25) 29/19 mmHg CVP:  [12 mmHg-23 mmHg] 16 mmHg PCWP:  [18 mmHg-21 mmHg] 21 mmHg CO:  [4.9 L/min-5.2 L/min] 4.9 L/min CI:  [2.3  L/min/m2-2.4 L/min/m2] 2.3 L/min/m2 VENTILATOR SETTINGS: Vent Mode:  [-] PRVC FiO2 (%):  [40 %] 40 % Set Rate:  [18 bmp] 18 bmp Vt Set:  [600 mL] 600 mL PEEP:  [5 cmH20] 5 cmH20 Plateau Pressure:  [19 cmH20-21 cmH20] 20 cmH20 INTAKE / OUTPUT:  Intake/Output Summary (Last 24 hours) at 04/04/14 1114 Last data filed at 04/04/14 1100  Gross per 24 hour  Intake 2913.96 ml  Output   1615 ml  Net 1298.96 ml   PHYSICAL EXAMINATION: General:  Sedated on vent, rass -2 HEENT: jvd noted still PULM; coarse throughout, inspiration CV: s1 s2 rrr AB: BS+, soft, nontender Ext: warm, increased edema Neuro: sedated, rass -2  LABS:  CBC  Recent Labs Lab 04/02/14 1840 04/03/14 0347 04/04/14 0415  WBC 18.1* 16.7* 14.5*  HGB 10.0* 10.4* 9.0*  HCT 29.1* 30.3* 26.7*  PLT 201 164 123*   Coag's  Recent Labs Lab 03/30/2014 0142 04/02/14 0333 04/03/14 0347 04/04/14 0415  APTT 138* 77* 93* 132*  INR 1.18  --   --   --    BMET  Recent Labs Lab 04/02/14 0333 04/03/14 0347 04/04/14 0415  NA 134* 134* 135  K 4.3 3.7 3.6  CL 112 110 111  CO2 19 20 21   BUN 11 13 24*  CREATININE 1.18 0.99 0.89  GLUCOSE 233* 225* 223*   Electrolytes  Recent Labs Lab 04/02/14 0333 04/03/14 0347 04/04/14 0415  CALCIUM 7.2* 7.4* 7.1*  MG 1.6  --   --   PHOS 1.8*  --   --  ABG  Recent Labs Lab 04/02/14 0305 04/03/14 0416 04/04/14 0430  PHART 7.394 7.427 7.434  PCO2ART 27.3* 27.7* 33.3*  PO2ART 162.0* 142.0* 83.9   Glucose  Recent Labs Lab 04/03/14 1142 04/03/14 1540 04/03/14 2022 04/03/14 2347 04/04/14 0415 04/04/14 0734  GLUCAP 237* 257* 237* 258* 229* 205*   ASSESSMENT / PLAN:  PULMONARY OETT 1/31>>> A: Acute respiratory failure due to cardiogenic shock> CXR 2/1 remarkably normal and improving P:   Continue full vent support 40%, peep 5, rate 18, 600, abg reviewed Drop rate slight No wean until cleared by chf service, impella etc Lasix per  cards  CARDIOVASCULAR R arterial sheath and swan 1/31>>> L Impella 1/31>>> A:  Cardiogenic shock post STEMI Cath 1/31: severe multivessel disease with occluded LAD and Circumflex with severe disease in RCA and OM1.  S/p 2 vessel PCI of early mid LAD and mid circumflex. S/p Impella insertion.  Severe ischemic cardiomyopathy P:  Impella/milrinone/iontropes/Coox/CVP/anti-platelets per cardiology Tele pcxr with clearing of edema  RENAL A:  Hyponatremia AKI--improving Mild hyperchloremia P:   kvo agree Urine output better with increasing index  GASTROINTESTINAL A:  Congestive hepatopathy, concern for gastric stress P:   tube feedings on going Protonix to bid lft improcing  HEMATOLOGIC A:  Thrombocytopenia ( hemolysis?) P:  Monitor Hb, s/p cath On Heparin gtt, Aggrastat, and Brilinta per cardiology Send hemolysis work up, cbc in pm   INFECTIOUS A:  Fever/Leukocytosis post MI 1/31, started on Vanc empirically per pharmacy notes, no blood culture sent P:    2/1 blood cx >> 1/31 vanc >>2/3  Dc vanc  ENDOCRINE A:  Hyperglycemia worse Likely diabetic  P:   Ssi Increase lantus further  NEUROLOGIC A:  Sedated on mechanical ventilation, following commands P:   RASS goal: -1  Fent and versed gtt Very anxious to dc versed, when able reluctant to wua again today   FAMILY  - Updates: No family by bedside on my exam 2/1  - Inter-disciplinary family meet or Palliative Care meeting due by:  day 7  Ccm time 30 min   [name]. [name], MD, FACP Pgr: [phone] [facility]

## 2014-04-05 ENCOUNTER — Encounter (HOSPITAL_COMMUNITY): Admission: EM | Disposition: E | Payer: Self-pay | Source: Home / Self Care | Attending: Internal Medicine

## 2014-04-05 DIAGNOSIS — I2510 Atherosclerotic heart disease of native coronary artery without angina pectoris: Secondary | ICD-10-CM

## 2014-04-05 HISTORY — PX: LEFT HEART CATHETERIZATION WITH CORONARY ANGIOGRAM: SHX5451

## 2014-04-05 LAB — BLOOD GAS, ARTERIAL
ACID-BASE EXCESS: 0.5 mmol/L (ref 0.0–2.0)
Bicarbonate: 23.9 mEq/L (ref 20.0–24.0)
FIO2: 0.4 %
MECHVT: 600 mL
O2 Saturation: 96.7 %
PCO2 ART: 33.6 mmHg — AB (ref 35.0–45.0)
PEEP/CPAP: 5 cmH2O
Patient temperature: 98.6
RATE: 14 resp/min
TCO2: 24.9 mmol/L (ref 0–100)
pH, Arterial: 7.466 — ABNORMAL HIGH (ref 7.350–7.450)
pO2, Arterial: 84.5 mmHg (ref 80.0–100.0)

## 2014-04-05 LAB — COMPREHENSIVE METABOLIC PANEL
ALT: 80 U/L — ABNORMAL HIGH (ref 0–53)
AST: 95 U/L — ABNORMAL HIGH (ref 0–37)
Albumin: 1.9 g/dL — ABNORMAL LOW (ref 3.5–5.2)
Alkaline Phosphatase: 138 U/L — ABNORMAL HIGH (ref 39–117)
Anion gap: 7 (ref 5–15)
BUN: 26 mg/dL — ABNORMAL HIGH (ref 6–23)
CO2: 23 mmol/L (ref 19–32)
Calcium: 7.5 mg/dL — ABNORMAL LOW (ref 8.4–10.5)
Chloride: 105 mmol/L (ref 96–112)
Creatinine, Ser: 0.89 mg/dL (ref 0.50–1.35)
GFR calc non Af Amer: >90 mL/min (ref >90)
Glucose, Bld: 145 mg/dL — ABNORMAL HIGH (ref 70–99)
POTASSIUM: 3.7 mmol/L (ref 3.5–5.1)
Sodium: 135 mmol/L (ref 135–145)
Total Bilirubin: 2.3 mg/dL — ABNORMAL HIGH (ref 0.3–1.2)
Total Protein: 5.1 g/dL — ABNORMAL LOW (ref 6.0–8.3)

## 2014-04-05 LAB — TYPE AND SCREEN
ABO/RH(D): O POS
Antibody Screen: NEGATIVE
UNIT DIVISION: 0
Unit division: 0
Unit division: 0

## 2014-04-05 LAB — CBC WITH DIFFERENTIAL/PLATELET
BASOS ABS: 0.1 10*3/uL (ref 0.0–0.1)
Basophils Relative: 0 % (ref 0–1)
EOS ABS: 0.1 10*3/uL (ref 0.0–0.7)
Eosinophils Relative: 1 % (ref 0–5)
HCT: 29.7 % — ABNORMAL LOW (ref 39.0–52.0)
Hemoglobin: 10.3 g/dL — ABNORMAL LOW (ref 13.0–17.0)
LYMPHS ABS: 2.3 10*3/uL (ref 0.7–4.0)
Lymphocytes Relative: 18 % (ref 12–46)
MCH: 29.9 pg (ref 26.0–34.0)
MCHC: 34.7 g/dL (ref 30.0–36.0)
MCV: 86.1 fL (ref 78.0–100.0)
MONO ABS: 1.2 10*3/uL — AB (ref 0.1–1.0)
Monocytes Relative: 9 % (ref 3–12)
NEUTROS ABS: 9.2 10*3/uL — AB (ref 1.7–7.7)
NEUTROS PCT: 72 % (ref 43–77)
Platelets: 87 10*3/uL — ABNORMAL LOW (ref 150–400)
RBC: 3.45 MIL/uL — ABNORMAL LOW (ref 4.22–5.81)
RDW: 14.3 % (ref 11.5–15.5)
WBC: 12.8 10*3/uL — ABNORMAL HIGH (ref 4.0–10.5)

## 2014-04-05 LAB — CARBOXYHEMOGLOBIN
CARBOXYHEMOGLOBIN: 1.3 % (ref 0.5–1.5)
METHEMOGLOBIN: 0.9 % (ref 0.0–1.5)
O2 Saturation: 62.5 %
Total hemoglobin: 11.1 g/dL — ABNORMAL LOW (ref 13.5–18.0)

## 2014-04-05 LAB — POCT ACTIVATED CLOTTING TIME
ACTIVATED CLOTTING TIME: 153 s
ACTIVATED CLOTTING TIME: 159 s
ACTIVATED CLOTTING TIME: 171 s
ACTIVATED CLOTTING TIME: 177 s
Activated Clotting Time: 177 seconds
Activated Clotting Time: 183 seconds
Activated Clotting Time: 177 seconds
Activated Clotting Time: 300 seconds
Activated Clotting Time: 147 seconds

## 2014-04-05 LAB — GLUCOSE, CAPILLARY
GLUCOSE-CAPILLARY: 151 mg/dL — AB (ref 70–99)
Glucose-Capillary: 135 mg/dL — ABNORMAL HIGH (ref 70–99)
Glucose-Capillary: 137 mg/dL — ABNORMAL HIGH (ref 70–99)
Glucose-Capillary: 131 mg/dL — ABNORMAL HIGH (ref 70–99)
Glucose-Capillary: 128 mg/dL — ABNORMAL HIGH (ref 70–99)

## 2014-04-05 LAB — HAPTOGLOBIN: Haptoglobin: <10 mg/dL — ABNORMAL LOW (ref 34–200)

## 2014-04-05 LAB — APTT: APTT: 128 s — AB (ref 24–37)

## 2014-04-05 LAB — PROCALCITONIN: PROCALCITONIN: 1.1 ng/mL

## 2014-04-05 LAB — LACTATE DEHYDROGENASE: LDH: 1310 U/L — ABNORMAL HIGH (ref 94–250)

## 2014-04-05 SURGERY — LEFT HEART CATHETERIZATION WITH CORONARY ANGIOGRAM

## 2014-04-05 MED ORDER — HEPARIN (PORCINE) IN NACL 100-0.45 UNIT/ML-% IJ SOLN
1000.0000 [IU]/h | INTRAMUSCULAR | Status: DC
  Administered 2014-04-05: 1700 [IU]/h via INTRAVENOUS
  Administered 2014-04-05: 1800 [IU]/h via INTRAVENOUS
  Filled 2014-04-05: qty 250

## 2014-04-05 MED ORDER — HEPARIN (PORCINE) IN NACL 100-0.45 UNIT/ML-% IJ SOLN
1000.0000 [IU]/h | INTRAMUSCULAR | Status: DC
  Administered 2014-04-05: 1700 [IU]/h via INTRAVENOUS

## 2014-04-05 MED ORDER — VERAPAMIL HCL 2.5 MG/ML IV SOLN
INTRAVENOUS | Status: AC
  Filled 2014-04-05: qty 2

## 2014-04-05 MED ORDER — DOCUSATE SODIUM 50 MG/5ML PO LIQD
100.0000 mg | Freq: Two times a day (BID) | ORAL | Status: DC
  Administered 2014-04-05 – 2014-05-01 (×50): 100 mg via ORAL
  Filled 2014-04-05 (×54): qty 10

## 2014-04-05 MED ORDER — POLYETHYLENE GLYCOL 3350 17 G PO PACK
17.0000 g | PACK | Freq: Two times a day (BID) | ORAL | Status: AC
  Administered 2014-04-05 (×2): 17 g via ORAL
  Filled 2014-04-05 (×2): qty 1

## 2014-04-05 MED ORDER — AMIODARONE HCL IN DEXTROSE 360-4.14 MG/200ML-% IV SOLN
30.0000 mg/h | INTRAVENOUS | Status: DC
  Administered 2014-04-06 – 2014-04-10 (×9): 30 mg/h via INTRAVENOUS
  Administered 2014-04-10 – 2014-04-11 (×5): 60 mg/h via INTRAVENOUS
  Administered 2014-04-12 – 2014-04-16 (×8): 30 mg/h via INTRAVENOUS
  Administered 2014-04-16 – 2014-04-18 (×6): 60 mg/h via INTRAVENOUS
  Administered 2014-04-18 – 2014-04-28 (×19): 30 mg/h via INTRAVENOUS
  Filled 2014-04-05 (×110): qty 200

## 2014-04-05 MED ORDER — AMIODARONE LOAD VIA INFUSION
150.0000 mg | Freq: Once | INTRAVENOUS | Status: AC
  Administered 2014-04-05: 150 mg via INTRAVENOUS
  Filled 2014-04-05: qty 83.34

## 2014-04-05 MED ORDER — HEPARIN (PORCINE) IN NACL 2-0.9 UNIT/ML-% IJ SOLN
INTRAMUSCULAR | Status: AC
  Filled 2014-04-05: qty 1000

## 2014-04-05 MED ORDER — NITROGLYCERIN 1 MG/10 ML FOR IR/CATH LAB
INTRA_ARTERIAL | Status: AC
  Filled 2014-04-05: qty 10

## 2014-04-05 MED ORDER — HEPARIN SODIUM (PORCINE) 1000 UNIT/ML IJ SOLN
INTRAMUSCULAR | Status: AC
  Filled 2014-04-05: qty 1

## 2014-04-05 MED ORDER — LIDOCAINE HCL (PF) 1 % IJ SOLN
INTRAMUSCULAR | Status: AC
  Filled 2014-04-05: qty 30

## 2014-04-05 MED ORDER — AMIODARONE HCL IN DEXTROSE 360-4.14 MG/200ML-% IV SOLN
60.0000 mg/h | INTRAVENOUS | Status: AC
  Administered 2014-04-05: 60 mg/h via INTRAVENOUS
  Filled 2014-04-05 (×3): qty 200

## 2014-04-05 NOTE — CV Procedure (Signed)
      Cardiac Catheterization Operative Report  Job FoundsFrederick Bandel [id number] 2/4/20164:44 PM No primary care provider on file.  Procedure Performed:  1. Percutaneous coronary intervention of the RCA  Operator: Verne Carrowhristopher Laithan Conchas, MD  Arterial access site:  Right radial artery.   Indication: 59 yo male admitted 03/18/2014 with anterior STEMI. He was found to have occluded mid LAD and mid Circumflex. Dr. Herbie BaltimoreHarding performed his initial PCI. DES placed in the mid LAD and the mid Circumflex. He was in cardiogenic shock and an Impella LV support device was placed. He is currently still in shock with pressor therapy, Impella in place, intubated on ventilator and sedated. Dr. Herbie BaltimoreHarding and Dr. Gala RomneyBensimhon have reviewed his cath films today and feel that the severe stenosis in the proximal RCA is likely contributing to his ongoing cardiogenic shock and that improved flow to the RV may improve his hemodynamics. PCI of the RCA is planned this evening while the Impella support device is in place.  There are plans to remove the Impella tonight.                                      Procedure Details: The risks, benefits, complications, treatment options, and expected outcomes were discussed with the patient's family. The patient and/or family concurred with the proposed plan, giving informed consent. The patient was further sedated with Versed and Fentanyl drips. The right wrist was assessed with a modified Allens test which was positive. The right wrist was prepped and draped in a sterile fashion. 1% lidocaine was used for local anesthesia. Using the modified Seldinger access technique, a 5/6 French sheath was placed in the right radial artery. 3 mg Verapamil was given through the sheath. 10,000 units IV heparin was given. I then engaged the RCA with a JR4 guiding catheter. When the ACT was over 200, I passed a Cougar IC wire down the RCA. I then used a 2.0 x 15 mm balloon to pre-dilate the proximal stenosis. A 2.5  x 28 mm Synergy DES was deployed in the proximal RCA. The stent was post-dilated with a 2.5 x 20 mm Clifton balloon x 2. The stenosis was taken from 90% down to 0%.    The sheath was removed from the right radial artery and a Terumo hemostasis band was applied at the arteriotomy site on the right wrist. There were no immediate complications. The patient was taken to the recovery area in stable condition.   Hemodynamic Findings: Central aortic pressure: 99/73  Impression: 1. Severe stenosis RCA with ongoing cardiogenic shock 2. Successful PTCA/DES x 1 proximal RCA  Recommendations: Continue ASA and Brilinta for one year. He will be taken back to the CCU tonight with Impella in place. He is on Levophed. Further management of pressors and Impella per CHF team.        Complications:  None. The patient tolerated the procedure well.

## 2014-04-05 NOTE — Progress Notes (Signed)
Notified by nursing staff regarding episodic bursts of tachycardic episode. Patient who is in cardiogenic shock, intubated, with recent PCI of mid LAD and mid LCx, and also PCI of RCA as well. On impella and levophed.   Tachycardia likely driven by cardiogenic shock and levophed. However his BP would not allow weaning of pressor. Would continue monitor for now.   Ramond DialSigned, Mathan Darroch PA Pager: [phone]

## 2014-04-05 NOTE — Progress Notes (Signed)
Patient's bursts of SVT appearing more frequently and longer in duration. Dr. Gala RomneyBensimhon called and made aware. New orders for amiodarone infusion with loading dose. Also ordered to change flow control up to P-7, and back to P-5 at 0500 tomorrow morning. Will continue to monitor patient closely.

## 2014-04-05 NOTE — Progress Notes (Signed)
NUTRITION FOLLOW-UP  INTERVENTION:  Once ready to resume TF:  Initiate Vital HP at 56m/hr  Initiate 693mProStat QID Provides 1400 kcals (64% of needs), 172 grams Protein, 50198m20  NUTRITION DIAGNOSIS: Inadequate oral intake related to unable to eat as evidenced by NPO status; ongoing.   Goal: Enteral nutrition to provide 60-70% of estimated calorie needs (22-25 kcals/kg ideal body weight) and 100% of estimated protein needs, based on ASPEN guidelines for hypocaloric, high protein feeding in critically ill obese individuals; not met.   Monitor:  TF tolerance, PO status, weight, labs    ASSESSMENT: [age] male smoker visiting from [place] HTN, CAD s/p STEMI 2002, was presented to ED via EMS with severe chest pain, hypertension and shortness of breath. He underwent emergency left and right heart catheterization and placement of a Impella CP ventricular assist device via femoral artery. Occluded prior stents have been replaced with new stents. His condition is slowly stabilizing. Prior to admission, pt had fairly good exercise tolerance and normal activities of daily living.   Patient is currently intubated on ventilator support MV: 10.7 L/min Temp (24hrs), Avg:98.9 F (37.2 C), Min:98.4 F (36.9 C), Max:99.3 F (37.4 C)  Pt discussed during ICU rounds and with RN.  TF held and NG tube turned to suction 2/3 2000 for residuals of 800 ml. Pt has not had a bm, MD ordered Miralax and colace.   Height: Ht Readings from Last 1 Encounters:  03/12/2014 5' 11"  (1.803 m)    Weight: Wt Readings from Last 1 Encounters:  04/08/2014 228 lb 9.9 oz (103.7 kg)  Admission weight: 218 lb (99.1 kg) 1/31  BMI:  Body mass index is 31.9 kg/(m^2). obese  Estimated Nutritional Needs: Kcal: 2200 Protein: 155-175 grams Fluid: 1.8 L daily  Skin: intact  Diet Order: Diet NPO time specified   Intake/Output Summary (Last 24 hours) at 04/20/2014 1113 Last data filed at 04/27/2014 1100  Gross per  24 hour  Intake 2894.73 ml  Output   4915 ml  Net -2020.27 ml    Last BM: PTA  Labs:   Recent Labs Lab 04/02/14 0333  04/04/14 0415 04/04/14 1914 04/12/2014 0347  NA 134*  < > 135 134* 135  K 4.3  < > 3.6 3.3* 3.7  CL 112  < > 111 105 105  CO2 19  < > 21 22 23   BUN 11  < > 24* 30* 26*  CREATININE 1.18  < > 0.89 0.90 0.89  CALCIUM 7.2*  < > 7.1* 7.5* 7.5*  MG 1.6  --   --   --   --   PHOS 1.8*  --   --  1.7*  --   GLUCOSE 233*  < > 223* 203* 145*  < > = values in this interval not displayed.  CBG (last 3)   Recent Labs  04/24/2014 0001 04/08/2014 0346 04/03/2014 0733  GLUCAP 153* 151* 135*    Scheduled Meds: . antiseptic oral rinse  7 mL Mouth Rinse QID  . aspirin  81 mg Per Tube Daily  . atorvastatin  80 mg Per Tube q1800  . chlorhexidine  15 mL Mouth Rinse BID  . digoxin  0.25 mg Intravenous Daily  . docusate  100 mg Oral BID  . feeding supplement (PRO-STAT SUGAR FREE 64)  60 mL Per Tube QID  . feeding supplement (VITAL HIGH PROTEIN)  1,000 mL Per Tube Q24H  . impella catheter heparin 50 unit/mL  25,000 Units Intracatheter Q24H  .  insulin aspart  0-15 Units Subcutaneous 6 times per day  . insulin glargine  35 Units Subcutaneous Daily  . multivitamin  5 mL Per Tube Daily  . pantoprazole (PROTONIX) IV  40 mg Intravenous Q12H  . polyethylene glycol  17 g Oral BID  . sodium chloride  3 mL Intravenous Q12H  . ticagrelor  90 mg Per Tube BID    Continuous Infusions: . sodium chloride 1,000 mL (03/28/2014 0700)  . fentaNYL infusion INTRAVENOUS 25 mcg/hr (04/09/2014 0806)  . heparin 1,700 Units/hr (04/04/2014 0700)  . midazolam (VERSED) infusion 1 mg/hr (04/04/2014 0848)  . milrinone 0.5 mcg/kg/min (04/04/2014 1051)  . norepinephrine (LEVOPHED) Adult infusion Stopped (04/12/2014 0645)   [place], [place], CNSC [phone] Pager [phone] After Hours Pager

## 2014-04-05 NOTE — Progress Notes (Signed)
RT note- Patient is now back from cath lab, placed back on 40%. Large amount of bloody subglotic secretions.

## 2014-04-05 NOTE — Interval H&P Note (Signed)
History and Physical Interval Note:  04/07/2014 4:41 PM  Micheal Ayala  has presented today for percutaneous coronary intervention of the RCA with the diagnosis of CAD, shock.  The various methods of treatment have been discussed with the patient and family. After consideration of risks, benefits and other options for treatment, the patient has consented to  Procedure(s): RELOOK/POSSIBLE PCI (N/A) as a surgical intervention .  The patient's history has been reviewed, patient examined, no change in status, stable for surgery.  I have reviewed the patient's chart and labs.  Questions were answered to the patient's satisfaction.    Cath Lab Visit (complete for each Cath Lab visit)  Clinical Evaluation Leading to the Procedure:   ACS: yes  Non-ACS:    Anginal Classification: CCS IV  Anti-ischemic medical therapy: No Therapy  Non-Invasive Test Results: No non-invasive testing performed  Prior CABG: No previous CABG   [name]

## 2014-04-05 NOTE — Progress Notes (Signed)
RT note-decreased ventilator rate 12

## 2014-04-05 NOTE — H&P (View-Only) (Signed)
Advanced Heart Failure Rounding Note   Subjective:    Awake on vent.   Remains on Impella. Flow 2.9. On milrinone 0.5. Levophed weaned off this am Got 2units RBCs yesterday and  IV lasix. Excellent urine output. Weight down 4 pounds.  Episode of SVT today. I performed carotid massage and broke to sinus.  Ernestine Conrad numbers done personally at bedside.  RA 12 PA  29/18 (22) PCWP 17 Thermo CO/CI  5.1/2.4 SVR 995  Bedside echo shows Impella in good position. 3.6  cm   Objective:   Weight Range:  Vital Signs:   Temp:  [98.4 F (36.9 C)-99.3 F (37.4 C)] 99 F (37.2 C) (02/04 1100) Pulse Rate:  [106-117] 115 (02/04 1200) Resp:  [15-24] 18 (02/04 1200) BP: (87-113)/(63-86) 87/69 mmHg (02/04 1200) SpO2:  [92 %-99 %] 98 % (02/04 1200) FiO2 (%):  [40 %] 40 % (02/04 0753) Weight:  [103.7 kg (228 lb 9.9 oz)] 103.7 kg (228 lb 9.9 oz) (02/04 0600) Last BM Date:  (PTA)  Weight change: Filed Weights   04/03/14 0500 04/04/14 0500 04/19/2014 0600  Weight: 106.3 kg (234 lb 5.6 oz) 105.4 kg (232 lb 5.8 oz) 103.7 kg (228 lb 9.9 oz)    Intake/Output:   Intake/Output Summary (Last 24 hours) at 04/19/2014 1209 Last data filed at 04/18/2014 1200  Gross per 24 hour  Intake 3000.23 ml  Output   5055 ml  Net -2054.77 ml     Physical Exam: General:  Intubated. Awakens to voice HEENT: normal except for R eye enucleation  NGT with bloody drainage Neck: supple.Carotids 2+ bilat; no bruits.  Cor: PMI laterally displaced. Tachy regular +s3 Lungs: clear Abdomen: soft, nontender, nondistended. No hepatosplenomegaly. No bruits or masses. Hypoactive bowel sounds. Extremities: no cyanosis, clubbing, rash, trace- 1+ edema. R groin Swan and impella ok Neuro: awake on vent  Telemetry: Sinus tach 110-120s  Labs: Basic Metabolic Panel:  Recent Labs Lab 04/02/14 0333 04/03/14 0347 04/04/14 0415 04/04/14 1914 04/08/2014 0347  NA 134* 134* 135 134* 135  K 4.3 3.7 3.6 3.3* 3.7  CL 112 110 111 105  105  CO2 GLUCOSE 233* 225* 223* 203* 145*  BUN 11 13 24* 30* 26*  CREATININE 1.18 0.99 0.89 0.90 0.89  CALCIUM 7.2* 7.4* 7.1* 7.5* 7.5*  MG 1.6  --   --   --   --   PHOS 1.8*  --   --  1.7*  --     Liver Function Tests:  Recent Labs Lab 04/02/14 0333 04/04/14 0415 04/04/14 1146 04/04/14 1914 04/17/2014 0347  AST 435* 117*  --   --  95*  ALT 133* 100*  --   --  80*  ALKPHOS 102 112  --   --  138*  BILITOT 2.9* 1.7* 1.8*  --  2.3*  PROT 5.0* 4.7*  --   --  5.1*  ALBUMIN 2.0* 1.8*  --  2.0* 1.9*   No results for input(s): LIPASE, AMYLASE in the last 168 hours. No results for input(s): AMMONIA in the last 168 hours.  CBC:  Recent Labs Lab 04-10-14 0142  04/02/14 0333  04/03/14 0347 04/04/14 0415 04/04/14 1600 04/04/14 1937 04/20/2014 0347  WBC 15.4*  < > 19.8*  < > 16.7* 14.5* 15.9* 13.0* 12.8*  NEUTROABS 12.2*  --  15.1*  --  12.3* 10.7*  --   --  9.2*  HGB 15.0  < > 11.6*  < > 10.4* 9.0*  11.3* 10.3* 10.3*  HCT 43.0  < > 33.7*  < > 30.3* 26.7* 32.0* 30.3* 29.7*  MCV 87.4  < > 88.5  < > 85.1 86.1 86.3 86.3 86.1  PLT 279  < > 242  < > 164 123* 111* 93* 87*  < > = values in this interval not displayed.  Cardiac Enzymes:  Recent Labs Lab 03/16/14 1207 03/16/14 1843 04/02/14 0333  TROPONINI >80.00* >81.00* >80.00*    BNP: BNP (last 3 results) No results for input(s): PROBNP in the last 8760 hours.   Other results:    Imaging: Dg Chest Port 1 View  04/04/2014   CLINICAL DATA:  Cardiogenic shock.  EXAM: PORTABLE CHEST - 1 VIEW  COMPARISON:  04/03/2014.  FINDINGS: Endotracheal tube and NG tube in stable position. Swan-Ganz catheter stable position. Swan-Ganz catheter is straightened. Stable cardiomegaly. Low lung volumes with mild basilar and left upper lobe atelectasis. No pleural effusion or pneumothorax.  IMPRESSION: 1. Lines and tubes in stable position. Femoral Swan-Ganz catheter stable position. The catheter has straightened. 2. Stable  cardiomegaly .  No pulmonary venous congestion. 3. Low lung volumes with persistent bibasilar and left upper lobe atelectasis.   Electronically Signed   By: Maisie Fushomas  Register   On: 04/04/2014 07:15   Dg Abd Portable 1v  04/04/2014   CLINICAL DATA:  59 year old male with 1 day history of abdominal pain and distension  EXAM: PORTABLE ABDOMEN - 1 VIEW  COMPARISON:  Chest x-ray obtained earlier today  FINDINGS: A nasogastric tube is present. The tip projects over the gastric antrum. The bowel gas pattern is not obstructed. Small volume of gas is noted overlying the rectum in the pelvis. No massive free air on these supine radiographs. No organomegaly. No acute osseous abnormality.  IMPRESSION: 1. No evidence of obstruction. 2. Gastric tube in good position.   Electronically Signed   By: Malachy MoanHeath  McCullough M.D.   On: 04/04/2014 21:08     Medications:     Scheduled Medications: . antiseptic oral rinse  7 mL Mouth Rinse QID  . aspirin  81 mg Per Tube Daily  . atorvastatin  80 mg Per Tube q1800  . chlorhexidine  15 mL Mouth Rinse BID  . digoxin  0.25 mg Intravenous Daily  . docusate  100 mg Oral BID  . feeding supplement (PRO-STAT SUGAR FREE 64)  60 mL Per Tube QID  . feeding supplement (VITAL HIGH PROTEIN)  1,000 mL Per Tube Q24H  . impella catheter heparin 50 unit/mL  25,000 Units Intracatheter Q24H  . insulin aspart  0-15 Units Subcutaneous 6 times per day  . insulin glargine  35 Units Subcutaneous Daily  . multivitamin  5 mL Per Tube Daily  . pantoprazole (PROTONIX) IV  40 mg Intravenous Q12H  . polyethylene glycol  17 g Oral BID  . sodium chloride  3 mL Intravenous Q12H  . ticagrelor  90 mg Per Tube BID    Infusions: . sodium chloride 1,000 mL (03/16/14 0700)  . fentaNYL infusion INTRAVENOUS 25 mcg/hr (04/10/2014 0806)  . heparin 1,700 Units/hr (04/28/2014 0700)  . midazolam (VERSED) infusion 1 mg/hr (04/02/2014 0848)  . milrinone 0.5 mcg/kg/min (04/27/2014 1051)  . norepinephrine (LEVOPHED)  Adult infusion Stopped (04/17/2014 0645)    PRN Medications: Place/Maintain arterial line **AND** sodium chloride, sodium chloride, sodium chloride, acetaminophen, fentaNYL, Influenza vac split quadrivalent PF, midazolam, ondansetron (ZOFRAN) IV, pneumococcal 23 valent vaccine, sodium chloride   Assessment:   1. Cardiogenic shock    --s/p  Impella placement 1/31 2. Acute on chronic systolic HF    --EF 10% on echo 3. Anterolateral STEMI 1/31   --due to stent occlusion   --s/p PCI with DES to LAD and OM-2 4. VDRF 5. CAD 6. Tobacco abuse ongoing 7. 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 Hypomagnesemia/hypnatremia 9. Shock liver 10. Anemia   Plan/Discussion:     He continues to improve with Impella and inotrope support. Now off levophed. On bedside echo this am which I did personally - Impella well positioned and LVEF responding well to support. Renal function currently stable. Oxygenation is good. Pulsatility on Impella much improved suggesting improving LV output. I turned Impella down at bedside to P-5 and then P-4 and SBP maintained about 90. CO about 5.5L at P-4 on Swan. Will continue to wean Impella and likely remove later today. Continue milrinone. Will likely need low-dose levophed for support as well.  Continue Brillinta. Will hold heparin for Impella wean. Repeat echo after Impella out. Apex appears akinetic so may benefit from several months of coumadin to prevent LV thrombus formation.  The patient is critically ill with multiple organ systems failure and requires high complexity decision making for assessment and support, frequent evaluation and titration of therapies, application of advanced monitoring technologies and extensive interpretation of multiple databases.   Critical Care Time devoted to patient care services described in this note is 45 Minutes.  Length of Stay: 4   Arvilla Meres MD 04-19-14, 12:09 PM  Advanced Heart Failure  Team Pager [phone] (M-F; 7a - 4p)  Please contact [facility] for night-coverage after hours (4p -7a ) and weekends on [email or web address]

## 2014-04-05 NOTE — Progress Notes (Signed)
PULMONARY / CRITICAL CARE MEDICINE   Name: Micheal Ayala MRN: [id number] DOB: 02/29/1956    ADMISSION DATE:  03/31/2014 CONSULTATION DATE:  03/14/2014  REFERRING MD :  Dr. Herbie BaltimoreHarding  CHIEF COMPLAINT:  STEMI  INITIAL PRESENTATION: 26M smoker male visiting [place] from [place] with HTN, CAD s/p STEMI 2002, presenting to ED via EMS as code STEMI with anterior and inferior STEMI. Initially hemodynically stable in the field, then severe respiratory distress on arrival to ED and hypotensive. Started on pressors. Taken emergently to cath lab for STEMI and cardiogenic shock. PCCM consulted for respiratory distress and vent management.   STUDIES:  1/31 Cath and PCI: Severe multivessel disease with occluded LAD and Codominant Circumflex with severe disease in the RCA and OM1.  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  Cardiac function currently being supported by 3.5 mL Impella.  Successful 2 vessel PCI of the early mid LAD and mid circumflex into OM 2 with Synergy DES stents. 1/31 Echo > LVEF 20-25%, severely reduced systolic function  SIGNIFICANT EVENTS: 1/31: Chest pain x2 hours, called EMS, code STEMI, hypotensive and resp distress on arrival to ED, taken emergently to cath lab, 2 vessel PCI of LAD and Circumflex, Impella insertion, on pressors, remains intubated.  2/3- neg 2 liters, improved CI   SUBJECTIVE: neg balance sedated  VITAL SIGNS: Temp:  [98.4 F (36.9 C)-99.3 F (37.4 C)] 98.6 F (37 C) (02/04 0900) Pulse Rate:  [106-118] 109 (02/04 0900) Resp:  [15-24] 17 (02/04 0900) BP: (89-113)/(63-86) 89/69 mmHg (02/04 0900) SpO2:  [92 %-98 %] 96 % (02/04 0900) FiO2 (%):  [40 %] 40 % (02/04 0753) Weight:  [103.7 kg (228 lb 9.9 oz)] 103.7 kg (228 lb 9.9 oz) (02/04 0600) HEMODYNAMICS: PAP: (26-34)/(18-26) 34/23 mmHg CVP:  [10 mmHg-16 mmHg] 10 mmHg PCWP:  [16 mmHg-18 mmHg] 16 mmHg CO:  [4.6 L/min-5.8 L/min] 4.6  L/min CI:  [2.1 L/min/m2-2.7 L/min/m2] 2.1 L/min/m2 VENTILATOR SETTINGS: Vent Mode:  [-] PRVC FiO2 (%):  [40 %] 40 % Set Rate:  [14 bmp] 14 bmp Vt Set:  [600 mL] 600 mL PEEP:  [5 cmH20] 5 cmH20 Plateau Pressure:  [18 cmH20-21 cmH20] 19 cmH20 INTAKE / OUTPUT:  Intake/Output Summary (Last 24 hours) at 04/26/2014 0941 Last data filed at 04/12/2014 0900  Gross per 24 hour  Intake 2804.05 ml  Output   4905 ml  Net -2100.95 ml   PHYSICAL EXAMINATION: General:  Sedated on vent, rass -3 HEENT: jvd down PULM;  Ant ronchi, reduced bases CV: s1 s2 rrr distant AB: BS+, soft, nontender Ext: warm, improved edema Neuro: sedated, rass -3  LABS:  CBC  Recent Labs Lab 04/04/14 1600 04/04/14 1937 04/21/2014 0347  WBC 15.9* 13.0* 12.8*  HGB 11.3* 10.3* 10.3*  HCT 32.0* 30.3* 29.7*  PLT 111* 93* 87*   Coag's  Recent Labs Lab 03/25/2014 0142  04/03/14 0347 04/04/14 0415 04/06/2014 0347  APTT 138*  < > 93* 132* 128*  INR 1.18  --   --   --   --   < > = values in this interval not displayed. BMET  Recent Labs Lab 04/04/14 0415 04/04/14 1914 04/07/2014 0347  NA 135 134* 135  K 3.6 3.3* 3.7  CL 111 105 105  CO2 21 22 23   BUN 24* 30* 26*  CREATININE 0.89 0.90 0.89  GLUCOSE 223* 203* 145*   Electrolytes  Recent Labs Lab 04/02/14 0333  04/04/14 0415 04/04/14  1914 04/11/2014 0347  CALCIUM 7.2*  < > 7.1* 7.5* 7.5*  MG 1.6  --   --   --   --   PHOS 1.8*  --   --  1.7*  --   < > = values in this interval not displayed. ABG  Recent Labs Lab 04/03/14 0416 04/04/14 0430 04/19/2014 0354  PHART 7.427 7.434 7.466*  PCO2ART 27.7* 33.3* 33.6*  PO2ART 142.0* 83.9 84.5   Glucose  Recent Labs Lab 04/04/14 1140 04/04/14 1608 04/04/14 1937 04/08/2014 0001 04/04/2014 0346 04/02/2014 0733  GLUCAP 176* 199* 208* 153* 151* 135*   ASSESSMENT / PLAN:  PULMONARY OETT 1/31>>> A: Acute respiratory failure due to cardiogenic shock> CXR 2/1 remarkably normal and improving P:   Continue  full vent support 40%, peep 5, rate 18, 600, abg reviewed, ensure at 8 cc/kf, rate 12 No wean until cleared by chf service, impella etc, likley this afternoon impella out then consider PS 10-12 attempt with WUA Lasix per cards, successful  CARDIOVASCULAR R arterial sheath and swan 1/31>>> L Impella 1/31>>> A:  Cardiogenic shock post STEMI Cath 1/31: severe multivessel disease with occluded LAD and Circumflex with severe disease in RCA and OM1.  S/p 2 vessel PCI of early mid LAD and mid circumflex. S/p Impella insertion.  Severe ischemic cardiomyopathy OFF levophed 2/4 P:  Impella/milrinone likely to wean impella off today, then milirone redcution Tele pcxr with clearing of edema  RENAL A:  Hyponatremia AKI--improving Mild hyperchloremia P:   kvo agree Lasix has likley helped him fget back to top starling curve Chem in am  GASTROINTESTINAL A:  Congestive hepatopathy, concern for gastric stress, constipation P:   tube feedings on going Protonix to bid lft in am  Add mirlixax, colace   HEMATOLOGIC A:  Thrombocytopenia, Likely hemolysis P:  Monitor Hb, s/p cath On Heparin gtt, Aggrastat, and Brilinta per cardiology ldh about same, indirect bili, retic re asuring To dc impella likley today anyway Haptoglobin noted  INFECTIOUS A:  Fever/Leukocytosis post MI 1/31, started on Vanc empirically per pharmacy notes, no blood culture sent P:    2/1 blood cx >> 1/31 vanc >>2/3 Remain off abx  ENDOCRINE A:  Hyperglycemia controlled Likely diabetic  P:   Ssi Maintain lantus  NEUROLOGIC A:  Sedated on mechanical ventilation, following commands P:   RASS goal: -2 Fent and versed prn Once impella out have full WUA   FAMILY  - Updates: family updated bedside daily  - Inter-disciplinary family meet or Palliative Care meeting due by:  day 7  Ccm time 30 min   [name]. [name], MD, FACP Pgr: [phone] [facility]

## 2014-04-05 NOTE — Progress Notes (Signed)
Advanced Heart Failure Rounding Note   Subjective:    Awake on vent.   Remains on Impella. Flow 2.9. On milrinone 0.5. Levophed weaned off this am Got 2units RBCs yesterday and 40mg IV lasix. Excellent urine output. Weight down 4 pounds.  Episode of SVT today. I performed carotid massage and broke to sinus.  Swan numbers done personally at bedside.  RA 12 PA  29/18 (22) PCWP 17 Thermo CO/CI  5.1/2.4 SVR 995  Bedside echo shows Impella in good position. 3.6  cm   Objective:   Weight Range:  Vital Signs:   Temp:  [98.4 F (36.9 C)-99.3 F (37.4 C)] 99 F (37.2 C) (02/04 1100) Pulse Rate:  [106-117] 115 (02/04 1200) Resp:  [15-24] 18 (02/04 1200) BP: (87-113)/(63-86) 87/69 mmHg (02/04 1200) SpO2:  [92 %-99 %] 98 % (02/04 1200) FiO2 (%):  [40 %] 40 % (02/04 0753) Weight:  [103.7 kg (228 lb 9.9 oz)] 103.7 kg (228 lb 9.9 oz) (02/04 0600) Last BM Date:  (PTA)  Weight change: Filed Weights   04/03/14 0500 04/04/14 0500 04/14/2014 0600  Weight: 106.3 kg (234 lb 5.6 oz) 105.4 kg (232 lb 5.8 oz) 103.7 kg (228 lb 9.9 oz)    Intake/Output:   Intake/Output Summary (Last 24 hours) at 04/27/2014 1209 Last data filed at 04/14/2014 1200  Gross per 24 hour  Intake 3000.23 ml  Output   5055 ml  Net -2054.77 ml     Physical Exam: General:  Intubated. Awakens to voice HEENT: normal except for R eye enucleation  NGT with bloody drainage Neck: supple.Carotids 2+ bilat; no bruits.  Cor: PMI laterally displaced. Tachy regular +s3 Lungs: clear Abdomen: soft, nontender, nondistended. No hepatosplenomegaly. No bruits or masses. Hypoactive bowel sounds. Extremities: no cyanosis, clubbing, rash, trace- 1+ edema. R groin Swan and impella ok Neuro: awake on vent  Telemetry: Sinus tach 110-120s  Labs: Basic Metabolic Panel:  Recent Labs Lab 04/02/14 0333 04/03/14 0347 04/04/14 0415 04/04/14 1914 04/28/2014 0347  NA 134* 134* 135 134* 135  K 4.3 3.7 3.6 3.3* 3.7  CL 112 110 111 105  105  CO2 19 20 21 22 23  GLUCOSE 233* 225* 223* 203* 145*  BUN 11 13 24* 30* 26*  CREATININE 1.18 0.99 0.89 0.90 0.89  CALCIUM 7.2* 7.4* 7.1* 7.5* 7.5*  MG 1.6  --   --   --   --   PHOS 1.8*  --   --  1.7*  --     Liver Function Tests:  Recent Labs Lab 04/02/14 0333 04/04/14 0415 04/04/14 1146 04/04/14 1914 04/25/2014 0347  AST 435* 117*  --   --  95*  ALT 133* 100*  --   --  80*  ALKPHOS 102 112  --   --  138*  BILITOT 2.9* 1.7* 1.8*  --  2.3*  PROT 5.0* 4.7*  --   --  5.1*  ALBUMIN 2.0* 1.8*  --  2.0* 1.9*   No results for input(s): LIPASE, AMYLASE in the last 168 hours. No results for input(s): AMMONIA in the last 168 hours.  CBC:  Recent Labs Lab 03/20/2014 0142  04/02/14 0333  04/03/14 0347 04/04/14 0415 04/04/14 1600 04/04/14 1937 04/04/2014 0347  WBC 15.4*  < > 19.8*  < > 16.7* 14.5* 15.9* 13.0* 12.8*  NEUTROABS 12.2*  --  15.1*  --  12.3* 10.7*  --   --  9.2*  HGB 15.0  < > 11.6*  < > 10.4* 9.0*   11.3* 10.3* 10.3*  HCT 43.0  < > 33.7*  < > 30.3* 26.7* 32.0* 30.3* 29.7*  MCV 87.4  < > 88.5  < > 85.1 86.1 86.3 86.3 86.1  PLT 279  < > 242  < > 164 123* 111* 93* 87*  < > = values in this interval not displayed.  Cardiac Enzymes:  Recent Labs Lab 03/05/2014 1207 03/11/2014 1843 04/02/14 0333  TROPONINI >80.00* >81.00* >80.00*    BNP: BNP (last 3 results) No results for input(s): PROBNP in the last 8760 hours.   Other results:    Imaging: Dg Chest Port 1 View  04/04/2014   CLINICAL DATA:  Cardiogenic shock.  EXAM: PORTABLE CHEST - 1 VIEW  COMPARISON:  04/03/2014.  FINDINGS: Endotracheal tube and NG tube in stable position. Swan-Ganz catheter stable position. Swan-Ganz catheter is straightened. Stable cardiomegaly. Low lung volumes with mild basilar and left upper lobe atelectasis. No pleural effusion or pneumothorax.  IMPRESSION: 1. Lines and tubes in stable position. Femoral Swan-Ganz catheter stable position. The catheter has straightened. 2. Stable  cardiomegaly .  No pulmonary venous congestion. 3. Low lung volumes with persistent bibasilar and left upper lobe atelectasis.   Electronically Signed   By: Thomas  Register   On: 04/04/2014 07:15   Dg Abd Portable 1v  04/04/2014   CLINICAL DATA:  58-year-old male with 1 day history of abdominal pain and distension  EXAM: PORTABLE ABDOMEN - 1 VIEW  COMPARISON:  Chest x-ray obtained earlier today  FINDINGS: A nasogastric tube is present. The tip projects over the gastric antrum. The bowel gas pattern is not obstructed. Small volume of gas is noted overlying the rectum in the pelvis. No massive free air on these supine radiographs. No organomegaly. No acute osseous abnormality.  IMPRESSION: 1. No evidence of obstruction. 2. Gastric tube in good position.   Electronically Signed   By: Heath  McCullough M.D.   On: 04/04/2014 21:08     Medications:     Scheduled Medications: . antiseptic oral rinse  7 mL Mouth Rinse QID  . aspirin  81 mg Per Tube Daily  . atorvastatin  80 mg Per Tube q1800  . chlorhexidine  15 mL Mouth Rinse BID  . digoxin  0.25 mg Intravenous Daily  . docusate  100 mg Oral BID  . feeding supplement (PRO-STAT SUGAR FREE 64)  60 mL Per Tube QID  . feeding supplement (VITAL HIGH PROTEIN)  1,000 mL Per Tube Q24H  . impella catheter heparin 50 unit/mL  25,000 Units Intracatheter Q24H  . insulin aspart  0-15 Units Subcutaneous 6 times per day  . insulin glargine  35 Units Subcutaneous Daily  . multivitamin  5 mL Per Tube Daily  . pantoprazole (PROTONIX) IV  40 mg Intravenous Q12H  . polyethylene glycol  17 g Oral BID  . sodium chloride  3 mL Intravenous Q12H  . ticagrelor  90 mg Per Tube BID    Infusions: . sodium chloride 1,000 mL (03/31/2014 0700)  . fentaNYL infusion INTRAVENOUS 25 mcg/hr (04/02/2014 0806)  . heparin 1,700 Units/hr (04/07/2014 0700)  . midazolam (VERSED) infusion 1 mg/hr (04/17/2014 0848)  . milrinone 0.5 mcg/kg/min (04/07/2014 1051)  . norepinephrine (LEVOPHED)  Adult infusion Stopped (04/09/2014 0645)    PRN Medications: Place/Maintain arterial line **AND** sodium chloride, sodium chloride, sodium chloride, acetaminophen, fentaNYL, Influenza vac split quadrivalent PF, midazolam, ondansetron (ZOFRAN) IV, pneumococcal 23 valent vaccine, sodium chloride   Assessment:   1. Cardiogenic shock    --s/p   Impella placement 1/31 2. Acute on chronic systolic HF    --EF 10% on echo 3. Anterolateral STEMI 1/31   --due to stent occlusion   --s/p PCI with DES to LAD and OM-2 4. VDRF 5. CAD 6. Tobacco abuse ongoing 7. 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 Hypomagnesemia/hypnatremia 9. Shock liver 10. Anemia   Plan/Discussion:     He continues to improve with Impella and inotrope support. Now off levophed. On bedside echo this am which I did personally - Impella well positioned and LVEF responding well to support. Renal function currently stable. Oxygenation is good. Pulsatility on Impella much improved suggesting improving LV output. I turned Impella down at bedside to P-5 and then P-4 and SBP maintained about 90. CO about 5.5L at P-4 on Swan. Will continue to wean Impella and likely remove later today. Continue milrinone. Will likely need low-dose levophed for support as well.  Continue Brillinta. Will hold heparin for Impella wean. Repeat echo after Impella out. Apex appears akinetic so may benefit from several months of coumadin to prevent LV thrombus formation.  The patient is critically ill with multiple organ systems failure and requires high complexity decision making for assessment and support, frequent evaluation and titration of therapies, application of advanced monitoring technologies and extensive interpretation of multiple databases.   Critical Care Time devoted to patient care services described in this note is 45 Minutes.  Length of Stay: 4   Daniel Bensimhon MD 04/24/2014, 12:09 PM  Advanced Heart Failure  Team Pager [phone] (M-F; 7a - 4p)  Please contact [facility] for night-coverage after hours (4p -7a ) and weekends on [email or web address]   

## 2014-04-05 NOTE — Progress Notes (Signed)
Per Dr. Prescott GumBensimhon's order, restart heparin and change patient to P-5 in anticipation of cath.  Will continue to monitor.

## 2014-04-05 NOTE — Progress Notes (Addendum)
Replaced the lines infusing Heparin, Milirione, Levophed, Versed and Fentanyl. Versed 30 ml infusion wasted down the  sink as witnessed by Sealed Air CorporationFrancis RN. Fentanyl 60 ml infusion wasted down sink and was witnessed by Beazer HomesFrances RN .

## 2014-04-06 ENCOUNTER — Inpatient Hospital Stay (HOSPITAL_COMMUNITY): Payer: Medicare Other

## 2014-04-06 ENCOUNTER — Encounter (HOSPITAL_COMMUNITY): Payer: Self-pay | Admitting: Cardiovascular Disease

## 2014-04-06 DIAGNOSIS — Z9861 Coronary angioplasty status: Secondary | ICD-10-CM

## 2014-04-06 DIAGNOSIS — I2510 Atherosclerotic heart disease of native coronary artery without angina pectoris: Secondary | ICD-10-CM

## 2014-04-06 LAB — BLOOD GAS, ARTERIAL
ACID-BASE DEFICIT: 0.4 mmol/L (ref 0.0–2.0)
Bicarbonate: 22.7 mEq/L (ref 20.0–24.0)
FIO2: 0.4 %
O2 SAT: 97.9 %
PATIENT TEMPERATURE: 99.1
PEEP: 5 cmH2O
PH ART: 7.48 — AB (ref 7.350–7.450)
PO2 ART: 92.8 mmHg (ref 80.0–100.0)
RATE: 12 resp/min
TCO2: 23.6 mmol/L (ref 0–100)
VT: 600 mL
pCO2 arterial: 31 mmHg — ABNORMAL LOW (ref 35.0–45.0)

## 2014-04-06 LAB — CBC WITH DIFFERENTIAL/PLATELET
BASOS PCT: 0 % (ref 0–1)
Basophils Absolute: 0.1 10*3/uL (ref 0.0–0.1)
EOS ABS: 0.2 10*3/uL (ref 0.0–0.7)
EOS PCT: 2 % (ref 0–5)
HCT: 28.1 % — ABNORMAL LOW (ref 39.0–52.0)
Hemoglobin: 9.5 g/dL — ABNORMAL LOW (ref 13.0–17.0)
LYMPHS PCT: 18 % (ref 12–46)
Lymphs Abs: 2.2 10*3/uL (ref 0.7–4.0)
MCH: 29.6 pg (ref 26.0–34.0)
MCHC: 33.8 g/dL (ref 30.0–36.0)
MCV: 87.5 fL (ref 78.0–100.0)
Monocytes Absolute: 1.3 10*3/uL — ABNORMAL HIGH (ref 0.1–1.0)
Monocytes Relative: 10 % (ref 3–12)
Neutro Abs: 8.7 10*3/uL — ABNORMAL HIGH (ref 1.7–7.7)
Neutrophils Relative %: 70 % (ref 43–77)
Platelets: 74 10*3/uL — ABNORMAL LOW (ref 150–400)
RBC: 3.21 MIL/uL — ABNORMAL LOW (ref 4.22–5.81)
RDW: 14.6 % (ref 11.5–15.5)
WBC: 12.5 10*3/uL — AB (ref 4.0–10.5)

## 2014-04-06 LAB — CBC
HEMATOCRIT: 27.6 % — AB (ref 39.0–52.0)
HEMOGLOBIN: 9.4 g/dL — AB (ref 13.0–17.0)
MCH: 29.9 pg (ref 26.0–34.0)
MCHC: 34.1 g/dL (ref 30.0–36.0)
MCV: 87.9 fL (ref 78.0–100.0)
Platelets: 79 10*3/uL — ABNORMAL LOW (ref 150–400)
RBC: 3.14 MIL/uL — AB (ref 4.22–5.81)
RDW: 15 % (ref 11.5–15.5)
WBC: 10.8 10*3/uL — ABNORMAL HIGH (ref 4.0–10.5)

## 2014-04-06 LAB — COMPREHENSIVE METABOLIC PANEL
ALT: 70 U/L — AB (ref 0–53)
AST: 79 U/L — AB (ref 0–37)
Albumin: 1.8 g/dL — ABNORMAL LOW (ref 3.5–5.2)
Alkaline Phosphatase: 143 U/L — ABNORMAL HIGH (ref 39–117)
BILIRUBIN TOTAL: 1.8 mg/dL — AB (ref 0.3–1.2)
BUN: 26 mg/dL — AB (ref 6–23)
CO2: 27 mmol/L (ref 19–32)
CREATININE: 0.86 mg/dL (ref 0.50–1.35)
Calcium: 7.4 mg/dL — ABNORMAL LOW (ref 8.4–10.5)
Chloride: 106 mmol/L (ref 96–112)
GFR calc Af Amer: >90 mL/min (ref >90)
GFR calc non Af Amer: >90 mL/min (ref >90)
Glucose, Bld: 150 mg/dL — ABNORMAL HIGH (ref 70–99)
Potassium: 3.5 mmol/L (ref 3.5–5.1)
Sodium: 133 mmol/L — ABNORMAL LOW (ref 135–145)
Total Protein: 5 g/dL — ABNORMAL LOW (ref 6.0–8.3)

## 2014-04-06 LAB — POCT ACTIVATED CLOTTING TIME
ACTIVATED CLOTTING TIME: 165 s
ACTIVATED CLOTTING TIME: 183 s
ACTIVATED CLOTTING TIME: 196 s
ACTIVATED CLOTTING TIME: 171 s
ACTIVATED CLOTTING TIME: 153 s
Activated Clotting Time: 165 seconds
Activated Clotting Time: 177 seconds

## 2014-04-06 LAB — GLUCOSE, CAPILLARY
GLUCOSE-CAPILLARY: 111 mg/dL — AB (ref 70–99)
GLUCOSE-CAPILLARY: 116 mg/dL — AB (ref 70–99)
GLUCOSE-CAPILLARY: 130 mg/dL — AB (ref 70–99)
Glucose-Capillary: 147 mg/dL — ABNORMAL HIGH (ref 70–99)
Glucose-Capillary: 117 mg/dL — ABNORMAL HIGH (ref 70–99)

## 2014-04-06 LAB — MAGNESIUM: MAGNESIUM: 2 mg/dL (ref 1.5–2.5)

## 2014-04-06 LAB — DIGOXIN LEVEL: DIGOXIN LVL: 0.9 ng/mL (ref 0.8–2.0)

## 2014-04-06 LAB — LACTATE DEHYDROGENASE: LDH: 1031 U/L — ABNORMAL HIGH (ref 94–250)

## 2014-04-06 LAB — GASTRIC OCCULT BLOOD (1-CARD TO LAB): Occult Blood, Gastric: POSITIVE — AB

## 2014-04-06 LAB — APTT: aPTT: 126 seconds — ABNORMAL HIGH (ref 24–37)

## 2014-04-06 MED ORDER — AMIODARONE LOAD VIA INFUSION
150.0000 mg | Freq: Once | INTRAVENOUS | Status: AC
  Administered 2014-04-06: 150 mg via INTRAVENOUS

## 2014-04-06 MED ORDER — FUROSEMIDE 10 MG/ML IJ SOLN
20.0000 mg | Freq: Once | INTRAMUSCULAR | Status: AC
  Administered 2014-04-06: 20 mg via INTRAVENOUS
  Filled 2014-04-06: qty 2

## 2014-04-06 MED ORDER — FUROSEMIDE 10 MG/ML IJ SOLN
20.0000 mg | Freq: Two times a day (BID) | INTRAMUSCULAR | Status: DC
  Filled 2014-04-06 (×2): qty 2

## 2014-04-06 MED ORDER — BISACODYL 10 MG RE SUPP
10.0000 mg | Freq: Every day | RECTAL | Status: DC | PRN
  Administered 2014-04-12 – 2014-04-26 (×3): 10 mg via RECTAL
  Filled 2014-04-06 (×3): qty 1

## 2014-04-06 MED ORDER — AMIODARONE IV BOLUS ONLY 150 MG/100ML: 150.0000 mg | Freq: Once | INTRAVENOUS | Status: DC

## 2014-04-06 MED ORDER — SENNOSIDES 8.8 MG/5ML PO SYRP
5.0000 mL | ORAL_SOLUTION | Freq: Every day | ORAL | Status: DC | PRN
  Filled 2014-04-06 (×3): qty 5

## 2014-04-06 MED ORDER — AMIODARONE LOAD VIA INFUSION: 150.0000 mg | Freq: Once | INTRAVENOUS | Status: DC

## 2014-04-06 MED ORDER — HEPARIN SODIUM (PORCINE) 5000 UNIT/ML IJ SOLN
5000.0000 [IU] | Freq: Three times a day (TID) | INTRAMUSCULAR | Status: DC
  Administered 2014-04-06: 5000 [IU] via SUBCUTANEOUS
  Filled 2014-04-06: qty 1

## 2014-04-06 NOTE — Progress Notes (Signed)
eLink Physician-Brief Progress Note Patient Name: Job FoundsFrederick Lyster DOB: 02/03/1956 MRN: [id number]   Date of Service  04/06/2014  HPI/Events of Note  Heme positive gastric contents.  On subq heparin and ticagrelor.  On BID protonix.  eICU Interventions  Plan: D/C subq heparin Continue ticagrelor per cardiology Continue to monitor H/H Continue BID protonix     Intervention Category Intermediate Interventions: Bleeding - evaluation and treatment with blood products  DETERDING,ELIZABETH 04/06/2014, 11:47 PM

## 2014-04-06 NOTE — Progress Notes (Signed)
Advanced Heart Failure Rounding Note   Subjective:    Awake on vent.   Remains on Impella. Flow 2.9. On milrinone 0.5. Levophed weaned off. Underwent PCI RCA yesterday.   Started on amio for recurrent episode of SVT.  Ernestine Conrad numbers done personally at bedside.  RA 12 PA  29/16 (22) PCWP 16 Thermo CO/CI  5.4/2.5 SVR 907  Bedside echo shows Impella in good position. 3.5 cm   Objective:   Weight Range:  Vital Signs:   Temp:  [98.6 F (37 C)-99.3 F (37.4 C)] 99.1 F (37.3 C) (02/05 0800) Pulse Rate:  [93-127] 97 (02/05 0800) Resp:  [15-24] 17 (02/05 0800) BP: (87-100)/(61-71) 97/68 mmHg (02/05 0800) SpO2:  [87 %-99 %] 98 % (02/05 0800) FiO2 (%):  [40 %] 40 % (02/05 0728) Weight:  [106.5 kg (234 lb 12.6 oz)] 106.5 kg (234 lb 12.6 oz) (02/05 0344) Last BM Date:  (PTA)  Weight change: Filed Weights   04/04/14 0500 April 25, 2014 0600 04/06/14 0344  Weight: 105.4 kg (232 lb 5.8 oz) 103.7 kg (228 lb 9.9 oz) 106.5 kg (234 lb 12.6 oz)    Intake/Output:   Intake/Output Summary (Last 24 hours) at 04/06/14 0823 Last data filed at 04/06/14 0800  Gross per 24 hour  Intake 2163.33 ml  Output   2510 ml  Net -346.67 ml     Physical Exam: General:  Intubated. Awakens to voice HEENT: normal except for R eye enucleation  NGT with bloody drainage Neck: supple.Carotids 2+ bilat; no bruits.  Cor: PMI laterally displaced. Tachy regular +s3 Lungs: clear Abdomen: soft, nontender, nondistended. No hepatosplenomegaly. No bruits or masses. Hypoactive bowel sounds. Extremities: no cyanosis, clubbing, rash, trace- 1+ edema. R groin Swan and impella ok Neuro: awake on vent  Telemetry: Sinus tach 110-120s bursts of SVT   Labs: Basic Metabolic Panel:  Recent Labs Lab 04/02/14 0333 04/03/14 0347 04/04/14 0415 04/04/14 1914 04-25-2014 0347 04/06/14 0305  NA 134* 134* 135 134* 135 133*  K 4.3 3.7 3.6 3.3* 3.7 3.5  CL 112 110 111 105 105 106  CO2 GLUCOSE 233* 225*  223* 203* 145* 150*  BUN 11 13 24* 30* 26* 26*  CREATININE 1.18 0.99 0.89 0.90 0.89 0.86  CALCIUM 7.2* 7.4* 7.1* 7.5* 7.5* 7.4*  MG 1.6  --   --   --   --  2.0  PHOS 1.8*  --   --  1.7*  --   --     Liver Function Tests:  Recent Labs Lab 04/02/14 0333 04/04/14 0415 04/04/14 1146 04/04/14 1914 2014-04-25 0347 04/06/14 0305  AST 435* 117*  --   --  95* 79*  ALT 133* 100*  --   --  80* 70*  ALKPHOS 102 112  --   --  138* 143*  BILITOT 2.9* 1.7* 1.8*  --  2.3* 1.8*  PROT 5.0* 4.7*  --   --  5.1* 5.0*  ALBUMIN 2.0* 1.8*  --  2.0* 1.9* 1.8*   No results for input(s): LIPASE, AMYLASE in the last 168 hours. No results for input(s): AMMONIA in the last 168 hours.  CBC:  Recent Labs Lab 04/02/14 0333  04/03/14 0347 04/04/14 0415 04/04/14 1600 04/04/14 1937 25-Apr-2014 0347 04/06/14 0305  WBC 19.8*  < > 16.7* 14.5* 15.9* 13.0* 12.8* 12.5*  NEUTROABS 15.1*  --  12.3* 10.7*  --   --  9.2* 8.7*  HGB 11.6*  < > 10.4* 9.0* 11.3* 10.3*  10.3* 9.5*  HCT 33.7*  < > 30.3* 26.7* 32.0* 30.3* 29.7* 28.1*  MCV 88.5  < > 85.1 86.1 86.3 86.3 86.1 87.5  PLT 242  < > 164 123* 111* 93* 87* 74*  < > = values in this interval not displayed.  Cardiac Enzymes:  Recent Labs Lab 03/27/2014 1207 03/29/2014 1843 04/02/14 0333  TROPONINI >80.00* >81.00* >80.00*    BNP: BNP (last 3 results) No results for input(s): PROBNP in the last 8760 hours.   Other results:    Imaging: Dg Chest Port 1 View  04/06/2014   CLINICAL DATA:  Pulmonary edema.  Cardiogenic shock.  EXAM: PORTABLE CHEST - 1 VIEW  COMPARISON:  Two-view chest 04/04/2014  FINDINGS: The endotracheal tube is in stable position, 3.7 cm above the carina. An NG tube courses off the inferior border of the film. Defibrillator pads remain in place. The heart is mildly enlarged. Mild pulmonary vascular congestion is noted. No focal airspace consolidation. The visualized soft tissues and bony thorax are unremarkable.  IMPRESSION: 1. Cardiomegaly  with slight increase in mild pulmonary vascular congestion. 2. Stable and satisfactory positioning of the endotracheal tube.   Electronically Signed   By: Gennette Pac M.D.   On: 04/06/2014 07:18   Dg Abd Portable 1v  04/04/2014   CLINICAL DATA:  59 year old male with 1 day history of abdominal pain and distension  EXAM: PORTABLE ABDOMEN - 1 VIEW  COMPARISON:  Chest x-ray obtained earlier today  FINDINGS: A nasogastric tube is present. The tip projects over the gastric antrum. The bowel gas pattern is not obstructed. Small volume of gas is noted overlying the rectum in the pelvis. No massive free air on these supine radiographs. No organomegaly. No acute osseous abnormality.  IMPRESSION: 1. No evidence of obstruction. 2. Gastric tube in good position.   Electronically Signed   By: Malachy Moan M.D.   On: 04/04/2014 21:08     Medications:     Scheduled Medications: . antiseptic oral rinse  7 mL Mouth Rinse QID  . aspirin  81 mg Per Tube Daily  . atorvastatin  80 mg Per Tube q1800  . chlorhexidine  15 mL Mouth Rinse BID  . digoxin  0.25 mg Intravenous Daily  . docusate  100 mg Oral BID  . feeding supplement (PRO-STAT SUGAR FREE 64)  60 mL Per Tube QID  . feeding supplement (VITAL HIGH PROTEIN)  1,000 mL Per Tube Q24H  . impella catheter heparin 50 unit/mL  25,000 Units Intracatheter Q24H  . insulin aspart  0-15 Units Subcutaneous 6 times per day  . insulin glargine  35 Units Subcutaneous Daily  . multivitamin  5 mL Per Tube Daily  . pantoprazole (PROTONIX) IV  40 mg Intravenous Q12H  . sodium chloride  3 mL Intravenous Q12H  . ticagrelor  90 mg Per Tube BID    Infusions: . sodium chloride 1,000 mL (03/30/2014 0700)  . amiodarone 30 mg/hr (04/06/14 0700)  . fentaNYL infusion INTRAVENOUS 50 mcg/hr (04/06/14 0700)  . heparin 1,690 Units/hr (04/06/14 0806)  . midazolam (VERSED) infusion 2 mg/hr (04/06/14 0700)  . milrinone 0.5 mcg/kg/min (04/06/14 0700)  . norepinephrine (LEVOPHED)  Adult infusion Stopped (04/06/14 0747)    PRN Medications: Place/Maintain arterial line **AND** sodium chloride, sodium chloride, sodium chloride, acetaminophen, fentaNYL, Influenza vac split quadrivalent PF, midazolam, ondansetron (ZOFRAN) IV, pneumococcal 23 valent vaccine, sodium chloride   Assessment:   1. Cardiogenic shock    --s/p Impella placement 1/31 2. Acute on  chronic systolic HF    --EF 10% on echo 3. Anterolateral STEMI 1/31   --due to stent occlusion   --s/p PCI with DES to LAD and OM-2 on 1/31   --s/p PCI with DES to RCA 2/5 4. VDRF 5. CAD 6. Tobacco abuse ongoing 7. 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 Hypomagnesemia/hypnatremia 9. Shock liver 10. Anemia s/p 3u RBCs 11. SVT   Plan/Discussion:     He continues to improve with Impella and inotrope support. Now off levophed. On bedside echo this am which I did personally - Impella well positioned and LVEF responding well to support. Renal function currently stable. Oxygenation is good. Pulsatility on Impella much improved suggesting improving LV output. He did well with Impella wean yesterday but not pulled due to PCI of RCA. Will plan to remove Impella today. Given one dose IV lasix not to reduce risk of acute pulmonary edema with pulling pump. Continue milrinone. May need low-dose levophed for support as well.  Continue Brillinta. Will hold heparin for Impella wean. Repeat echo after Impella out. Apex appears akinetic so may benefit from several months of coumadin to prevent LV thrombus formation. Continue amiodarone for SVT.   The patient is critically ill with multiple organ systems failure and requires high complexity decision making for assessment and support, frequent evaluation and titration of therapies, application of advanced monitoring technologies and extensive interpretation of multiple databases.   Critical Care Time devoted to patient care services described in this note is 45  Minutes.  Length of Stay: 5   Arvilla Meresaniel Darcey Demma MD 04/06/2014, 8:23 AM  Advanced Heart Failure Team Pager [phone] (M-F; 7a - 4p)  Please contact [facility] for night-coverage after hours (4p -7a ) and weekends on [email or web address]

## 2014-04-06 NOTE — Progress Notes (Signed)
eLink Physician-Brief Progress Note Patient Name: Micheal Ayala DOB: 04/07/1955 MRN: [id number]   Date of Service  04/06/2014  HPI/Events of Note  TF started today but nurse reporting 350 cc of black residual.  Hbg has dropped brom 10.3 on 2/4 to 9.5 on 2/5  eICU Interventions  Plan: Check for occult blood Stat CBC Consider hold of TF and further intervention once above data back.        DETERDING,ELIZABETH 04/06/2014, 9:42 PM

## 2014-04-06 NOTE — Progress Notes (Signed)
Patient's impella site began oozing 2 hours after heparin started. Pressure held for 10 minutes and new dressing applied and reinforced. ACT was 165 at target range at this time. Bleeding has stopped for now. Pharmacy made aware, and Dr. Onalee HuaAlvarez made aware, no new orders at this time.   Patient also had another tachycardic episode in the 160's-170's for greater than 2 minutes before breaking to sinus. Notified Dr. Onalee HuaAlvarez with cardiology. New orders for 150 mg amiodarone bolus should tachycardia reoccur. Patient became tachycardic shortly after, 150 mg amiodarone bolus given. Will continue to monitor closely.

## 2014-04-06 NOTE — Progress Notes (Signed)
   04/06/14 1745  Adult Ventilator Settings  Pressure Support 12 cmH20  PEEP 5 cmH20  Adult Ventilator Measurements  Exhaled Vt 618 mL  Placed patient on pressure support 12/5 per MD.  Patient was pulling volumes in the 600s with a RR of 20, but got very aggitated so i had to return him to full support and request RN assistance.  RN is titrating sedation at this time.  Will try again later.

## 2014-04-06 NOTE — Progress Notes (Addendum)
  Impella removed and manual pressure personally held on LFA for 45 minutes. Good hemostasis. Fem stop applied.   Hemodynamics stable on levophed 10 and milrinone 0.5. Family updated at bedside.   Will wean inotropes slowly. Can begin vent wean.  Will start lasix 20 IV bid.  Additional critical care time 70 minutes.  Trinda Harlacher,MD 1:03 PM

## 2014-04-06 NOTE — Progress Notes (Signed)
PULMONARY / CRITICAL CARE MEDICINE   Name: Micheal Ayala MRN: [id number] DOB: 04-10-1955    ADMISSION DATE:  03/10/2014 CONSULTATION DATE:  03/04/2014  REFERRING MD :  Dr. Herbie Baltimore  CHIEF COMPLAINT:  STEMI  INITIAL PRESENTATION: 24M smoker male visiting [place] from [place] with HTN, CAD s/p STEMI 2002, presenting to ED via EMS as code STEMI with anterior and inferior STEMI. Initially hemodynically stable in the field, then severe respiratory distress on arrival to ED and hypotensive. Started on pressors. Taken emergently to cath lab for STEMI and cardiogenic shock. PCCM consulted for respiratory distress and vent management.   STUDIES:  1/31 Cath and PCI: Severe multivessel disease with occluded LAD and Codominant Circumflex with severe disease in the RCA and OM1.  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  Cardiac function currently being supported by 3.5 mL Impella.  Successful 2 vessel PCI of the early mid LAD and mid circumflex into OM 2 with Synergy DES stents. 1/31 Echo > LVEF 20-25%, severely reduced systolic function 2/4 LHC > DES RCA 2/5 TTE (bedside, Bensimohn) > "LVEF responding well to support", impella in place   SIGNIFICANT EVENTS: 1/31: Chest pain x2 hours, called EMS, code STEMI, hypotensive and resp distress on arrival to ED, taken emergently to cath lab, 2 vessel PCI of LAD and Circumflex, Impella insertion, on pressors, remains intubated.  2/3- neg 2 liters, improved CI   SUBJECTIVE: LHC yesterday, DES to RCA  VITAL SIGNS: Temp:  [98.6 F (37 C)-99.3 F (37.4 C)] 99.1 F (37.3 C) (02/05 0800) Pulse Rate:  [48-127] 48 (02/05 0900) Resp:  [15-24] 19 (02/05 0900) BP: (87-101)/(61-71) 101/67 mmHg (02/05 0900) SpO2:  [87 %-99 %] 96 % (02/05 0900) FiO2 (%):  [40 %] 40 % (02/05 0728) Weight:  [106.5 kg (234 lb 12.6 oz)] 106.5 kg (234 lb 12.6 oz) (02/05 0344) HEMODYNAMICS: PAP: (25-34)/(15-23)  29/16 mmHg CVP:  [12 mmHg-18 mmHg] 12 mmHg PCWP:  [16 mmHg] 16 mmHg CO:  [5.1 L/min-5.4 L/min] 5.4 L/min CI:  [2.4 L/min/m2-2.5 L/min/m2] 2.5 L/min/m2 VENTILATOR SETTINGS: Vent Mode:  [-] PRVC FiO2 (%):  [40 %] 40 % Set Rate:  [12 bmp-14 bmp] 12 bmp Vt Set:  [600 mL] 600 mL PEEP:  [5 cmH20] 5 cmH20 Plateau Pressure:  [8 cmH20-26 cmH20] 8 cmH20 INTAKE / OUTPUT:  Intake/Output Summary (Last 24 hours) at 04/06/14 0933 Last data filed at 04/06/14 0900  Gross per 24 hour  Intake 2207.44 ml  Output   2745 ml  Net -537.56 ml   PHYSICAL EXAMINATION:  Gen: sedated on vent HEENT: NCAT ETT in place PULM: wheeze bilaterally CV: RRR, S1S2 AB: BS+, soft, nontender Ext: warm Neuro: opens eyes to voice  LABS:  CBC  Recent Labs Lab 04/04/14 1937 04/17/2014 0347 04/06/14 0305  WBC 13.0* 12.8* 12.5*  HGB 10.3* 10.3* 9.5*  HCT 30.3* 29.7* 28.1*  PLT 93* 87* 74*   Coag's  Recent Labs Lab 03/28/2014 0142  04/04/14 0415 04/27/2014 0347 04/06/14 0305  APTT 138*  < > 132* 128* 126*  INR 1.18  --   --   --   --   < > = values in this interval not displayed. BMET  Recent Labs Lab 04/04/14 1914 04/21/2014 0347 04/06/14 0305  NA 134* 135 133*  K 3.3* 3.7 3.5  CL 105 105 106  CO2 BUN 30* 26* 26*  CREATININE 0.90 0.89 0.86  GLUCOSE 203*  145* 150*   Electrolytes  Recent Labs Lab 04/02/14 0333  04/04/14 1914 04/04/2014 0347 04/06/14 0305  CALCIUM 7.2*  < > 7.5* 7.5* 7.4*  MG 1.6  --   --   --  2.0  PHOS 1.8*  --  1.7*  --   --   < > = values in this interval not displayed. ABG  Recent Labs Lab 04/04/14 0430 04/15/2014 0354 04/06/14 0500  PHART 7.434 7.466* 7.480*  PCO2ART 33.3* 33.6* 31.0*  PO2ART 83.9 84.5 92.8   Glucose  Recent Labs Lab 04/04/2014 1110 04/10/2014 1827 04/16/2014 1940 04/06/14 04/06/14 0307 04/06/14 0741  GLUCAP 137* 131* 128* 130* 147* 111*   ASSESSMENT / PLAN:  PULMONARY OETT 1/31>>> A: Acute respiratory failure due to  cardiogenic shock and pulmonary edema 2/5 CXR > pulm edema P:   Continue full vent support No wean until cleared by chf service, impella etc WUA  CARDIOVASCULAR L Impella 1/31>>> Introducer 1/31 L femoral >> Introducer 1/31 R femoral >> A:  Cardiogenic shock post STEMI > improving Cath 1/31: severe multivessel disease , PCI LAD Cath 2/4: PCI RCA P:  Impella/milrinone/pressors per cardiology Tele Diuresis per cardiology  RENAL A:  AKI resolved P:   Monitor BMET and UOP Replace electrolytes as needed  GASTROINTESTINAL A:  Protein calorie malnutrition Constipation P:   Continue BID protonix Continue TF Add senna/dulcolax  HEMATOLOGIC A:  Thrombocytopenia, Likely hemolysis Anti-platelets/anti-coagulants per cardiology P:  Monitor Hb  INFECTIOUS A:  Fever/Leukocytosis post MI 1/31, started on Vanc empirically per pharmacy notes, no blood culture sent P:    2/1 blood cx >> 1/31 vanc >>2/3  Monitor off Abx for now  ENDOCRINE A:  Hyperglycemia controlled Likely diabetic  P:   SSI Maintain lantus  NEUROLOGIC A:  Sedated on mechanical ventilation, following commands P:   RASS goal: -2 Fent and versed prn Once impella out have full WUA   FAMILY  - Updates: family updated bedside daily as of 2/4  - Inter-disciplinary family meet or Palliative Care meeting due by:  day 7  Ccm time 35 min   Heber CarolinaBrent Lynnzie Blackson, MD [facility] Pager: [phone] Cell: [phone] If no response, call [phone]

## 2014-04-07 DIAGNOSIS — I509 Heart failure, unspecified: Secondary | ICD-10-CM

## 2014-04-07 DIAGNOSIS — I213 ST elevation (STEMI) myocardial infarction of unspecified site: Secondary | ICD-10-CM

## 2014-04-07 DIAGNOSIS — J9600 Acute respiratory failure, unspecified whether with hypoxia or hypercapnia: Secondary | ICD-10-CM

## 2014-04-07 LAB — BLOOD GAS, ARTERIAL
Acid-Base Excess: 1.3 mmol/L (ref 0.0–2.0)
BICARBONATE: 25.4 meq/L — AB (ref 20.0–24.0)
DRAWN BY: 331761
FIO2: 0.4 %
LHR: 12 {breaths}/min
MECHVT: 610 mL
O2 Saturation: 99.1 %
PCO2 ART: 40.3 mmHg (ref 35.0–45.0)
PEEP: 5 cmH2O
Patient temperature: 98.6
TCO2: 26.6 mmol/L (ref 0–100)
pH, Arterial: 7.416 (ref 7.350–7.450)
pO2, Arterial: 183 mmHg — ABNORMAL HIGH (ref 80.0–100.0)

## 2014-04-07 LAB — CBC WITH DIFFERENTIAL/PLATELET
BASOS PCT: 0 % (ref 0–1)
Basophils Absolute: 0 10*3/uL (ref 0.0–0.1)
EOS PCT: 2 % (ref 0–5)
Eosinophils Absolute: 0.2 10*3/uL (ref 0.0–0.7)
HCT: 29.3 % — ABNORMAL LOW (ref 39.0–52.0)
Hemoglobin: 9.7 g/dL — ABNORMAL LOW (ref 13.0–17.0)
LYMPHS PCT: 15 % (ref 12–46)
Lymphs Abs: 1.6 10*3/uL (ref 0.7–4.0)
MCH: 29.8 pg (ref 26.0–34.0)
MCHC: 33.1 g/dL (ref 30.0–36.0)
MCV: 90.2 fL (ref 78.0–100.0)
MONO ABS: 1.5 10*3/uL — AB (ref 0.1–1.0)
Monocytes Relative: 13 % — ABNORMAL HIGH (ref 3–12)
NEUTROS PCT: 70 % (ref 43–77)
Neutro Abs: 7.8 10*3/uL — ABNORMAL HIGH (ref 1.7–7.7)
Platelets: 86 10*3/uL — ABNORMAL LOW (ref 150–400)
RBC: 3.25 MIL/uL — AB (ref 4.22–5.81)
RDW: 15 % (ref 11.5–15.5)
WBC: 11.2 10*3/uL — AB (ref 4.0–10.5)

## 2014-04-07 LAB — GLUCOSE, CAPILLARY
GLUCOSE-CAPILLARY: 102 mg/dL — AB (ref 70–99)
GLUCOSE-CAPILLARY: 128 mg/dL — AB (ref 70–99)
Glucose-Capillary: 103 mg/dL — ABNORMAL HIGH (ref 70–99)
Glucose-Capillary: 108 mg/dL — ABNORMAL HIGH (ref 70–99)
Glucose-Capillary: 119 mg/dL — ABNORMAL HIGH (ref 70–99)
Glucose-Capillary: 126 mg/dL — ABNORMAL HIGH (ref 70–99)
Glucose-Capillary: 136 mg/dL — ABNORMAL HIGH (ref 70–99)

## 2014-04-07 LAB — BASIC METABOLIC PANEL
ANION GAP: 7 (ref 5–15)
BUN: 30 mg/dL — ABNORMAL HIGH (ref 6–23)
CHLORIDE: 104 mmol/L (ref 96–112)
CO2: 24 mmol/L (ref 19–32)
Calcium: 7.4 mg/dL — ABNORMAL LOW (ref 8.4–10.5)
Creatinine, Ser: 0.86 mg/dL (ref 0.50–1.35)
GFR calc Af Amer: >90 mL/min (ref >90)
Glucose, Bld: 124 mg/dL — ABNORMAL HIGH (ref 70–99)
Potassium: 3.4 mmol/L — ABNORMAL LOW (ref 3.5–5.1)
Sodium: 135 mmol/L (ref 135–145)

## 2014-04-07 LAB — APTT: APTT: 29 s (ref 24–37)

## 2014-04-07 LAB — LACTATE DEHYDROGENASE: LDH: 958 U/L — AB (ref 94–250)

## 2014-04-07 MED ORDER — POTASSIUM CHLORIDE 10 MEQ/50ML IV SOLN
10.0000 meq | INTRAVENOUS | Status: AC
  Administered 2014-04-07 (×4): 10 meq via INTRAVENOUS
  Filled 2014-04-07: qty 50

## 2014-04-07 MED ORDER — FUROSEMIDE 10 MG/ML IJ SOLN
40.0000 mg | Freq: Two times a day (BID) | INTRAMUSCULAR | Status: DC
  Administered 2014-04-07 – 2014-04-09 (×6): 40 mg via INTRAVENOUS
  Filled 2014-04-07 (×10): qty 4

## 2014-04-07 MED ORDER — PERFLUTREN LIPID MICROSPHERE
INTRAVENOUS | Status: AC
  Administered 2014-04-07: 5 mL
  Filled 2014-04-07: qty 10

## 2014-04-07 MED ORDER — POTASSIUM CHLORIDE 10 MEQ/50ML IV SOLN
10.0000 meq | INTRAVENOUS | Status: AC
  Administered 2014-04-07 (×4): 10 meq via INTRAVENOUS
  Filled 2014-04-07 (×4): qty 50

## 2014-04-07 MED ORDER — PERFLUTREN LIPID MICROSPHERE
1.0000 mL | INTRAVENOUS | Status: DC | PRN
  Filled 2014-04-07: qty 10

## 2014-04-07 MED ORDER — POTASSIUM CHLORIDE 10 MEQ/50ML IV SOLN: 10.0000 meq | INTRAVENOUS | Status: DC

## 2014-04-07 NOTE — Progress Notes (Signed)
Echocardiogram 2D Echocardiogram has been performed.  Dorothey BasemanReel, Erryn Dickison M 04/07/2014, 9:10 AM

## 2014-04-07 NOTE — Progress Notes (Signed)
Potassium 3.4- notified Dr Jon BillingsSivak w/ order of IV potassium.

## 2014-04-07 NOTE — Progress Notes (Signed)
PULMONARY / CRITICAL CARE MEDICINE   Name: Job FoundsFrederick Pastorino MRN: [id number] DOB: 05/01/1955    ADMISSION DATE:  03/24/2014 CONSULTATION DATE:  03/07/2014  REFERRING MD :  Dr. Herbie BaltimoreHarding  CHIEF COMPLAINT:  STEMI  INITIAL PRESENTATION: 67M smoker male visiting [place] from [place] with HTN, CAD s/p STEMI 2002, presenting to ED via EMS as code STEMI with anterior and inferior STEMI. Initially hemodynically stable in the field, then severe respiratory distress on arrival to ED and hypotensive. Started on pressors. Taken emergently to cath lab for STEMI and cardiogenic shock. PCCM consulted for respiratory distress and vent management.   STUDIES:  1/31 Cath and PCI: Severe multivessel disease with occluded LAD and Codominant Circumflex with severe disease in the RCA and OM1.  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  Cardiac function currently being supported by 3.5 mL Impella.  Successful 2 vessel PCI of the early mid LAD and mid circumflex into OM 2 with Synergy DES stents. 1/31 Echo > LVEF 20-25%, severely reduced systolic function 2/4 LHC > DES RCA 2/5 TTE (bedside, Bensimohn) > "LVEF responding well to support", impella in place   SIGNIFICANT EVENTS: 1/31: Chest pain x2 hours, called EMS, code STEMI, hypotensive and resp distress on arrival to ED, taken emergently to cath lab, 2 vessel PCI of LAD and Circumflex, Impella insertion, on pressors, remains intubated.  2/3- neg 2 liters, improved CI   SUBJECTIVE:  Impella removed , off  Pressors  Remains on milrinone /amio   VITAL SIGNS: Temp:  [98.6 F (37 C)-99.5 F (37.5 C)] 98.8 F (37.1 C) (02/06 1100) Pulse Rate:  [86-143] 89 (02/06 1100) Resp:  [12-17] 12 (02/06 1100) BP: (97-110)/(59-64) 97/60 mmHg (02/06 0750) SpO2:  [89 %-98 %] 97 % (02/06 1100) FiO2 (%):  [40 %] 40 % (02/06 0750) Weight:  [105.1 kg (231 lb 11.3 oz)] 105.1 kg (231 lb 11.3 oz) (02/06  0352) HEMODYNAMICS: PAP: (32-43)/(22-30) 36/25 mmHg CVP:  [11 mmHg-17 mmHg] 12 mmHg PCWP:  [22 mmHg] 22 mmHg CO:  [4.4 L/min-5.3 L/min] 5.3 L/min CI:  [2 L/min/m2-2.4 L/min/m2] 2.4 L/min/m2 VENTILATOR SETTINGS: Vent Mode:  [-] PRVC FiO2 (%):  [40 %] 40 % Set Rate:  [12 bmp] 12 bmp Vt Set:  [600 mL] 600 mL PEEP:  [5 cmH20] 5 cmH20 Pressure Support:  [12 cmH20] 12 cmH20 Plateau Pressure:  [17 cmH20-22 cmH20] 17 cmH20 INTAKE / OUTPUT:  Intake/Output Summary (Last 24 hours) at 04/07/14 1139 Last data filed at 04/07/14 1112  Gross per 24 hour  Intake 2074.09 ml  Output   3047 ml  Net -972.91 ml   PHYSICAL EXAMINATION:  Gen: sedated on vent HEENT: NCAT ETT in place PULM: decreased BS , no wheeze  CV: RRR, S1S2 AB: BS+, soft, nontender Ext: warm Neuro: unresponsive   LABS:  CBC  Recent Labs Lab 04/06/14 0305 04/06/14 2155 04/07/14 0344  WBC 12.5* 10.8* 11.2*  HGB 9.5* 9.4* 9.7*  HCT 28.1* 27.6* 29.3*  PLT 74* 79* 86*   Coag's  Recent Labs Lab 03/28/2014 0142  04/06/2014 0347 04/06/14 0305 04/07/14 0344  APTT 138*  < > 128* 126* 29  INR 1.18  --   --   --   --   < > = values in this interval not displayed. BMET  Recent Labs Lab 04/07/2014 0347 04/06/14 0305 04/07/14 0344  NA 135 133* 135  K 3.7 3.5 3.4*  CL 105 106 104  CO2  BUN 26* 26* 30*  CREATININE 0.89 0.86 0.86  GLUCOSE 145* 150* 124*   Electrolytes  Recent Labs Lab 04/02/14 0333  04/04/14 1914 04-16-2014 0347 04/06/14 0305 04/07/14 0344  CALCIUM 7.2*  < > 7.5* 7.5* 7.4* 7.4*  MG 1.6  --   --   --  2.0  --   PHOS 1.8*  --  1.7*  --   --   --   < > = values in this interval not displayed. ABG  Recent Labs Lab Apr 16, 2014 0354 04/06/14 0500 04/07/14 0330  PHART 7.466* 7.480* 7.416  PCO2ART 33.6* 31.0* 40.3  PO2ART 84.5 92.8 183.0*   Glucose  Recent Labs Lab 04/06/14 0741 04/06/14 1134 04/06/14 1642 04/06/14 2031 04/07/14 0024 04/07/14 0333  GLUCAP 111* 117* 116*  128* 136* 119*   ASSESSMENT / PLAN:  PULMONARY OETT 1/31>>> A: Acute respiratory failure due to cardiogenic shock and pulmonary edema 2/5 CXR > pulm edema P:   Continue full vent support No wean until cleared by chf service WUA Check cxr in am   CARDIOVASCULAR L Impella 1/31>>>2/5 Introducer 1/31 L femoral >> Introducer 1/31 R femoral >> A:  Cardiogenic shock post STEMI > improving Cath 1/31: severe multivessel disease , PCI LAD Cath 2/4: PCI RCA P:  Amio/miilrinone/ per cardiology Diuresis per cardiology  RENAL A:  AKI resolved P:   Monitor BMET and UOP Replace electrolytes as needed  GASTROINTESTINAL A:  Protein calorie malnutrition Constipation Heme positive gastric contents ?gastritis /off hep  P:   Continue BID protonix Continue TF  senna/dulcolax Off hep -  HEMATOLOGIC A:  Thrombocytopenia, Likely hemolysis Anti-platelets/anti-coagulants per cardiology Heme positive gastric contents>hbg stable   P:  Monitor Hb closely  Off hep 2/5   INFECTIOUS A:  Fever/Leukocytosis post MI 1/31, started on Vanc empirically per pharmacy notes, no blood culture sent P:    2/1 blood cx >> 1/31 vanc >>2/3  Monitor off Abx for now  ENDOCRINE A:  Hyperglycemia controlled Likely diabetic  P:   SSI Maintain lantus  NEUROLOGIC A:  Sedated on mechanical ventilation, P:   RASS goal: 0-to -1  Fent and versed prn  WUA    FAMILY  - Updates: family updated bedside daily as of 2/6  - Inter-disciplinary family meet or Palliative Care meeting due by:  day 7    Aamani Moose NP-C   Pulmonary and Critical Care  [phone]

## 2014-04-07 NOTE — Progress Notes (Addendum)
Patient ID: Micheal Ayala, male   DOB: 1955-09-03, 59 y.o.   MRN: [id number] Advanced Heart Failure Rounding Note   Subjective:    Opens eyes, does not follow commands.  Impella removed 2/5.  Off norepinephrine 2/5.   Stable this morning.  No significant overnight events.  He is on amiodarone for recurrent episode of SVT.  Off heparin with coffee grounds in OGT.  Hemoglobin stable.   Swan:  RA 15 PA  36/27 PCWP 22 Thermo CO/CI  2.4   Objective:   Weight Range:  Vital Signs:   Temp:  [98.6 F (37 C)-99.5 F (37.5 C)] 98.6 F (37 C) (02/06 0700) Pulse Rate:  [48-143] 97 (02/06 0700) Resp:  [13-19] 15 (02/06 0700) BP: (94-110)/(59-68) 97/59 mmHg (02/06 0320) SpO2:  [89 %-98 %] 97 % (02/06 0700) FiO2 (%):  [40 %] 40 % (02/06 0320) Weight:  [231 lb 11.3 oz (105.1 kg)] 231 lb 11.3 oz (105.1 kg) (02/06 0352) Last BM Date:  (PTA)  Weight change: Filed Weights   2014-04-10 0600 04/06/14 0344 04/07/14 0352  Weight: 228 lb 9.9 oz (103.7 kg) 234 lb 12.6 oz (106.5 kg) 231 lb 11.3 oz (105.1 kg)    Intake/Output:   Intake/Output Summary (Last 24 hours) at 04/07/14 0730 Last data filed at 04/07/14 0700  Gross per 24 hour  Intake 2014.25 ml  Output   3182 ml  Net -1167.75 ml     Physical Exam: General:  Intubated. 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  JVP elevated.  Cor: PMI laterally displaced. regular +s3 Lungs: clear Abdomen: soft, nontender, nondistended. No hepatosplenomegaly. No bruits or masses. Hypoactive bowel sounds. Extremities: no cyanosis, clubbing, rash, 1+ ankle edema. R groin Swan ok Neuro: awake on vent  Telemetry: NSR  Labs: Basic Metabolic Panel:  Recent Labs Lab 04/02/14 0333  04/04/14 0415 04/04/14 1914 04-10-14 0347 04/06/14 0305 04/07/14 0344  NA 134*  < > 135 134* 135 133* 135  K 4.3  < > 3.6 3.3* 3.7 3.5 3.4*  CL 112  < > 111 105 105 106 104   CO2 19  < > GLUCOSE 233*  < > 223* 203* 145* 150* 124*  BUN 11  < > 24* 30* 26* 26* 30*  CREATININE 1.18  < > 0.89 0.90 0.89 0.86 0.86  CALCIUM 7.2*  < > 7.1* 7.5* 7.5* 7.4* 7.4*  MG 1.6  --   --   --   --  2.0  --   PHOS 1.8*  --   --  1.7*  --   --   --   < > = values in this interval not displayed.  Liver Function Tests:  Recent Labs Lab 04/02/14 0333 04/04/14 0415 04/04/14 1146 04/04/14 1914 2014/04/10 0347 04/06/14 0305  AST 435* 117*  --   --  95* 79*  ALT 133* 100*  --   --  80* 70*  ALKPHOS 102 112  --   --  138* 143*  BILITOT 2.9* 1.7* 1.8*  --  2.3* 1.8*  PROT 5.0* 4.7*  --   --  5.1* 5.0*  ALBUMIN 2.0* 1.8*  --  2.0* 1.9* 1.8*   No results for input(s): LIPASE, AMYLASE in the last 168 hours. No results for input(s): AMMONIA in the last 168 hours.  CBC:  Recent Labs Lab 04/03/14 0347 04/04/14 0415  04/04/14 1937  04/09/2014 0347 04/06/14 0305 04/06/14 2155 04/07/14 0344  WBC 16.7* 14.5*  < > 13.0* 12.8* 12.5* 10.8* 11.2*  NEUTROABS 12.3* 10.7*  --   --  9.2* 8.7*  --  7.8*  HGB 10.4* 9.0*  < > 10.3* 10.3* 9.5* 9.4* 9.7*  HCT 30.3* 26.7*  < > 30.3* 29.7* 28.1* 27.6* 29.3*  MCV 85.1 86.1  < > 86.3 86.1 87.5 87.9 90.2  PLT 164 123*  < > 93* 87* 74* 79* 86*  < > = values in this interval not displayed.  Cardiac Enzymes:  Recent Labs Lab 03/22/2014 1207 03/15/2014 1843 04/02/14 0333  TROPONINI >80.00* >81.00* >80.00*    BNP: BNP (last 3 results) No results for input(s): PROBNP in the last 8760 hours.   Other results:    Imaging: Dg Chest Port 1 View  04/06/2014   CLINICAL DATA:  Pulmonary edema.  Cardiogenic shock.  EXAM: PORTABLE CHEST - 1 VIEW  COMPARISON:  Two-view chest 04/04/2014  FINDINGS: The endotracheal tube is in stable position, 3.7 cm above the carina. An NG tube courses off the inferior border of the film. Defibrillator pads remain in place. The heart is mildly enlarged. Mild pulmonary vascular congestion is noted. No  focal airspace consolidation. The visualized soft tissues and bony thorax are unremarkable.  IMPRESSION: 1. Cardiomegaly with slight increase in mild pulmonary vascular congestion. 2. Stable and satisfactory positioning of the endotracheal tube.   Electronically Signed   By: Gennette Pachris  Mattern M.D.   On: 04/06/2014 07:18     Medications:     Scheduled Medications: . antiseptic oral rinse  7 mL Mouth Rinse QID  . aspirin  81 mg Per Tube Daily  . atorvastatin  80 mg Per Tube q1800  . chlorhexidine  15 mL Mouth Rinse BID  . digoxin  0.25 mg Intravenous Daily  . docusate  100 mg Oral BID  . feeding supplement (PRO-STAT SUGAR FREE 64)  60 mL Per Tube QID  . feeding supplement (VITAL HIGH PROTEIN)  1,000 mL Per Tube Q24H  . furosemide  40 mg Intravenous BID  . insulin aspart  0-15 Units Subcutaneous 6 times per day  . insulin glargine  35 Units Subcutaneous Daily  . multivitamin  5 mL Per Tube Daily  . pantoprazole (PROTONIX) IV  40 mg Intravenous Q12H  . potassium chloride  10 mEq Intravenous Q1 Hr x 4  . potassium chloride  10 mEq Intravenous Q1 Hr x 4  . sodium chloride  3 mL Intravenous Q12H  . ticagrelor  90 mg Per Tube BID    Infusions: . sodium chloride 1,000 mL (03/06/2014 0700)  . amiodarone 30 mg/hr (04/06/14 1629)  . fentaNYL infusion INTRAVENOUS 100 mcg/hr (04/06/14 1810)  . midazolam (VERSED) infusion 2 mg/hr (04/07/14 0437)  . milrinone 0.5 mcg/kg/min (04/07/14 0439)  . norepinephrine (LEVOPHED) Adult infusion Stopped (04/07/14 0400)    PRN Medications: Place/Maintain arterial line **AND** sodium chloride, sodium chloride, sodium chloride, acetaminophen, bisacodyl, fentaNYL, Influenza vac split quadrivalent PF, midazolam, ondansetron (ZOFRAN) IV, pneumococcal 23 valent vaccine, sennosides, sodium chloride   Assessment:   1. Cardiogenic shock    --s/p Impella placement 1/31, Impella out 2/5.  2. Acute on chronic systolic HF    --EF 10% on echo 3. Anterolateral STEMI  1/31   --due to stent occlusion   --s/p PCI with DES to LAD and OM-2 on 1/31   --s/p PCI with DES to RCA 2/5 4. VDRF 5. CAD 6. Tobacco abuse ongoing 7.  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 Hypomagnesemia/hypnatremia 9. Shock liver 10. Anemia s/p 3u RBCs 11. SVT: Amiodarone started   Plan/Discussion:    He is doing well currently off norepinephrine and with Impella removed.  He continues on milrinone 0.5 and digoxin.  Cardiac output is acceptable this morning but filling pressures are rising.  - Increase Lasix to 40 mg IV bid and replete K. - Vent weaning per CCM.   Continue Brilinta, ASA, statin for CAD.  Repeat echo now that Impella is out.  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   Ongoing coffee grounds in OGT. Hemoglobin stable.  Off heparin, continue Protonix IV.   Continue amiodarone for SVT.   The patient is critically ill with multiple organ systems failure and requires high complexity decision making for assessment and support, frequent evaluation and titration of therapies, application of advanced monitoring technologies and extensive interpretation of multiple databases.   Critical Care Time devoted to patient care services described in this note is 45 Minutes.  Length of Stay: 6   Marca Ancona MD 04/07/2014, 7:30 AM  Advanced Heart Failure Team Pager [phone] (M-F; 7a - 4p)  Please contact [facility] for night-coverage after hours (4p -7a ) and weekends on [email or web address]

## 2014-04-08 ENCOUNTER — Inpatient Hospital Stay (HOSPITAL_COMMUNITY): Payer: Medicare Other

## 2014-04-08 LAB — CBC WITH DIFFERENTIAL/PLATELET
BASOS ABS: 0 10*3/uL (ref 0.0–0.1)
Basophils Relative: 0 % (ref 0–1)
Eosinophils Absolute: 0.2 10*3/uL (ref 0.0–0.7)
Eosinophils Relative: 2 % (ref 0–5)
HEMATOCRIT: 28.5 % — AB (ref 39.0–52.0)
Hemoglobin: 9.3 g/dL — ABNORMAL LOW (ref 13.0–17.0)
LYMPHS PCT: 14 % (ref 12–46)
Lymphs Abs: 1.6 10*3/uL (ref 0.7–4.0)
MCH: 29.2 pg (ref 26.0–34.0)
MCHC: 32.6 g/dL (ref 30.0–36.0)
MCV: 89.3 fL (ref 78.0–100.0)
Monocytes Absolute: 1.2 10*3/uL — ABNORMAL HIGH (ref 0.1–1.0)
Monocytes Relative: 10 % (ref 3–12)
NEUTROS ABS: 8.2 10*3/uL — AB (ref 1.7–7.7)
NEUTROS PCT: 73 % (ref 43–77)
PLATELETS: 108 10*3/uL — AB (ref 150–400)
RBC: 3.19 MIL/uL — ABNORMAL LOW (ref 4.22–5.81)
RDW: 15.1 % (ref 11.5–15.5)
WBC: 11.2 10*3/uL — ABNORMAL HIGH (ref 4.0–10.5)

## 2014-04-08 LAB — CULTURE, BLOOD (ROUTINE X 2)
CULTURE: NO GROWTH
Culture: NO GROWTH

## 2014-04-08 LAB — CARBOXYHEMOGLOBIN
CARBOXYHEMOGLOBIN: 1.1 % (ref 0.5–1.5)
Carboxyhemoglobin: 0.9 % (ref 0.5–1.5)
METHEMOGLOBIN: 0.8 % (ref 0.0–1.5)
METHEMOGLOBIN: 1 % (ref 0.0–1.5)
O2 Saturation: 60.5 %
O2 Saturation: 83.9 %
TOTAL HEMOGLOBIN: 9.4 g/dL — AB (ref 13.5–18.0)
Total hemoglobin: 9.6 g/dL — ABNORMAL LOW (ref 13.5–18.0)

## 2014-04-08 LAB — GLUCOSE, CAPILLARY
GLUCOSE-CAPILLARY: 120 mg/dL — AB (ref 70–99)
Glucose-Capillary: 106 mg/dL — ABNORMAL HIGH (ref 70–99)
Glucose-Capillary: 128 mg/dL — ABNORMAL HIGH (ref 70–99)
Glucose-Capillary: 128 mg/dL — ABNORMAL HIGH (ref 70–99)
Glucose-Capillary: 129 mg/dL — ABNORMAL HIGH (ref 70–99)
Glucose-Capillary: 140 mg/dL — ABNORMAL HIGH (ref 70–99)

## 2014-04-08 LAB — BASIC METABOLIC PANEL
Anion gap: 7 (ref 5–15)
BUN: 37 mg/dL — ABNORMAL HIGH (ref 6–23)
CHLORIDE: 105 mmol/L (ref 96–112)
CO2: 26 mmol/L (ref 19–32)
Calcium: 7.7 mg/dL — ABNORMAL LOW (ref 8.4–10.5)
Creatinine, Ser: 0.93 mg/dL (ref 0.50–1.35)
GFR calc Af Amer: >90 mL/min (ref >90)
GFR calc non Af Amer: >90 mL/min (ref >90)
GLUCOSE: 115 mg/dL — AB (ref 70–99)
Potassium: 3.7 mmol/L (ref 3.5–5.1)
Sodium: 138 mmol/L (ref 135–145)

## 2014-04-08 LAB — MAGNESIUM: MAGNESIUM: 2.4 mg/dL (ref 1.5–2.5)

## 2014-04-08 LAB — PHOSPHORUS: Phosphorus: 4.2 mg/dL (ref 2.3–4.6)

## 2014-04-08 MED ORDER — SPIRONOLACTONE 12.5 MG HALF TABLET
12.5000 mg | ORAL_TABLET | Freq: Every day | ORAL | Status: DC
  Administered 2014-04-08 – 2014-04-11 (×4): 12.5 mg via ORAL
  Filled 2014-04-08 (×5): qty 1

## 2014-04-08 MED ORDER — GUAIFENESIN 100 MG/5ML PO SOLN
10.0000 mL | Freq: Four times a day (QID) | ORAL | Status: DC
  Administered 2014-04-08 – 2014-04-12 (×15): 200 mg
  Filled 2014-04-08 (×20): qty 10

## 2014-04-08 MED ORDER — HEPARIN SODIUM (PORCINE) 5000 UNIT/ML IJ SOLN
5000.0000 [IU] | Freq: Three times a day (TID) | INTRAMUSCULAR | Status: DC
  Administered 2014-04-08 – 2014-04-21 (×38): 5000 [IU] via SUBCUTANEOUS
  Filled 2014-04-08 (×50): qty 1

## 2014-04-08 MED ORDER — POTASSIUM CHLORIDE 10 MEQ/50ML IV SOLN
10.0000 meq | INTRAVENOUS | Status: AC
  Administered 2014-04-08 (×4): 10 meq via INTRAVENOUS
  Filled 2014-04-08: qty 50

## 2014-04-08 NOTE — Progress Notes (Signed)
PULMONARY / CRITICAL CARE MEDICINE

## 2014-04-08 NOTE — Progress Notes (Signed)
Patient ID: Micheal Ayala, male   DOB: [birth year], 59 y.o.   MRN: [id number] Advanced Heart Failure Rounding Note   Subjective:    Opens eyes and moving extremities, not yet following commands.  Impella removed 2/5.  Off norepinephrine 2/5. Good diuresis yesterday with IV Lasix.   Stable this morning.  No significant overnight events.  He is on amiodarone for recurrent episode of SVT, no further SVT.  Coffee grounds from OGT have resolved. Hemoglobin stable.   Echo (2/7) with EF 25-30%, peri-apical akinesis, no LV thrombus noted, normal RV.   Swan:  RA 16 PA  40/23 PCWP 23 Thermo CO/CI  2.5 Co-ox 60%   Objective:   Weight Range:  Vital Signs:   Temp:  [98.4 F (36.9 C)-99.3 F (37.4 C)] 98.6 F (37 C) (02/07 0811) Pulse Rate:  [63-103] 103 (02/07 0811) Resp:  [11-15] 12 (02/07 0811) BP: (92-106)/(55-60) 105/58 mmHg (02/07 0811) SpO2:  [94 %-98 %] 97 % (02/07 0811) FiO2 (%):  [40 %-50 %] 40 % (02/07 0811) Weight:  [226 lb 13.7 oz (102.9 kg)] 226 lb 13.7 oz (102.9 kg) (02/07 0415) Last BM Date:  (PTA)  Weight change: Filed Weights   04/06/14 0344 04/07/14 0352 04/08/14 0415  Weight: 234 lb 12.6 oz (106.5 kg) 231 lb 11.3 oz (105.1 kg) 226 lb 13.7 oz (102.9 kg)    Intake/Output:   Intake/Output Summary (Last 24 hours) at 04/08/14 0824 Last data filed at 04/08/14 0700  Gross per 24 hour  Intake 2128.1 ml  Output   4075 ml  Net -1946.9 ml     Physical Exam: General:  Intubated. 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  JVP elevated.  Cor: PMI laterally displaced. regular +s3 Lungs: clear Abdomen: soft, nontender, nondistended. No hepatosplenomegaly. No bruits or masses. Hypoactive bowel sounds. Extremities: no cyanosis, clubbing, rash, 1+ ankle edema. R groin Swan ok Neuro: awake on vent  Telemetry: NSR  Labs: Basic Metabolic Panel:  Recent Labs Lab 04/02/14 0333   04/04/14 1914 04/22/2014 0347 04/06/14 0305 04/07/14 0344 04/08/14 0440  NA 134*  < > 134* 135 133* 135 138  K 4.3  < > 3.3* 3.7 3.5 3.4* 3.7  CL 112  < > 105 105 106 104 105  CO2 19  < > GLUCOSE 233*  < > 203* 145* 150* 124* 115*  BUN 11  < > 30* 26* 26* 30* 37*  CREATININE 1.18  < > 0.90 0.89 0.86 0.86 0.93  CALCIUM 7.2*  < > 7.5* 7.5* 7.4* 7.4* 7.7*  MG 1.6  --   --   --  2.0  --  2.4  PHOS 1.8*  --  1.7*  --   --   --  4.2  < > = values in this interval not displayed.  Liver Function Tests:  Recent Labs Lab 04/02/14 0333 04/04/14 0415 04/04/14 1146 04/04/14 1914 2014-04-22 0347 04/06/14 0305  AST 435* 117*  --   --  95* 79*  ALT 133* 100*  --   --  80* 70*  ALKPHOS 102 112  --   --  138* 143*  BILITOT 2.9* 1.7* 1.8*  --  2.3* 1.8*  PROT 5.0* 4.7*  --   --  5.1* 5.0*  ALBUMIN 2.0* 1.8*  --  2.0* 1.9* 1.8*   No results for input(s): LIPASE, AMYLASE in the last 168  hours. No results for input(s): AMMONIA in the last 168 hours.  CBC:  Recent Labs Lab 04/04/14 0415  04/07/2014 0347 04/06/14 0305 04/06/14 2155 04/07/14 0344 04/08/14 0440  WBC 14.5*  < > 12.8* 12.5* 10.8* 11.2* 11.2*  NEUTROABS 10.7*  --  9.2* 8.7*  --  7.8* 8.2*  HGB 9.0*  < > 10.3* 9.5* 9.4* 9.7* 9.3*  HCT 26.7*  < > 29.7* 28.1* 27.6* 29.3* 28.5*  MCV 86.1  < > 86.1 87.5 87.9 90.2 89.3  PLT 123*  < > 87* 74* 79* 86* 108*  < > = values in this interval not displayed.  Cardiac Enzymes:  Recent Labs Lab 03/24/2014 1207 03/17/2014 1843 04/02/14 0333  TROPONINI >80.00* >81.00* >80.00*    BNP: BNP (last 3 results) No results for input(s): PROBNP in the last 8760 hours.   Other results:    Imaging: Dg Chest Port 1 View  04/08/2014   CLINICAL DATA:  Respiratory failure, intubated  EXAM: PORTABLE CHEST - 1 VIEW  COMPARISON:  04/06/2014  FINDINGS: Lungs are essentially clear. No focal consolidation. No pleural effusion or pneumothorax.  Cardiomegaly.  Endotracheal tube terminates  6 cm above the carina.  Inferior approach Swan-Ganz catheter terminates in the right main pulmonary artery. Enteric tube courses into the stomach.  IMPRESSION: Endotracheal tube terminates 6 cm above the carina.  Additional support apparatus as above.   Electronically Signed   By: Charline Bills M.D.   On: 04/08/2014 07:17     Medications:     Scheduled Medications: . antiseptic oral rinse  7 mL Mouth Rinse QID  . aspirin  81 mg Per Tube Daily  . atorvastatin  80 mg Per Tube q1800  . chlorhexidine  15 mL Mouth Rinse BID  . digoxin  0.25 mg Intravenous Daily  . docusate  100 mg Oral BID  . feeding supplement (PRO-STAT SUGAR FREE 64)  60 mL Per Tube QID  . feeding supplement (VITAL HIGH PROTEIN)  1,000 mL Per Tube Q24H  . furosemide  40 mg Intravenous BID  . heparin subcutaneous  5,000 Units Subcutaneous 3 times per day  . insulin aspart  0-15 Units Subcutaneous 6 times per day  . insulin glargine  35 Units Subcutaneous Daily  . multivitamin  5 mL Per Tube Daily  . pantoprazole (PROTONIX) IV  40 mg Intravenous Q12H  . potassium chloride  10 mEq Intravenous Q1 Hr x 4  . sodium chloride  3 mL Intravenous Q12H  . spironolactone  12.5 mg Oral Daily  . ticagrelor  90 mg Per Tube BID    Infusions: . sodium chloride 1,000 mL (03/15/2014 0700)  . amiodarone 30 mg/hr (04/08/14 0515)  . fentaNYL infusion INTRAVENOUS 350 mcg/hr (04/08/14 0804)  . milrinone 0.5 mcg/kg/min (04/08/14 0515)    PRN Medications: Place/Maintain arterial line **AND** sodium chloride, sodium chloride, sodium chloride, acetaminophen, bisacodyl, fentaNYL, Influenza vac split quadrivalent PF, ondansetron (ZOFRAN) IV, pneumococcal 23 valent vaccine, sennosides, sodium chloride   Assessment:   1. Cardiogenic shock    --s/p Impella placement 1/31, Impella out 2/5.  2. Acute on chronic systolic HF    --EF 10% on echo    --Repeat echo (2/7) with EF 25-30%, peri-apical akinesis, no LV thrombus, normal RV 3.  Anterolateral STEMI 1/31   --due to stent occlusion   --s/p PCI with DES to LAD and OM-2 on 1/31   --s/p PCI with DES to RCA 2/5 4. VDRF 5. CAD 6. Tobacco abuse ongoing 7. H/o  traumatic brain injury    --s/p R eye enucleation 8. Hypomagnesemia/hypnatremia 9. Shock liver 10. Anemia s/p 3u RBCs 11. SVT: Amiodarone started   Plan/Discussion:    He is doing well currently off norepinephrine and with Impella removed.  Echo on 2/7 showed some improvement in LV systolic function, EF 25-30%.  He continues on milrinone 0.5 and digoxin.  Cardiac output is acceptable this morning but filling pressures remain elevated.  He diuresed well with IV Lasix yesterday, weight down 5 lbs.  - Continue current Lasix.  - Slow milrinone wean, may start tomorrow if filling pressures look better.  - Digoxin trough with am labs.  - Vent weaning per CCM.   Continue Brilinta, ASA, statin for CAD.  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 He had coffee grounds from OGT.  However, this has resolved and hemoglobin basically stable.  Continue PPI.  Will restart prophylactic heparin today.  If hemoglobin remains stable, can start warfarin.   Continue amiodarone for SVT.   Length of Stay: 7   Marca Anconaalton Kenyatta Keidel MD 04/08/2014, 8:24 AM  Advanced Heart Failure Team Pager [phone] (M-F; 7a - 4p)  Please contact [facility] for night-coverage after hours (4p -7a ) and weekends on [email or web address]

## 2014-04-08 NOTE — Progress Notes (Signed)
PULMONARY / CRITICAL CARE MEDICINE   Name: Micheal Ayala MRN: [id number] DOB: 04-17-1955    ADMISSION DATE:  03/14/2014 CONSULTATION DATE:  03/20/2014  REFERRING MD :  Dr. Herbie [place]  CHIEF COMPLAINT:  STEMI  INITIAL PRESENTATION: 87M smoker male visiting [place] from [place] with HTN, CAD s/p STEMI 2002, presenting to ED via EMS as code STEMI with anterior and inferior STEMI. Initially hemodynically stable in the field, then severe respiratory distress on arrival to ED and hypotensive. Started on pressors. Taken emergently to cath lab for STEMI and cardiogenic shock. PCCM consulted for respiratory distress and vent management.   STUDIES:  1/31 Cath and PCI: Severe multivessel disease with occluded LAD and Codominant Circumflex with severe disease in the RCA and OM1.  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  Cardiac function currently being supported by 3.5 mL Impella.  Successful 2 vessel PCI of the early mid LAD and mid circumflex into OM 2 with Synergy DES stents. 1/31 Echo > LVEF 20-25%, severely reduced systolic function 2/4 LHC > DES RCA 2/5 TTE (bedside, [place]) > "LVEF responding well to support", impella in place   SIGNIFICANT EVENTS: 1/31: Chest pain x2 hours, called EMS, code STEMI, hypotensive and resp distress on arrival to ED, taken emergently to cath lab, 2 vessel PCI of LAD and Circumflex, Impella insertion, on pressors, remains intubated.  2/3- neg 2 liters, improved CI   SUBJECTIVE:   Remains on milrinone /amio  Opened eye and closed eye to command No further coffee ground from OGT, hbg stable  Cont to diuresis well    VITAL SIGNS: Temp:  [98.4 F (36.9 C)-99.3 F (37.4 C)] 98.6 F (37 C) (02/07 1100) Pulse Rate:  [63-103] 93 (02/07 1100) Resp:  [11-15] 12 (02/07 1100) BP: (92-106)/(55-60) 105/58 mmHg (02/07 0811) SpO2:  [94 %-98 %] 95 % (02/07 1100) FiO2 (%):  [40 %-50 %] 40 % (02/07  1100) Weight:  [102.9 kg (226 lb 13.7 oz)] 102.9 kg (226 lb 13.7 oz) (02/07 0415) HEMODYNAMICS: PAP: (34-53)/(18-31) 53/18 mmHg CVP:  [15 mmHg-16 mmHg] 16 mmHg PCWP:  [22 mmHg-23 mmHg] 23 mmHg CO:  [4.4 L/min-5.4 L/min] 5.4 L/min CI:  [2 L/min/m2-2.5 L/min/m2] 2.5 L/min/m2 VENTILATOR SETTINGS: Vent Mode:  [-] PRVC FiO2 (%):  [40 %-50 %] 40 % Set Rate:  [12 bmp] 12 bmp Vt Set:  [600 mL] 600 mL PEEP:  [5 cmH20] 5 cmH20 Plateau Pressure:  [11 cmH20-22 cmH20] 11 cmH20 INTAKE / OUTPUT:  Intake/Output Summary (Last 24 hours) at 04/08/14 1154 Last data filed at 04/08/14 1100  Gross per 24 hour  Intake 2188.7 ml  Output   3525 ml  Net -1336.3 ml   PHYSICAL EXAMINATION:  Gen: sedated on vent HEENT: NCAT ETT in place PULM: decreased BS , no wheeze  CV: RRR, S1S2 AB: BS+, soft, nontender Ext: warm Neuro : follows commands intermittently   LABS:  CBC  Recent Labs Lab 04/06/14 2155 04/07/14 0344 04/08/14 0440  WBC 10.8* 11.2* 11.2*  HGB 9.4* 9.7* 9.3*  HCT 27.6* 29.3* 28.5*  PLT 79* 86* 108*   Coag's  Recent Labs Lab 04-12-14 0347 04/06/14 0305 04/07/14 0344  APTT 128* 126* 29   BMET  Recent Labs Lab 04/06/14 0305 04/07/14 0344 04/08/14 0440  NA 133* 135 138  K 3.5 3.4* 3.7  CL 106 104 105  CO2 BUN 26* 30* 37*  CREATININE 0.86 0.86 0.93  GLUCOSE  150* 124* 115*   Electrolytes  Recent Labs Lab 04/02/14 0333  04/04/14 1914  04/06/14 0305 04/07/14 0344 04/08/14 0440  CALCIUM 7.2*  < > 7.5*  < > 7.4* 7.4* 7.7*  MG 1.6  --   --   --  2.0  --  2.4  PHOS 1.8*  --  1.7*  --   --   --  4.2  < > = values in this interval not displayed. ABG  Recent Labs Lab 04/25/2014 0354 04/06/14 0500 04/07/14 0330  PHART 7.466* 7.480* 7.416  PCO2ART 33.6* 31.0* 40.3  PO2ART 84.5 92.8 183.0*   Glucose  Recent Labs Lab 04/07/14 1234 04/07/14 1703 04/07/14 1930 04/07/14 2343 04/08/14 0338 04/08/14 0802  GLUCAP 103* 126* 108* 102* 128* 106*    ASSESSMENT / PLAN:  PULMONARY OETT 1/31>>> A: Acute respiratory failure due to cardiogenic shock and pulmonary edema  P:   Continue full vent support Assess for weaning as able  WUA Check cxr in am   CARDIOVASCULAR L Impella 1/31>>>2/5 Introducer 1/31 L femoral >> Introducer 1/31 R femoral >> A:  Cardiogenic shock post STEMI > improving Cath 1/31: severe multivessel disease , PCI LAD Cath 2/4: PCI RCA P:  Amio/miilrinone/ per cardiology Diuresis per cardiology  RENAL A:  AKI resolved P:   Monitor BMET and UOP Replace electrolytes as needed  GASTROINTESTINAL A:  Protein calorie malnutrition Constipation Heme positive gastric contents ?gastritis /off hep  P:   Continue BID protonix Continue TF  senna/dulcolax Off hep -  HEMATOLOGIC A:  Thrombocytopenia, Likely hemolysis Anti-platelets/anti-coagulants per cardiology Heme positive gastric contents>hbg stable   P:  Monitor Hb closely  Off hep 2/5   INFECTIOUS A:  Fever/Leukocytosis post MI 1/31, started on Vanc empirically per pharmacy notes, no blood culture sent P:    2/1 blood cx >>neg  1/31 vanc >>2/3  Monitor off Abx for now  ENDOCRINE A:  Hyperglycemia controlled Likely diabetic  P:   SSI Maintain lantus  NEUROLOGIC A:  Sedated on mechanical ventilation, P:   RASS goal: 0-to -1  Fent and versed prn  WUA    FAMILY  - Updates: family updated bedside daily as of 2/6  - Inter-disciplinary family meet or Palliative Care meeting due by:  day 7    Tammy Parrett NP-C  [facility]  [phone]

## 2014-04-08 NOTE — Progress Notes (Addendum)
45 cc versed wasted in the sink witness by Marvel PlanFrances RN.

## 2014-04-09 ENCOUNTER — Inpatient Hospital Stay (HOSPITAL_COMMUNITY): Payer: Medicare Other

## 2014-04-09 DIAGNOSIS — I2102 ST elevation (STEMI) myocardial infarction involving left anterior descending coronary artery: Secondary | ICD-10-CM

## 2014-04-09 DIAGNOSIS — J9601 Acute respiratory failure with hypoxia: Secondary | ICD-10-CM

## 2014-04-09 LAB — COMPREHENSIVE METABOLIC PANEL
ALT: 148 U/L — AB (ref 0–53)
ANION GAP: 6 (ref 5–15)
AST: 100 U/L — ABNORMAL HIGH (ref 0–37)
Albumin: 2 g/dL — ABNORMAL LOW (ref 3.5–5.2)
Alkaline Phosphatase: 288 U/L — ABNORMAL HIGH (ref 39–117)
BUN: 40 mg/dL — AB (ref 6–23)
CALCIUM: 7.5 mg/dL — AB (ref 8.4–10.5)
CO2: 31 mmol/L (ref 19–32)
Chloride: 103 mmol/L (ref 96–112)
Creatinine, Ser: 1.06 mg/dL (ref 0.50–1.35)
GFR calc Af Amer: 88 mL/min — ABNORMAL LOW (ref >90)
GFR calc non Af Amer: 76 mL/min — ABNORMAL LOW (ref >90)
Glucose, Bld: 108 mg/dL — ABNORMAL HIGH (ref 70–99)
Potassium: 3.7 mmol/L (ref 3.5–5.1)
Sodium: 140 mmol/L (ref 135–145)
TOTAL PROTEIN: 6.1 g/dL (ref 6.0–8.3)
Total Bilirubin: 3.1 mg/dL — ABNORMAL HIGH (ref 0.3–1.2)

## 2014-04-09 LAB — CBC WITH DIFFERENTIAL/PLATELET
BASOS PCT: 0 % (ref 0–1)
Basophils Absolute: 0 10*3/uL (ref 0.0–0.1)
Eosinophils Absolute: 0.1 10*3/uL (ref 0.0–0.7)
Eosinophils Relative: 1 % (ref 0–5)
HCT: 30.6 % — ABNORMAL LOW (ref 39.0–52.0)
Hemoglobin: 10 g/dL — ABNORMAL LOW (ref 13.0–17.0)
Lymphocytes Relative: 10 % — ABNORMAL LOW (ref 12–46)
Lymphs Abs: 1.3 10*3/uL (ref 0.7–4.0)
MCH: 29.5 pg (ref 26.0–34.0)
MCHC: 32.7 g/dL (ref 30.0–36.0)
MCV: 90.3 fL (ref 78.0–100.0)
MONOS PCT: 12 % (ref 3–12)
Monocytes Absolute: 1.5 10*3/uL — ABNORMAL HIGH (ref 0.1–1.0)
NEUTROS ABS: 9.9 10*3/uL — AB (ref 1.7–7.7)
Neutrophils Relative %: 77 % (ref 43–77)
PLATELETS: 197 10*3/uL (ref 150–400)
RBC: 3.39 MIL/uL — AB (ref 4.22–5.81)
RDW: 15.3 % (ref 11.5–15.5)
WBC: 12.8 10*3/uL — ABNORMAL HIGH (ref 4.0–10.5)

## 2014-04-09 LAB — GLUCOSE, CAPILLARY: GLUCOSE-CAPILLARY: 113 mg/dL — AB (ref 70–99)

## 2014-04-09 LAB — PHOSPHORUS: PHOSPHORUS: 4.1 mg/dL (ref 2.3–4.6)

## 2014-04-09 LAB — DIGOXIN LEVEL: Digoxin Level: <0.2 ng/mL — ABNORMAL LOW (ref 0.8–2.0)

## 2014-04-09 LAB — MAGNESIUM: Magnesium: 2.5 mg/dL (ref 1.5–2.5)

## 2014-04-09 MED ORDER — POTASSIUM CHLORIDE 20 MEQ/15ML (10%) PO SOLN
40.0000 meq | Freq: Once | ORAL | Status: AC
  Administered 2014-04-09: 40 meq via ORAL
  Filled 2014-04-09 (×2): qty 30

## 2014-04-09 MED ORDER — PIPERACILLIN-TAZOBACTAM 3.375 G IVPB
3.3750 g | Freq: Three times a day (TID) | INTRAVENOUS | Status: DC
  Administered 2014-04-09 – 2014-04-11 (×6): 3.375 g via INTRAVENOUS
  Filled 2014-04-09 (×8): qty 50

## 2014-04-09 MED ORDER — IPRATROPIUM-ALBUTEROL 0.5-2.5 (3) MG/3ML IN SOLN
3.0000 mL | Freq: Four times a day (QID) | RESPIRATORY_TRACT | Status: DC
  Administered 2014-04-09 – 2014-04-10 (×4): 3 mL via RESPIRATORY_TRACT
  Filled 2014-04-09 (×4): qty 3

## 2014-04-09 MED ORDER — VANCOMYCIN HCL 10 G IV SOLR
2000.0000 mg | Freq: Once | INTRAVENOUS | Status: AC
  Administered 2014-04-09: 2000 mg via INTRAVENOUS
  Filled 2014-04-09: qty 2000

## 2014-04-09 MED ORDER — MIDAZOLAM HCL 2 MG/2ML IJ SOLN
2.0000 mg | INTRAMUSCULAR | Status: DC | PRN
  Administered 2014-04-09 (×3): 2 mg via INTRAVENOUS
  Filled 2014-04-09 (×3): qty 2

## 2014-04-09 MED ORDER — VANCOMYCIN HCL IN DEXTROSE 1-5 GM/200ML-% IV SOLN
1000.0000 mg | Freq: Two times a day (BID) | INTRAVENOUS | Status: DC
  Administered 2014-04-09 – 2014-04-12 (×6): 1000 mg via INTRAVENOUS
  Filled 2014-04-09 (×7): qty 200

## 2014-04-09 MED ORDER — MIDAZOLAM HCL 2 MG/2ML IJ SOLN
2.0000 mg | INTRAMUSCULAR | Status: DC | PRN
  Administered 2014-04-09 – 2014-04-10 (×6): 2 mg via INTRAVENOUS
  Filled 2014-04-09 (×6): qty 2

## 2014-04-09 MED ORDER — MILRINONE IN DEXTROSE 20 MG/100ML IV SOLN
0.3750 ug/kg/min | INTRAVENOUS | Status: DC
  Administered 2014-04-09 – 2014-04-10 (×3): 0.375 ug/kg/min via INTRAVENOUS
  Filled 2014-04-09 (×3): qty 100

## 2014-04-09 MED ORDER — PANTOPRAZOLE SODIUM 40 MG PO PACK
40.0000 mg | PACK | ORAL | Status: DC
  Administered 2014-04-10 – 2014-05-01 (×22): 40 mg
  Filled 2014-04-09 (×25): qty 20

## 2014-04-09 MED ORDER — LORAZEPAM 2 MG/ML IJ SOLN
INTRAMUSCULAR | Status: AC
  Filled 2014-04-09: qty 1

## 2014-04-09 MED ORDER — AMIODARONE LOAD VIA INFUSION
150.0000 mg | Freq: Once | INTRAVENOUS | Status: AC
  Administered 2014-04-09: 150 mg via INTRAVENOUS
  Filled 2014-04-09: qty 83.34

## 2014-04-09 MED ORDER — ATROPINE SULFATE 0.1 MG/ML IJ SOLN
INTRAMUSCULAR | Status: AC
  Filled 2014-04-09: qty 10

## 2014-04-09 NOTE — Progress Notes (Signed)
Pt more agitated and noted desats w/ fighting over vent-notified Dr Tyson [name] w/ order for versed PRN.

## 2014-04-09 NOTE — Progress Notes (Signed)
PULMONARY / CRITICAL CARE MEDICINE   Name: Micheal Ayala MRN: [id number] DOB: 07/05/1955    ADMISSION DATE:  31-Jan-2015 CONSULTATION DATE:  08-13-2014  REFERRING MD :  Dr. Herbie BaltimoreHarding  CHIEF COMPLAINT:  STEMI  INITIAL PRESENTATION:  59 yo smoker with STEMI, cardiogenic shock, VDRF.  He is visiting [place] from [place].  STUDIES:  1/31 Cardiac cath >> severe CAD with occlusion of LAD, circumflex, RCS, ischemic CM >> DES to LAD, circumflex 1/31 Echo >> LVEF 20-25%, severely reduced systolic function 2/04 LHC > DES RCA 2/06 Echo >> EF 25 to 30%, mod LVH, grade 2 diastolic dysfx  SIGNIFICANT EVENTS: 1/31 admit, cardiac cath, impella 2/01 TCTS consulted, transfuse 1 unit PRBC 2/05 impella removed; ?upper GI bleeding 2/08 increased respiratory secretions  SUBJECTIVE:  Increased respiratory secretions, low grade temp.  VITAL SIGNS: Temp:  [98.8 F (37.1 C)-99.9 F (37.7 C)] 99.5 F (37.5 C) (02/08 0557) Pulse Rate:  [92-120] 120 (02/08 0802) Resp:  [12-19] 18 (02/08 0802) BP: (95-102)/(52-57) 102/56 mmHg (02/08 0802) SpO2:  [90 %-97 %] 91 % (02/08 0700) FiO2 (%):  [40 %-80 %] 80 % (02/08 0802) Weight:  [223 lb 8.7 oz (101.4 kg)] 223 lb 8.7 oz (101.4 kg) (02/08 0348) HEMODYNAMICS: PAP: (38-52)/(13-30) 43/24 mmHg CVP:  [12 mmHg-17 mmHg] 12 mmHg PCWP:  [21 mmHg-22 mmHg] 22 mmHg CO:  [5.4 L/min-6.7 L/min] 6.7 L/min CI:  [2.5 L/min/m2-3.1 L/min/m2] 3.1 L/min/m2 VENTILATOR SETTINGS: Vent Mode:  [-] PRVC FiO2 (%):  [40 %-80 %] 80 % Set Rate:  [12 bmp] 12 bmp Vt Set:  [600 mL] 600 mL PEEP:  [5 cmH20] 5 cmH20 Plateau Pressure:  [15 cmH20-26 cmH20] 26 cmH20 INTAKE / OUTPUT:  Intake/Output Summary (Last 24 hours) at 04/09/14 1118 Last data filed at 04/09/14 0817  Gross per 24 hour  Intake 2064.6 ml  Output   5675 ml  Net -3610.4 ml   PHYSICAL EXAMINATION:  Gen: sedated on vent HEENT: NCAT ETT in place PULM: b/l rhonchi CV: RRR, S1S2 AB: BS+, soft, nontender Ext: warm Neuro  : follows commands intermittently   LABS:  CBC  Recent Labs Lab 04/07/14 0344 04/08/14 0440 04/09/14 0400  WBC 11.2* 11.2* 12.8*  HGB 9.7* 9.3* 10.0*  HCT 29.3* 28.5* 30.6*  PLT 86* 108* 197   Coag's  Recent Labs Lab 04/29/2014 0347 04/06/14 0305 04/07/14 0344  APTT 128* 126* 29   BMET  Recent Labs Lab 04/07/14 0344 04/08/14 0440 04/09/14 0400  NA 135 138 140  K 3.4* 3.7 3.7  CL 104 105 103  CO2 24 26 31   BUN 30* 37* 40*  CREATININE 0.86 0.93 1.06  GLUCOSE 124* 115* 108*   Electrolytes  Recent Labs Lab 04/04/14 1914  04/06/14 0305 04/07/14 0344 04/08/14 0440 04/09/14 0400  CALCIUM 7.5*  < > 7.4* 7.4* 7.7* 7.5*  MG  --   --  2.0  --  2.4 2.5  PHOS 1.7*  --   --   --  4.2 4.1  < > = values in this interval not displayed. ABG  Recent Labs Lab 04/09/2014 0354 04/06/14 0500 04/07/14 0330  PHART 7.466* 7.480* 7.416  PCO2ART 33.6* 31.0* 40.3  PO2ART 84.5 92.8 183.0*   Glucose  Recent Labs Lab 04/08/14 0802 04/08/14 1254 04/08/14 1621 04/08/14 1939 04/08/14 2346 04/09/14 0356  GLUCAP 106* 140* 120* 129* 128* 113*   ASSESSMENT / PLAN:  PULMONARY ETT 1/31>>> A:  Acute respiratory failure due to cardiogenic shock and pulmonary edema.   Tobacco  abuse. P:   Full vent support Adjust PEEP/FiO2 to keep SpO2 > 925 >> PEEP adjustment might be limited due to cardiac function Scheduled BD's F/u CXR  CARDIOVASCULAR L Impella 1/31>>>2/5 Introducer 1/31 L femoral >> Introducer 1/31 R femoral >> A:  Cardiogenic shock post STEMI >> improving P: Amiodarone, milrinone, lasix, brilinta per cardiology Continue ASA, lipitor, digoxin, aldactone  RENAL A:  AKI >> resolved. P:   Monitor BMET and UOP Replace electrolytes as needed  GASTROINTESTINAL A:   Nutrition. Constipation. ?upper GI bleed 2/05 >> no further episodes since. P:   protonix BID Tube feeds Bowel regimen  HEMATOLOGIC A:  Thrombocytopenia >> likely from impella >>  imroved. P:  F/u CBC  SQ heparin for DVT prevention  INFECTIOUS A: Low grade temp, increased WBC 2/08 >> Concern for HCAP. P:    Day 1 vancomycin, zosyn  Blood 2/08 >> Sputum 2/08 >>  ENDOCRINE A:   Hyperglycemia. P:   SSI with lantus  NEUROLOGIC A:  Acute encephalopathy 2nd to respiratory failure, cardiogenic shock. P:   RASS goal -1  Updated pt's wife at bedside.  Discussed plan with Dr. Jones Broom.  CC time 35 minutes.  [name], MD [facility] Pulmonary/Critical Care 04/09/2014, 11:34 AM Pager:  [phone] After 3pm call: [phone]

## 2014-04-09 NOTE — Progress Notes (Signed)
Patient ID: Micheal Ayala, male   DOB: Oct 10, 1955, 59 y.o.   MRN: [id number]  Advanced Heart Failure Rounding Note   Subjective:    Remains on vent. Awake but agitated.   Impella removed 2/5.  Off norepinephrine 2/5.   More secretions today. Febrile this am. Now up to 80% FiO2. Had run of SVT this morning. Remains on iv amio.   Echo (2/7) with EF 25-30%, peri-apical akinesis, no LV thrombus noted, normal RV.   Swan:  RA 22 PA  52/30 PCWP 21 Thermo CO/CI  6.7/3.1  CXR 2/8: Worsening CHF   Objective:   Weight Range:  Vital Signs:   Temp:  [98.6 F (37 C)-99.9 F (37.7 C)] 99.5 F (37.5 C) (02/08 0557) Pulse Rate:  [91-120] 120 (02/08 0802) Resp:  [12-19] 18 (02/08 0802) BP: (95-102)/(52-57) 102/56 mmHg (02/08 0802) SpO2:  [90 %-97 %] 91 % (02/08 0700) FiO2 (%):  [40 %-80 %] 80 % (02/08 0802) Weight:  [101.4 kg (223 lb 8.7 oz)] 101.4 kg (223 lb 8.7 oz) (02/08 0348) Last BM Date:  (PTA)  Weight change: Filed Weights   04/07/14 0352 04/08/14 0415 04/09/14 0348  Weight: 105.1 kg (231 lb 11.3 oz) 102.9 kg (226 lb 13.7 oz) 101.4 kg (223 lb 8.7 oz)    Intake/Output:   Intake/Output Summary (Last 24 hours) at 04/09/14 0918 Last data filed at 04/09/14 0817  Gross per 24 hour  Intake 2432.8 ml  Output   5875 ml  Net -3442.2 ml     Physical Exam: General:  Intubated. 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  JVP elevated.  Cor: PMI laterally displaced. Regular tachy  +s3 Lungs: clear Abdomen: soft, nontender, nondistended. No hepatosplenomegaly. No bruits or masses. Hypoactive bowel sounds. Extremities: no cyanosis, clubbing, rash, 1+ ankle edema. R groin Swan ok Neuro: awake on vent. agitated  Telemetry: Sinus tach 115  Labs: Basic Metabolic Panel:  Recent Labs Lab 04/04/14 1914 04/13/2014 0347 04/06/14 0305 04/07/14 0344 04/08/14 0440 04/09/14 0400  NA 134* 135 133*  135 138 140  K 3.3* 3.7 3.5 3.4* 3.7 3.7  CL 105 105 106 104 105 103  CO2 GLUCOSE 203* 145* 150* 124* 115* 108*  BUN 30* 26* 26* 30* 37* 40*  CREATININE 0.90 0.89 0.86 0.86 0.93 1.06  CALCIUM 7.5* 7.5* 7.4* 7.4* 7.7* 7.5*  MG  --   --  2.0  --  2.4 2.5  PHOS 1.7*  --   --   --  4.2 4.1    Liver Function Tests:  Recent Labs Lab 04/04/14 0415 04/04/14 1146 04/04/14 1914 04/21/2014 0347 04/06/14 0305 04/09/14 0400  AST 117*  --   --  95* 79* 100*  ALT 100*  --   --  80* 70* 148*  ALKPHOS 112  --   --  138* 143* 288*  BILITOT 1.7* 1.8*  --  2.3* 1.8* 3.1*  PROT 4.7*  --   --  5.1* 5.0* 6.1  ALBUMIN 1.8*  --  2.0* 1.9* 1.8* 2.0*   No results for input(s): LIPASE, AMYLASE in the last 168 hours. No results for input(s): AMMONIA in the last 168 hours.  CBC:  Recent Labs Lab 04/15/2014 0347 04/06/14 0305 04/06/14 2155 04/07/14 0344 04/08/14 0440 04/09/14 0400  WBC 12.8* 12.5* 10.8* 11.2* 11.2* 12.8*  NEUTROABS 9.2* 8.7*  --  7.8* 8.2* 9.9*  HGB 10.3* 9.5* 9.4* 9.7* 9.3* 10.0*  HCT 29.7* 28.1* 27.6* 29.3* 28.5* 30.6*  MCV 86.1 87.5 87.9 90.2 89.3 90.3  PLT 87* 74* 79* 86* 108* 197    Cardiac Enzymes: No results for input(s): CKTOTAL, CKMB, CKMBINDEX, TROPONINI in the last 168 hours.  BNP: BNP (last 3 results) No results for input(s): PROBNP in the last 8760 hours.   Other results:    Imaging: Dg Chest Port 1 View  04/09/2014   CLINICAL DATA:  Respiratory failure. , history of recent MI with cardiogenic shock  EXAM: PORTABLE CHEST - 1 VIEW  COMPARISON:  Portable chest x-ray of April 08, 2014  FINDINGS: The lungs are well-expanded. The interstitial markings have increased bilaterally. The retrocardiac region on the left is more dense. The cardiac silhouette is mildly enlarged though stable. The central pulmonary vascularity is more engorged with mild cephalization noted.  The endotracheal tube tip lies 4.2 cm above the crotch of the carina. The  esophagogastric tube projects below the inferior margin of the image. The Swan-Ganz catheter tip projects in the midportion of the right main pulmonary artery and is unchanged.  IMPRESSION: Interval deterioration of the appearance of the pulmonary interstitium consistent with worsening of interstitial edema. Bilateral pleural effusions layering posteriorly are suspected as well. The support tubes and lines are in appropriate position.   Electronically Signed   By: David  [place]   On: 04/09/2014 07:19   Dg Chest Port 1 View  04/08/2014   CLINICAL DATA:  Respiratory failure, intubated  EXAM: PORTABLE CHEST - 1 VIEW  COMPARISON:  04/06/2014  FINDINGS: Lungs are essentially clear. No focal consolidation. No pleural effusion or pneumothorax.  Cardiomegaly.  Endotracheal tube terminates 6 cm above the carina.  Inferior approach Swan-Ganz catheter terminates in the right main pulmonary artery. Enteric tube courses into the stomach.  IMPRESSION: Endotracheal tube terminates 6 cm above the carina.  Additional support apparatus as above.   Electronically Signed   By: Charline Bills M.D.   On: 04/08/2014 07:17     Medications:     Scheduled Medications: . antiseptic oral rinse  7 mL Mouth Rinse QID  . aspirin  81 mg Per Tube Daily  . atorvastatin  80 mg Per Tube q1800  . chlorhexidine  15 mL Mouth Rinse BID  . digoxin  0.25 mg Intravenous Daily  . docusate  100 mg Oral BID  . feeding supplement (PRO-STAT SUGAR FREE 64)  60 mL Per Tube QID  . feeding supplement (VITAL HIGH PROTEIN)  1,000 mL Per Tube Q24H  . furosemide  40 mg Intravenous BID  . guaiFENesin  10 mL Per Tube 4 times per day  . heparin subcutaneous  5,000 Units Subcutaneous Q8H  . insulin aspart  0-15 Units Subcutaneous 6 times per day  . insulin glargine  35 Units Subcutaneous Daily  . multivitamin  5 mL Per Tube Daily  . pantoprazole (PROTONIX) IV  40 mg Intravenous Q12H  . sodium chloride  3 mL Intravenous Q12H  . spironolactone   12.5 mg Oral Daily  . ticagrelor  90 mg Per Tube BID    Infusions: . sodium chloride 1,000 mL (11-Apr-2014 0700)  . amiodarone 30 mg/hr (04/09/14 0852)  . fentaNYL infusion INTRAVENOUS 400 mcg/hr (04/09/14 0430)  . milrinone 0.5 mcg/kg/min (04/09/14 0348)    PRN Medications: Place/Maintain arterial line **AND** sodium chloride, sodium chloride, sodium chloride, acetaminophen, bisacodyl, fentaNYL, Influenza vac split quadrivalent PF, midazolam, ondansetron (ZOFRAN) IV, pneumococcal 23 valent vaccine,  sennosides, sodium chloride   Assessment:   1. Cardiogenic shock    --s/p Impella placement 1/31, Impella out 2/5.  2. Acute on chronic systolic HF    --EF 10% on echo    --Repeat echo (2/7) with EF 25-30%, peri-apical akinesis, no LV thrombus, normal RV 3. Anterolateral STEMI 1/31   --due to stent occlusion   --s/p PCI with DES to LAD and OM-2 on 1/31   --s/p PCI with DES to RCA 2/5 4. VDRF 5. CAD 6. Tobacco abuse ongoing 7. 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 Hypomagnesemia/hypnatremia 9. Shock liver 10. Anemia s/p 3u RBCs 11. SVT: Amiodarone started 12. Fever   Plan/Discussion:    From acardia perspective appears relatively stable. Echo on 2/7 showed some improvement in LV systolic function, EF 25-30%.  He continues on milrinone 0.5 and digoxin.    However now appears to be developing PNA/sepsis prohibiting vent wean. Will start vanc/zosyn. Will get bcx and sputum cx. Pull swan and femoral arterial line. Will need triple lumen CV cath. Continue diuresis as tolerated. Drop milrinone to 0.375 and may even be able to go to 0.25. Discussed with Dr. Craige CottaSood from [facility].   Continue Brilinta, ASA, statin for CAD.  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 He had coffee grounds from OGT.  However, this has resolved and hemoglobin basically stable.  Continue PPI.  On prophylactic heparin today.  If hemoglobin  remains stable, can start warfarin.   Continue amiodarone for SVT.   The patient is critically ill with multiple organ systems failure and requires high complexity decision making for assessment and support, frequent evaluation and titration of therapies, application of advanced monitoring technologies and extensive interpretation of multiple databases.   Critical Care Time devoted to patient care services described in this note is 45 Minutes.   Length of Stay: 8   Arvilla Meresaniel Lathen Seal MD 04/09/2014, 9:18 AM  Advanced Heart Failure Team Pager [phone] (M-F; 7a - 4p)  Please contact [facility] for night-coverage after hours (4p -7a ) and weekends on [email or web address]

## 2014-04-09 NOTE — Procedures (Signed)
Central Venous Catheter Insertion Procedure Note Job FoundsFrederick Krall [id number] 10/12/1955  Procedure: Insertion of Central Venous Catheter Indications: Assessment of intravascular volume, Drug and/or fluid administration and Frequent blood sampling  Procedure Details Consent: Risks of procedure as well as the alternatives and risks of each were explained to the (patient/caregiver).  Consent for procedure obtained. Time Out: Verified patient identification, verified procedure, site/side was marked, verified correct patient position, special equipment/implants available, medications/allergies/relevent history reviewed, required imaging and test results available.  Performed  Maximum sterile technique was used including antiseptics, cap, gloves, gown, hand hygiene, mask and sheet. Skin prep: Chlorhexidine; local anesthetic administered A antimicrobial bonded/coated triple lumen catheter was placed in the left subclavian vein using the Seldinger technique.  Evaluation Blood flow good Complications: No apparent complications Patient did tolerate procedure well. Chest X-ray ordered to verify placement.  CXR: pending.  Procedure performed under direct ultrasound guidance for real time vessel cannulation.      Rutherford Guysahul Desai, [place] - [facility] [place] [facility] Pgr: [phone]  or [phone] 04/09/2014, 1:06 PM  I was present for procedure.  Coralyn HellingVineet Laster Appling, MD [facility] 04/09/2014, 2:59 PM Pager:  [phone] After 3pm call: [phone]

## 2014-04-09 NOTE — Progress Notes (Signed)
**Note De-identified  Obfuscation** Sputum collected and sent to lab 

## 2014-04-09 NOTE — Progress Notes (Signed)
ANTIBIOTIC CONSULT NOTE   Pharmacy Consult for vancomycin and zosyn Indication: rule out sepsis  No Known Allergies  Patient Measurements: Height: 5\' 11"  (180.3 cm) Weight: 223 lb 8.7 oz (101.4 kg) IBW/kg (Calculated) : 75.3   Vital Signs: Temp: 99.5 F (37.5 C) (02/08 0557) Temp Source: Core (Comment) (02/08 0400) BP: 102/56 mmHg (02/08 0802) Pulse Rate: 120 (02/08 0802) Intake/Output from previous day: 02/07 0701 - 02/08 0700 In: 2608 [I.V.:1708; NG/GT:850; IV Piggyback:50] Out: 6025 [Urine:6025] Intake/Output from this shift: Total I/O In: 3 [I.V.:3] Out: -   Labs:  Recent Labs  04/07/14 0344 04/08/14 0440 04/09/14 0400  WBC 11.2* 11.2* 12.8*  HGB 9.7* 9.3* 10.0*  PLT 86* 108* 197  CREATININE 0.86 0.93 1.06   Estimated Creatinine Clearance: 92.1 mL/min (by C-G formula based on Cr of 1.06). No results for input(s): VANCOTROUGH, VANCOPEAK, VANCORANDOM, GENTTROUGH, GENTPEAK, GENTRANDOM, TOBRATROUGH, TOBRAPEAK, TOBRARND, AMIKACINPEAK, AMIKACINTROU, AMIKACIN in the last 72 hours.   Microbiology: Recent Results (from the past 720 hour(s))  MRSA PCR Screening     Status: None   Collection Time: 01-09-15  8:01 AM  Result Value Ref Range Status   MRSA by PCR NEGATIVE NEGATIVE Final    Comment:        The GeneXpert MRSA Assay (FDA approved for NASAL specimens only), is one component of a comprehensive MRSA colonization surveillance program. It is not intended to diagnose MRSA infection nor to guide or monitor treatment for MRSA infections.   Culture, blood (routine x 2)     Status: None   Collection Time: 04/02/14  1:32 PM  Result Value Ref Range Status   Specimen Description BLOOD RIGHT HAND  Final   Special Requests BOTTLES DRAWN AEROBIC AND ANAEROBIC 10CC  Final   Culture   Final    NO GROWTH 5 DAYS Note: Culture results may be compromised due to an excessive volume of blood received in culture bottles. [facility]formed at [facility]    Report  Status 04/08/2014 FINAL  Final  Culture, blood (routine x 2)     Status: None   Collection Time: 04/02/14  2:12 PM  Result Value Ref Range Status   Specimen Description BLOOD RIGHT ARM  Final   Special Requests BOTTLES DRAWN AEROBIC AND ANAEROBIC 5CC  Final   Culture   Final    NO GROWTH 5 DAYS Performed at [facility]    Report Status 04/08/2014 FINAL  Final    Medical History: Past Medical History  Diagnosis Date  . MI (myocardial infarction)     Assessment: 59 year old male with low grade temp and slightly elevated wbc, possibly developing PNA/sepsis from existing lines. Will start empiric vancomycin and zosyn. Renal function is normal, will pan culture. Previous blood cultures from 2/1 were negative.  Goal of Therapy:  Vancomycin trough level 15-20 mcg/ml  Plan:  Measure antibiotic drug levels at steady state Follow up culture results Vancomycin 2g IV x1 now then 1g q12 hours  Zosyn 3.375g IV q8 hours  Sheppard CoilFrank Wilson PharmD., BCPS Clinical Pharmacist Pager [phone] 04/09/2014 11:09 AM

## 2014-04-10 LAB — GLUCOSE, CAPILLARY
GLUCOSE-CAPILLARY: 172 mg/dL — AB (ref 70–99)
Glucose-Capillary: 172 mg/dL — ABNORMAL HIGH (ref 70–99)
Glucose-Capillary: 109 mg/dL — ABNORMAL HIGH (ref 70–99)
Glucose-Capillary: 130 mg/dL — ABNORMAL HIGH (ref 70–99)
Glucose-Capillary: 165 mg/dL — ABNORMAL HIGH (ref 70–99)
Glucose-Capillary: 127 mg/dL — ABNORMAL HIGH (ref 70–99)
Glucose-Capillary: 127 mg/dL — ABNORMAL HIGH (ref 70–99)

## 2014-04-10 LAB — BASIC METABOLIC PANEL
Anion gap: 11 (ref 5–15)
BUN: 41 mg/dL — AB (ref 6–23)
CHLORIDE: 103 mmol/L (ref 96–112)
CO2: 28 mmol/L (ref 19–32)
CREATININE: 1.07 mg/dL (ref 0.50–1.35)
Calcium: 7.5 mg/dL — ABNORMAL LOW (ref 8.4–10.5)
GFR calc non Af Amer: 75 mL/min — ABNORMAL LOW (ref >90)
GFR, EST AFRICAN AMERICAN: 87 mL/min — AB (ref >90)
GLUCOSE: 127 mg/dL — AB (ref 70–99)
Potassium: 3.2 mmol/L — ABNORMAL LOW (ref 3.5–5.1)
Sodium: 142 mmol/L (ref 135–145)

## 2014-04-10 LAB — CARBOXYHEMOGLOBIN
Carboxyhemoglobin: 1.4 % (ref 0.5–1.5)
Methemoglobin: 0.7 % (ref 0.0–1.5)
O2 Saturation: 82.8 %
Total hemoglobin: 9.5 g/dL — ABNORMAL LOW (ref 13.5–18.0)

## 2014-04-10 LAB — MAGNESIUM: Magnesium: 2.6 mg/dL — ABNORMAL HIGH (ref 1.5–2.5)

## 2014-04-10 LAB — PHOSPHORUS: PHOSPHORUS: 3.1 mg/dL (ref 2.3–4.6)

## 2014-04-10 MED ORDER — LEVALBUTEROL HCL 0.63 MG/3ML IN NEBU
0.6300 mg | INHALATION_SOLUTION | Freq: Four times a day (QID) | RESPIRATORY_TRACT | Status: DC
  Administered 2014-04-10 – 2014-05-02 (×86): 0.63 mg via RESPIRATORY_TRACT
  Filled 2014-04-10 (×164): qty 3

## 2014-04-10 MED ORDER — POTASSIUM CHLORIDE CRYS ER 20 MEQ PO TBCR
40.0000 meq | EXTENDED_RELEASE_TABLET | Freq: Once | ORAL | Status: AC
  Administered 2014-04-10: 40 meq via ORAL
  Filled 2014-04-10: qty 2

## 2014-04-10 MED ORDER — SODIUM CHLORIDE 0.9 % IV SOLN
2.0000 mg/h | INTRAVENOUS | Status: DC
  Administered 2014-04-10 (×2): 2 mg/h via INTRAVENOUS
  Filled 2014-04-10 (×2): qty 10

## 2014-04-10 MED ORDER — MIDAZOLAM HCL 2 MG/2ML IJ SOLN: 2.0000 mg | INTRAMUSCULAR | Status: DC

## 2014-04-10 MED ORDER — MILRINONE IN DEXTROSE 20 MG/100ML IV SOLN
0.1250 ug/kg/min | INTRAVENOUS | Status: DC
  Administered 2014-04-10 – 2014-04-11 (×2): 0.25 ug/kg/min via INTRAVENOUS
  Administered 2014-04-12: 0.125 ug/kg/min via INTRAVENOUS
  Filled 2014-04-10 (×3): qty 100

## 2014-04-10 MED ORDER — AMIODARONE LOAD VIA INFUSION
150.0000 mg | Freq: Once | INTRAVENOUS | Status: AC
  Administered 2014-04-10: 150 mg via INTRAVENOUS

## 2014-04-10 MED ORDER — FUROSEMIDE 10 MG/ML IJ SOLN
40.0000 mg | Freq: Every day | INTRAMUSCULAR | Status: DC
  Administered 2014-04-11: 40 mg via INTRAVENOUS
  Filled 2014-04-10 (×2): qty 4

## 2014-04-10 MED ORDER — LEVALBUTEROL HCL 0.63 MG/3ML IN NEBU
0.6300 mg | INHALATION_SOLUTION | RESPIRATORY_TRACT | Status: DC | PRN
  Administered 2014-04-14: 0.63 mg via RESPIRATORY_TRACT

## 2014-04-10 MED ORDER — POTASSIUM CHLORIDE 20 MEQ/15ML (10%) PO SOLN
40.0000 meq | Freq: Once | ORAL | Status: AC
  Administered 2014-04-10: 40 meq via ORAL
  Filled 2014-04-10: qty 30

## 2014-04-10 MED ORDER — IPRATROPIUM BROMIDE 0.02 % IN SOLN
0.5000 mg | Freq: Four times a day (QID) | RESPIRATORY_TRACT | Status: DC
  Administered 2014-04-10 – 2014-05-02 (×87): 0.5 mg via RESPIRATORY_TRACT
  Filled 2014-04-10 (×87): qty 2.5

## 2014-04-10 MED ORDER — LEVALBUTEROL HCL 0.63 MG/3ML IN NEBU: 0.6300 mg | INHALATION_SOLUTION | Freq: Four times a day (QID) | RESPIRATORY_TRACT | Status: DC

## 2014-04-10 MED ORDER — IPRATROPIUM BROMIDE 0.02 % IN SOLN: 0.5000 mg | Freq: Four times a day (QID) | RESPIRATORY_TRACT | Status: DC

## 2014-04-10 NOTE — Progress Notes (Addendum)
Patient ID: Micheal Ayala, male   DOB: 1955-04-19, 59 y.o.   MRN: [id number]  Advanced Heart Failure Rounding Note   Subjective:    Remains on vent. Awake but agitated.   Impella removed 2/5.  Off norepinephrine 2/5.   Echo (2/7) with EF 25-30%, peri-apical akinesis, no LV thrombus noted, normal RV.   Was febrile with copious secretions yesterday. Lines removed. Started on broad spectrum abx for presumed VAP. Remains on 100% FiO2. Remains febrile. SBP 80-90s.   Continues to be agitated requiring sedation. CVP 6   Objective:   Weight Range:  Vital Signs:   Temp:  [97.9 F (36.6 C)-100.9 F (38.3 C)] 99.8 F (37.7 C) (02/09 0000) Pulse Rate:  [100-125] 102 (02/09 0530) Resp:  [12-28] 15 (02/09 0530) BP: (85-118)/(50-70) 91/59 mmHg (02/09 0530) SpO2:  [84 %-96 %] 95 % (02/09 0530) FiO2 (%):  [70 %-100 %] 100 % (02/09 0300) Weight:  [99.6 kg (219 lb 9.3 oz)] 99.6 kg (219 lb 9.3 oz) (02/09 0243) Last BM Date:  (PTA)  Weight change: Filed Weights   04/08/14 0415 04/09/14 0348 04/10/14 0243  Weight: 102.9 kg (226 lb 13.7 oz) 101.4 kg (223 lb 8.7 oz) 99.6 kg (219 lb 9.3 oz)    Intake/Output:   Intake/Output Summary (Last 24 hours) at 04/10/14 0608 Last data filed at 04/10/14 0541  Gross per 24 hour  Intake 3161.83 ml  Output   3275 ml  Net -113.17 ml     Physical Exam: General:  Intubated. 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  JVP flat Cor: PMI laterally displaced. Regular tachy  +s3 Lungs: clear Abdomen: soft, nontender, nondistended. No hepatosplenomegaly. No bruits or masses. Hypoactive bowel sounds. Extremities: no cyanosis, clubbing, rash, no edema. Neuro: awake on vent. agitated  Telemetry: Sinus tach 100  Labs: Basic Metabolic Panel:  Recent Labs Lab 04/04/14 1914  04/06/14 0305 04/07/14 0344 04/08/14 0440 04/09/14 0400 04/10/14 0459  NA 134*  < > 133* 135  138 140 142  K 3.3*  < > 3.5 3.4* 3.7 3.7 3.2*  CL 105  < > 106 104 105 103 103  CO2 22  < > GLUCOSE 203*  < > 150* 124* 115* 108* 127*  BUN 30*  < > 26* 30* 37* 40* 41*  CREATININE 0.90  < > 0.86 0.86 0.93 1.06 1.07  CALCIUM 7.5*  < > 7.4* 7.4* 7.7* 7.5* 7.5*  MG  --   --  2.0  --  2.4 2.5 2.6*  PHOS 1.7*  --   --   --  4.2 4.1 3.1  < > = values in this interval not displayed.  Liver Function Tests:  Recent Labs Lab 04/04/14 0415 04/04/14 1146 04/04/14 1914 04/10/2014 0347 04/06/14 0305 04/09/14 0400  AST 117*  --   --  95* 79* 100*  ALT 100*  --   --  80* 70* 148*  ALKPHOS 112  --   --  138* 143* 288*  BILITOT 1.7* 1.8*  --  2.3* 1.8* 3.1*  PROT 4.7*  --   --  5.1* 5.0* 6.1  ALBUMIN 1.8*  --  2.0* 1.9* 1.8* 2.0*   No results for input(s): LIPASE, AMYLASE in the last 168 hours. No results for input(s): AMMONIA in the last 168 hours.  CBC:  Recent Labs Lab 04/07/2014 0347 04/06/14 0305 04/06/14 2155 04/07/14 0344  04/08/14 0440 04/09/14 0400  WBC 12.8* 12.5* 10.8* 11.2* 11.2* 12.8*  NEUTROABS 9.2* 8.7*  --  7.8* 8.2* 9.9*  HGB 10.3* 9.5* 9.4* 9.7* 9.3* 10.0*  HCT 29.7* 28.1* 27.6* 29.3* 28.5* 30.6*  MCV 86.1 87.5 87.9 90.2 89.3 90.3  PLT 87* 74* 79* 86* 108* 197    Cardiac Enzymes: No results for input(s): CKTOTAL, CKMB, CKMBINDEX, TROPONINI in the last 168 hours.  BNP: BNP (last 3 results) No results for input(s): PROBNP in the last 8760 hours.   Other results:    Imaging: Dg Chest Port 1 View  04/09/2014   CLINICAL DATA:  Subsequent encounter for line Placement.  Pneumonia.  EXAM: PORTABLE CHEST - 1 VIEW  COMPARISON:  Earlier today at 0448 hr  FINDINGS: Endotracheal tube terminates 5.8 cm above carina. Nasogastric tube extends beyond the inferior aspect of the film. A left-sided subclavian line is new. Terminates at the mid to low SVC. There is a Publishing rights managerwan Ganz catheter from an inferior approach. Terminates in the proximal right pulmonary artery.  Similar loop within the right atrium.  Cardiomegaly accentuated by AP portable technique. Layering bilateral pleural effusions again identified. No pneumothorax. Moderate interstitial edema is worse on the right and similar. Slight progression of right infrahilar airspace disease.  IMPRESSION: Left subclavian line terminating at the mid to low SVC, without pneumothorax.  Persistent congestive heart failure. Slight worsening of right infrahilar airspace disease, favored to represent alveolar edema. Persistent layering bilateral pleural effusions.   Electronically Signed   By: Jeronimo GreavesKyle  Talbot M.D.   On: 04/09/2014 13:50   Dg Chest Port 1 View  04/09/2014   CLINICAL DATA:  Respiratory failure. , history of recent MI with cardiogenic shock  EXAM: PORTABLE CHEST - 1 VIEW  COMPARISON:  Portable chest x-ray of April 08, 2014  FINDINGS: The lungs are well-expanded. The interstitial markings have increased bilaterally. The retrocardiac region on the left is more dense. The cardiac silhouette is mildly enlarged though stable. The central pulmonary vascularity is more engorged with mild cephalization noted.  The endotracheal tube tip lies 4.2 cm above the crotch of the carina. The esophagogastric tube projects below the inferior margin of the image. The Swan-Ganz catheter tip projects in the midportion of the right main pulmonary artery and is unchanged.  IMPRESSION: Interval deterioration of the appearance of the pulmonary interstitium consistent with worsening of interstitial edema. Bilateral pleural effusions layering posteriorly are suspected as well. The support tubes and lines are in appropriate position.   Electronically Signed   By: David  Swazil[place]   On: 04/09/2014 07:19     Medications:     Scheduled Medications: . antiseptic oral rinse  7 mL Mouth Rinse QID  . aspirin  81 mg Per Tube Daily  . atorvastatin  80 mg Per Tube q1800  . chlorhexidine  15 mL Mouth Rinse BID  . digoxin  0.25 mg Intravenous Daily   . docusate  100 mg Oral BID  . feeding supplement (PRO-STAT SUGAR FREE 64)  60 mL Per Tube QID  . feeding supplement (VITAL HIGH PROTEIN)  1,000 mL Per Tube Q24H  . furosemide  40 mg Intravenous BID  . guaiFENesin  10 mL Per Tube 4 times per day  . heparin subcutaneous  5,000 Units Subcutaneous Q8H  . insulin aspart  0-15 Units Subcutaneous 6 times per day  . insulin glargine  35 Units Subcutaneous Daily  . ipratropium-albuterol  3 mL Nebulization Q6H  . multivitamin  5 mL Per  Tube Daily  . pantoprazole sodium  40 mg Per Tube Q24H  . piperacillin-tazobactam (ZOSYN)  IV  3.375 g Intravenous 3 times per day  . sodium chloride  3 mL Intravenous Q12H  . spironolactone  12.5 mg Oral Daily  . ticagrelor  90 mg Per Tube BID  . vancomycin  1,000 mg Intravenous Q12H    Infusions: . sodium chloride 1,000 mL (03/10/2014 0700)  . amiodarone 30 mg/hr (04/10/14 0324)  . fentaNYL infusion INTRAVENOUS 300 mcg/hr (04/10/14 0542)  . midazolam (VERSED) infusion 2 mg/hr (04/10/14 0320)  . milrinone 0.375 mcg/kg/min (04/10/14 0242)    PRN Medications: Place/Maintain arterial line **AND** sodium chloride, sodium chloride, sodium chloride, acetaminophen, bisacodyl, fentaNYL, Influenza vac split quadrivalent PF, ondansetron (ZOFRAN) IV, pneumococcal 23 valent vaccine, sennosides, sodium chloride   Assessment:   1. Cardiogenic shock    --s/p Impella placement 1/31, Impella out 2/5.  2. Acute on chronic systolic HF    --EF 10% on echo    --Repeat echo (2/7) with EF 25-30%, peri-apical akinesis, no LV thrombus, normal RV 3. Anterolateral STEMI 1/31   --due to stent occlusion   --s/p PCI with DES to LAD and OM-2 on 1/31   --s/p PCI with DES to RCA 2/5 4. VDRF 5. CAD 6. Tobacco abuse ongoing 7. 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 Hypomagnesemia/hypnatremia 9. Shock liver 10. Anemia s/p 3u RBCs 11. SVT: Amiodarone started 12. Fever 13. Pneumonia, hospital  acquired   Plan/Discussion:    From a cardiac perspective appears relatively stable. Echo on 2/7 showed some improvement in LV systolic function, EF 25-30%.  He continues on milrinone 0.375 and digoxin.  CVP 6 - will hold lasix one day and change lasix to 40 IV daily starting 2/10. Wean milrinone to 0.25.   However now appears to be developing PNA/sepsis prohibiting vent wean. Vanc/zosyn started 2/8. Bcx and sputum cx drawn. P/CCM managing. May need trach. Can add back norepi for BP support as needed.   Continue Brilinta, ASA, statin for CAD.  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  If hemoglobin remains stable, can start warfarin.   Continue amiodarone for SVT.   The patient is critically ill with multiple organ systems failure and requires high complexity decision making for assessment and support, frequent evaluation and titration of therapies, application of advanced monitoring technologies and extensive interpretation of multiple databases.   Critical Care Time devoted to patient care services described in this note is 35 Minutes.   Length of Stay: 9   Arvilla Meres MD 04/10/2014, 6:08 AM  Advanced Heart Failure Team Pager [phone] (M-F; 7a - 4p)  Please contact [facility] for night-coverage after hours (4p -7a ) and weekends on [email or web address]

## 2014-04-10 NOTE — Progress Notes (Addendum)
Pt noted to have a new onset of cheynne-stokes respirations. 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 Unable to follow verbal commands, but purposeful movement is present. Dr. Darrick Pennaeterding notified. Will continue to monitor.

## 2014-04-10 NOTE — Progress Notes (Signed)
  Called to his room by staff nurse for Afib RVR with rate 140-150s. SBP 90s. Currently on amiodarone 30 mg per hour and milrinone at 0.25 mcg.   He was given 150 mg amiodarone bolus and amiodarone was increased to 60 mg hour. Check CO-OX now.   Chemically converted to Sinus Tach low 100s. Continue current plan.   CLEGG,AMY NP-C  9:23 AM     

## 2014-04-10 NOTE — Progress Notes (Signed)
PULMONARY / CRITICAL CARE MEDICINE   Name: Micheal Ayala MRN: [id number] DOB: 03/13/1955    ADMISSION DATE:  03/17/2014 CONSULTATION DATE:  03/06/2014  REFERRING MD :  Dr. Herbie BaltimoreHarding  CHIEF COMPLAINT:  STEMI  INITIAL PRESENTATION:  59 yo smoker with STEMI, cardiogenic shock, VDRF.  [place] is [place] from [place].  STUDIES:  1/31 Cardiac cath >> severe CAD with occlusion of LAD, circumflex, RCS, ischemic CM >> DES to LAD, circumflex 1/31 Echo >> LVEF 20-25%, severely reduced systolic function 2/04 LHC > DES RCA 2/06 Echo >> EF 25 to 30%, mod LVH, grade 2 diastolic dysfx  SIGNIFICANT EVENTS: 1/31 admit, cardiac cath, impella 2/01 TCTS consulted, transfuse 1 unit PRBC 2/05 impella removed; ?upper GI bleeding 2/08 increased respiratory secretions  SUBJECTIVE:  Tolerated some pressure support, but still remains on increased PEEP and FiO2.  VITAL SIGNS: Temp:  [97.9 F (36.6 C)-100.9 F (38.3 C)] 99.8 F (37.7 C) (02/09 0000) Pulse Rate:  [98-124] 98 (02/09 0700) Resp:  [12-28] 13 (02/09 0700) BP: (85-118)/(50-70) 92/52 mmHg (02/09 0700) SpO2:  [86 %-96 %] 95 % (02/09 0700) FiO2 (%):  [80 %-100 %] 90 % (02/09 0735) Weight:  [219 lb 9.3 oz (99.6 kg)] 219 lb 9.3 oz (99.6 kg) (02/09 0243) HEMODYNAMICS: PAP: (39-58)/(27-37) 56/35 mmHg CVP:  [6 mmHg-28 mmHg] 6 mmHg VENTILATOR SETTINGS: Vent Mode:  [-] PRVC FiO2 (%):  [80 %-100 %] 90 % Set Rate:  [12 bmp] 12 bmp Vt Set:  [600 mL] 600 mL PEEP:  [8 cmH20] 8 cmH20 Plateau Pressure:  [20 cmH20-27 cmH20] 22 cmH20 INTAKE / OUTPUT:  Intake/Output Summary (Last 24 hours) at 04/10/14 0831 Last data filed at 04/10/14 0700  Gross per 24 hour  Intake 3232.73 ml  Output   3350 ml  Net -117.27 ml   PHYSICAL EXAMINATION:  Gen: no distress HEENT: Rt eye enucleated, ETT in place PULM: scattered rhonchi, better air movement CV: irregular, tachycardic AB: BS+, soft, nontender Ext: no edema Neuro: RASS -1, follows  comands  LABS:  CBC  Recent Labs Lab 04/07/14 0344 04/08/14 0440 04/09/14 0400  WBC 11.2* 11.2* 12.8*  HGB 9.7* 9.3* 10.0*  HCT 29.3* 28.5* 30.6*  PLT 86* 108* 197   Coag's  Recent Labs Lab 2014/06/06 0347 04/06/14 0305 04/07/14 0344  APTT 128* 126* 29   BMET  Recent Labs Lab 04/08/14 0440 04/09/14 0400 04/10/14 0459  NA 138 140 142  K 3.7 3.7 3.2*  CL 105 103 103  CO2 26 31 28   BUN 37* 40* 41*  CREATININE 0.93 1.06 1.07  GLUCOSE 115* 108* 127*   Electrolytes  Recent Labs Lab 04/08/14 0440 04/09/14 0400 04/10/14 0459  CALCIUM 7.7* 7.5* 7.5*  MG 2.4 2.5 2.6*  PHOS 4.2 4.1 3.1   ABG  Recent Labs Lab 2014/06/06 0354 04/06/14 0500 04/07/14 0330  PHART 7.466* 7.480* 7.416  PCO2ART 33.6* 31.0* 40.3  PO2ART 84.5 92.8 183.0*   Glucose  Recent Labs Lab 04/08/14 0802 04/08/14 1254 04/08/14 1621 04/08/14 1939 04/08/14 2346 04/09/14 0356  GLUCAP 106* 140* 120* 129* 128* 113*   ASSESSMENT / PLAN:  PULMONARY ETT 1/31>>> A:  Acute respiratory failure due to cardiogenic shock and pulmonary edema.   Tobacco abuse. P:   Full vent support >> might need trach if not able to extubate soon Adjust PEEP/FiO2 to keep SpO2 > 92% >> PEEP adjustment might be limited due to cardiac function Scheduled BD's >> change to atrovent and xopenex in setting of tachycardia F/u  CXR  CARDIOVASCULAR L Impella 1/31>>>2/5 Lt [place] CVL 2/08 >> A:  Cardiogenic shock post STEMI >> improving. SVT, A fib with RVR. P: Amiodarone, milrinone, lasix, brilinta per cardiology Continue ASA, lipitor, digoxin, aldactone  RENAL A:  AKI >> resolved. Hypokalemia. P:   Monitor BMET and UOP Replace electrolytes as needed  GASTROINTESTINAL A:   Nutrition. Constipation. ?upper GI bleed 2/05 >> no further episodes since. P:   protonix BID Tube feeds Bowel regimen  HEMATOLOGIC A:  Thrombocytopenia >> likely from impella >> imroved. P:  F/u CBC  SQ heparin for DVT  prevention  INFECTIOUS A: Low grade temp, increased WBC 2/08 >> Concern for HCAP. P:    Day 2 vancomycin, zosyn  Blood 2/08 >> Sputum 2/08 >>  ENDOCRINE A:   Hyperglycemia. P:   SSI with lantus  NEUROLOGIC A:  Acute encephalopathy 2nd to respiratory failure, cardiogenic shock. P:   RASS goal -1   CC time 35 minutes.  [name], MD [facility] Pulmonary/Critical Care 04/10/2014, 8:31 AM Pager:  [phone] After 3pm call: [phone]

## 2014-04-10 NOTE — Progress Notes (Signed)
eLink Physician-Brief Progress Note Patient Name: Micheal Ayala DOB: 11/06/1955 MRN: [id number]   Date of Service  04/10/2014  HPI/Events of Note  Patient continues to be agitated on vent despite max fentanyl 400 IV and q2 hour versed 2 mg.  eICU Interventions  Plan: Add continuous versed 2 mg/hr     Intervention Category Major Interventions: Delirium, psychosis, severe agitation - evaluation and management  Janyia Guion 04/10/2014, 2:48 AM

## 2014-04-11 ENCOUNTER — Encounter (HOSPITAL_COMMUNITY): Payer: Self-pay

## 2014-04-11 ENCOUNTER — Inpatient Hospital Stay (HOSPITAL_COMMUNITY): Payer: Medicare Other

## 2014-04-11 LAB — PHOSPHORUS: Phosphorus: 2.2 mg/dL — ABNORMAL LOW (ref 2.3–4.6)

## 2014-04-11 LAB — CBC
HEMATOCRIT: 30.3 % — AB (ref 39.0–52.0)
Hemoglobin: 9.5 g/dL — ABNORMAL LOW (ref 13.0–17.0)
MCH: 28.6 pg (ref 26.0–34.0)
MCHC: 31.4 g/dL (ref 30.0–36.0)
MCV: 91.3 fL (ref 78.0–100.0)
Platelets: 390 10*3/uL (ref 150–400)
RBC: 3.32 MIL/uL — ABNORMAL LOW (ref 4.22–5.81)
RDW: 15.7 % — ABNORMAL HIGH (ref 11.5–15.5)
WBC: 14.6 10*3/uL — AB (ref 4.0–10.5)

## 2014-04-11 LAB — GLUCOSE, CAPILLARY
GLUCOSE-CAPILLARY: 127 mg/dL — AB (ref 70–99)
Glucose-Capillary: 153 mg/dL — ABNORMAL HIGH (ref 70–99)
Glucose-Capillary: 138 mg/dL — ABNORMAL HIGH (ref 70–99)
Glucose-Capillary: 123 mg/dL — ABNORMAL HIGH (ref 70–99)
Glucose-Capillary: 152 mg/dL — ABNORMAL HIGH (ref 70–99)
Glucose-Capillary: 128 mg/dL — ABNORMAL HIGH (ref 70–99)
Glucose-Capillary: 138 mg/dL — ABNORMAL HIGH (ref 70–99)

## 2014-04-11 LAB — BASIC METABOLIC PANEL
Anion gap: 8 (ref 5–15)
BUN: 53 mg/dL — ABNORMAL HIGH (ref 6–23)
CALCIUM: 7.6 mg/dL — AB (ref 8.4–10.5)
CO2: 29 mmol/L (ref 19–32)
Chloride: 106 mmol/L (ref 96–112)
Creatinine, Ser: 1.18 mg/dL (ref 0.50–1.35)
GFR calc non Af Amer: 66 mL/min — ABNORMAL LOW (ref >90)
GFR, EST AFRICAN AMERICAN: 77 mL/min — AB (ref >90)
Glucose, Bld: 128 mg/dL — ABNORMAL HIGH (ref 70–99)
Potassium: 3.6 mmol/L (ref 3.5–5.1)
SODIUM: 143 mmol/L (ref 135–145)

## 2014-04-11 LAB — POCT I-STAT 3, ART BLOOD GAS (G3+)
ACID-BASE EXCESS: 3 mmol/L — AB (ref 0.0–2.0)
BICARBONATE: 26.5 meq/L — AB (ref 20.0–24.0)
O2 SAT: 93 %
PO2 ART: 65 mmHg — AB (ref 80.0–100.0)
TCO2: 28 mmol/L (ref 0–100)
pCO2 arterial: 37.8 mmHg (ref 35.0–45.0)
pH, Arterial: 7.454 — ABNORMAL HIGH (ref 7.350–7.450)

## 2014-04-11 LAB — CULTURE, RESPIRATORY W GRAM STAIN

## 2014-04-11 LAB — CULTURE, RESPIRATORY: SPECIAL REQUESTS: NORMAL

## 2014-04-11 LAB — CARBOXYHEMOGLOBIN
Carboxyhemoglobin: 1.3 % (ref 0.5–1.5)
METHEMOGLOBIN: 1 % (ref 0.0–1.5)
O2 Saturation: 70.6 %
Total hemoglobin: 9.8 g/dL — ABNORMAL LOW (ref 13.5–18.0)

## 2014-04-11 LAB — MAGNESIUM: MAGNESIUM: 2.8 mg/dL — AB (ref 1.5–2.5)

## 2014-04-11 LAB — VANCOMYCIN, TROUGH
Vancomycin Tr: 11.5 ug/mL (ref 10.0–20.0)
Vancomycin Tr: 19.3 ug/mL (ref 10.0–20.0)

## 2014-04-11 MED ORDER — NOREPINEPHRINE BITARTRATE 1 MG/ML IV SOLN
0.0000 ug/min | INTRAVENOUS | Status: DC
  Administered 2014-04-11: 2 ug/min via INTRAVENOUS
  Administered 2014-04-12 – 2014-04-14 (×2): 10 ug/min via INTRAVENOUS
  Administered 2014-04-17: 6 ug/min via INTRAVENOUS
  Administered 2014-04-19: 8 ug/min via INTRAVENOUS
  Administered 2014-04-20: 17 ug/min via INTRAVENOUS
  Administered 2014-04-21: 35 ug/min via INTRAVENOUS
  Administered 2014-04-21: 26 ug/min via INTRAVENOUS
  Administered 2014-04-22: 25 ug/min via INTRAVENOUS
  Administered 2014-04-23: 3 ug/min via INTRAVENOUS
  Filled 2014-04-11 (×15): qty 16

## 2014-04-11 MED ORDER — AMIODARONE HCL IN DEXTROSE 360-4.14 MG/200ML-% IV SOLN
INTRAVENOUS | Status: AC
  Filled 2014-04-11: qty 200

## 2014-04-11 MED ORDER — LEVALBUTEROL HCL 0.63 MG/3ML IN NEBU
INHALATION_SOLUTION | RESPIRATORY_TRACT | Status: AC
  Administered 2014-04-11: 01:00:00
  Filled 2014-04-11: qty 3

## 2014-04-11 MED ORDER — SODIUM CHLORIDE 0.9 % IV SOLN
INTRAVENOUS | Status: DC | PRN
Start: 2014-04-11 — End: 2014-04-27
  Administered 2014-04-15 – 2014-04-19 (×2): via INTRA_ARTERIAL

## 2014-04-11 MED ORDER — IPRATROPIUM BROMIDE 0.02 % IN SOLN
RESPIRATORY_TRACT | Status: AC
  Administered 2014-04-11: 01:00:00
  Filled 2014-04-11: qty 2.5

## 2014-04-11 MED ORDER — SODIUM CHLORIDE 0.9 % IV SOLN
0.0000 mg/h | INTRAVENOUS | Status: DC
  Administered 2014-04-11 (×2): 8 mg/h via INTRAVENOUS
  Administered 2014-04-12: 12 mg/h via INTRAVENOUS
  Administered 2014-04-12: 8 mg/h via INTRAVENOUS
  Administered 2014-04-12: 12 mg/h via INTRAVENOUS
  Administered 2014-04-13 – 2014-04-14 (×5): 8 mg/h via INTRAVENOUS
  Administered 2014-04-15: 4 mg/h via INTRAVENOUS
  Administered 2014-04-17 (×2): 6 mg/h via INTRAVENOUS
  Administered 2014-04-20: 2 mg/h via INTRAVENOUS
  Filled 2014-04-11 (×20): qty 10

## 2014-04-11 MED ORDER — MIDAZOLAM BOLUS VIA INFUSION
1.0000 mg | INTRAVENOUS | Status: DC | PRN
  Filled 2014-04-11: qty 2

## 2014-04-11 MED ORDER — POTASSIUM CHLORIDE 20 MEQ/15ML (10%) PO SOLN
40.0000 meq | Freq: Two times a day (BID) | ORAL | Status: AC
  Administered 2014-04-11 (×2): 40 meq via ORAL
  Filled 2014-04-11 (×4): qty 30

## 2014-04-11 NOTE — Progress Notes (Signed)
NUTRITION FOLLOW-UP  INTERVENTION:  Needs bowel regimen, discussed with RN  Increase Vital High Protein to 34m/hr  632mProStat BID  Provides 1600 kcals (66% of needs), 165 grams protein, 1003 ml H20  NUTRITION DIAGNOSIS: Inadequate oral intake related to unable to eat as evidenced by NPO status; ongoing.   Goal: Enteral nutrition to provide 60-70% of estimated calorie needs (22-25 kcals/kg ideal body weight) and 100% of estimated protein needs, based on ASPEN guidelines for hypocaloric, high protein feeding in critically ill obese individuals; met.   Monitor:  TF tolerance, PO status, weight, labs    ASSESSMENT: [age]/o [place], CAD s/p STEMI 2002, was presented to ED via EMS with severe chest pain, hypertension and shortness of breath. He underwent emergency left and right heart catheterization and placement of a Impella CP ventricular assist device via femoral artery. Occluded prior stents have been replaced with new stents. His condition is slowly stabilizing. Prior to admission, pt had fairly good exercise tolerance and normal activities of daily living.   Patient is currently intubated on ventilator support MV: 11.8 L/min Temp (24hrs), Avg:100.2 F (37.9 C), Min:98.7 F (37.1 C), Max:101.7 F (38.7 C)  Phosphorus low.  Per MD respiratory status worsening due to MRSA PNA and ARDS. Was paralyzed overnight.  Per RN pt has not had a bm since admission, abd distended. Per RN he does have dulcolax and senokot syrup ordered prn.   Pt receiving Vital High Protein @ 25 ml/hr with 60 ml prostat QID via OG tube.  Tube feeding regimen provides 1400 kcal (57% of needs), 172 grams of protein, and 501 ml of H2O.   Height: Ht Readings from Last 1 Encounters:  04/11/14 _0  (1.803 m)    Weight: Wt Readings from Last 1 Encounters:  04/12/14 231 lb 4.2 oz (104.9 kg)  Admission weight: 218 lb (99.1 kg) 1/31  BMI:  Body mass index is 32.27  kg/(m^2). obese  Estimated Nutritional Needs: Kcal: 2435 Protein: 155-175 grams Fluid: 1.8 L daily  Skin: intact  Diet Order:     Intake/Output Summary (Last 24 hours) at 04/12/14 1451 Last data filed at 04/12/14 1400  Gross per 24 hour  Intake 4101.14 ml  Output   1705 ml  Net 2396.14 ml    Last BM: PTA  Labs:   Recent Labs Lab 04/09/14 0400 04/10/14 0459 04/11/14 0330 04/12/14 0345  NA 140 142 143 143  K 3.7 3.2* 3.6 4.3  CL 103 103 106 107  CO2 _1 BUN 40* 41* 53* 83*  CREATININE 1.06 1.07 1.18 1.62*  CALCIUM 7.5* 7.5* 7.6* 7.2*  MG 2.5 2.6* 2.8*  --   PHOS 4.1 3.1 2.2*  --   GLUCOSE 108* 127* 128* 156*    CBG (last 3)   Recent Labs  04/11/14 0741 04/11/14 1216 04/12/14 0514  GLUCAP 123* 152* 134*    Scheduled Meds: . antiseptic oral rinse  7 mL Mouth Rinse QID  . artificial tears  1 application Both Eyes 3 times per day  . aspirin  81 mg Per Tube Daily  . atorvastatin  80 mg Per Tube q1800  . chlorhexidine  15 mL Mouth Rinse BID  . digoxin  0.25 mg Intravenous Daily  . docusate  100 mg Oral BID  . feeding supplement (PRO-STAT SUGAR FREE 64)  60 mL Per Tube QID  . feeding supplement (VITAL HIGH PROTEIN)  1,000 mL Per Tube Q24H  .  heparin subcutaneous  5,000 Units Subcutaneous Q8H  . insulin aspart  0-15 Units Subcutaneous 6 times per day  . insulin glargine  35 Units Subcutaneous Daily  . ipratropium  0.5 mg Nebulization Q6H  . levalbuterol  0.63 mg Nebulization Q6H  . multivitamin  5 mL Per Tube Daily  . pantoprazole sodium  40 mg Per Tube Q24H  . sodium chloride  3 mL Intravenous Q12H  . ticagrelor  90 mg Per Tube BID  . vancomycin  750 mg Intravenous Q12H    Continuous Infusions: . sodium chloride 1,000 mL (03/17/2014 0700)  . amiodarone 30 mg/hr (04/12/14 1049)  . cisatracurium (NIMBEX) infusion 3 mcg/kg/min (04/12/14 1400)  . fentaNYL infusion INTRAVENOUS 400 mcg/hr (04/12/14 0847)  . midazolam (VERSED) infusion 10 mg/hr  (04/12/14 1320)  . norepinephrine (LEVOPHED) Adult infusion 15 mcg/min (04/12/14 1400)   [place], [place], [place] Pager [phone] After Hours Pager

## 2014-04-11 NOTE — Progress Notes (Signed)
PULMONARY / CRITICAL CARE MEDICINE   Name: Micheal Ayala MRN: [id number] DOB: [birth year]    ADMISSION DATE:  April 13, 2014 CONSULTATION DATE:  2014-04-13  REFERRING MD :  Dr. Herbie Baltimore  CHIEF COMPLAINT:  STEMI  INITIAL PRESENTATION:  59 yo smoker with STEMI, cardiogenic shock, VDRF.  He is visiting [place] from [place].  STUDIES:  1/31 Cardiac cath >> severe CAD with occlusion of LAD, circumflex, RCS, ischemic CM >> DES to LAD, circumflex 1/31 Echo >> LVEF 20-25%, severely reduced systolic function 2/04 LHC > DES RCA 2/06 Echo >> EF 25 to 30%, mod LVH, grade 2 diastolic dysfx  SIGNIFICANT EVENTS: 1/31 admit, cardiac cath, impella 2/01 TCTS consulted, transfuse 1 unit PRBC 2/05 impella removed; ?upper GI bleeding 2/08 increased respiratory secretions 2/10 ARDS protocol  SUBJECTIVE:  On increased PEEP/FiO2.  VITAL SIGNS: Temp:  [98.7 F (37.1 C)-101.7 F (38.7 C)] 99.4 F (37.4 C) (02/10 0400) Pulse Rate:  [97-121] 110 (02/10 0630) Resp:  [13-42] 23 (02/10 0630) BP: (81-134)/(52-89) 81/53 mmHg (02/10 0630) SpO2:  [88 %-100 %] 100 % (02/10 0755) FiO2 (%):  [60 %-85 %] 60 % (02/10 0755) Weight:  [227 lb 4.7 oz (103.1 kg)] 227 lb 4.7 oz (103.1 kg) (02/10 0500) HEMODYNAMICS: CVP:  [9 mmHg-13 mmHg] 13 mmHg VENTILATOR SETTINGS: Vent Mode:  [-] PRVC FiO2 (%):  [60 %-85 %] 60 % Set Rate:  [12 bmp] 12 bmp Vt Set:  [600 mL] 600 mL PEEP:  [8 cmH20] 8 cmH20 Plateau Pressure:  [24 cmH20-26 cmH20] 26 cmH20 INTAKE / OUTPUT:  Intake/Output Summary (Last 24 hours) at 04/11/14 1027 Last data filed at 04/11/14 0600  Gross per 24 hour  Intake 3150.94 ml  Output   1700 ml  Net 1450.94 ml   PHYSICAL EXAMINATION:  Gen: no distress HEENT: Rt eye enucleated, ETT in place PULM: scattered rhonchi, better air movement CV: irregular, tachycardic AB: BS+, soft, nontender Ext: no edema Neuro: RASS -2  LABS:  CBC  Recent Labs Lab 04/08/14 0440 04/09/14 0400 04/11/14 0330  WBC 11.2*  12.8* 14.6*  HGB 9.3* 10.0* 9.5*  HCT 28.5* 30.6* 30.3*  PLT 108* 197 390   Coag's  Recent Labs Lab 04/02/2014 0347 04/06/14 0305 04/07/14 0344  APTT 128* 126* 29   BMET  Recent Labs Lab 04/09/14 0400 04/10/14 0459 04/11/14 0330  NA 140 142 143  K 3.7 3.2* 3.6  CL 103 103 106  CO2 BUN 40* 41* 53*  CREATININE 1.06 1.07 1.18  GLUCOSE 108* 127* 128*   Electrolytes  Recent Labs Lab 04/09/14 0400 04/10/14 0459 04/11/14 0330  CALCIUM 7.5* 7.5* 7.6*  MG 2.5 2.6* 2.8*  PHOS 4.1 3.1 2.2*   ABG  Recent Labs Lab 04/28/2014 0354 04/06/14 0500 04/07/14 0330  PHART 7.466* 7.480* 7.416  PCO2ART 33.6* 31.0* 40.3  PO2ART 84.5 92.8 183.0*   Glucose  Recent Labs Lab 04/09/14 1958 04/10/14 1236 04/10/14 1553 04/10/14 2004 04/11/14 0007 04/11/14 0423  GLUCAP 165* 172* 127* 127* 153* 127*   ASSESSMENT / PLAN:  PULMONARY ETT 1/31>>> A:  Acute respiratory failure due to cardiogenic shock and pulmonary edema.  Developed MRSA HCAP 2/08. Tobacco abuse. P:   Full vent support >> might need trach if not able to extubate soon Change to ARDS protocol 2/10 Scheduled BD's >> changed to atrovent and xopenex in setting of tachycardia F/u CXR, ABG  CARDIOVASCULAR L Impella 1/31>>>2/5 Lt [place] CVL 2/08 >> A:  Cardiogenic shock post STEMI >> improving. SVT,  A fib with RVR. P: Amiodarone, milrinone, lasix, brilinta per cardiology Continue ASA, lipitor, digoxin, aldactone  RENAL A:  AKI >> resolved. Hypokalemia. P:   Monitor BMET and UOP Replace electrolytes as needed  GASTROINTESTINAL A:   Nutrition. Constipation. ?upper GI bleed 2/05 >> no further episodes since. P:   protonix BID Tube feeds Bowel regimen  HEMATOLOGIC A:  Thrombocytopenia >> likely from impella >> resolved. Anemia of critical illness. P:  F/u CBC  SQ heparin for DVT prevention  INFECTIOUS A: MRSA HCAP. P:    Day 3 vancomycin D/c zosyn  Blood 2/08 >> Sputum 2/08 >>  MRSA  ENDOCRINE A:   Hyperglycemia. P:   SSI with lantus  NEUROLOGIC A:  Acute encephalopathy 2nd to respiratory failure, cardiogenic shock. P:   RASS goal -1   CC time 35 minutes.  Coralyn HellingVineet Neymar Dowe, MD [facility] Pulmonary/Critical Care 04/11/2014, 10:27 AM Pager:  [phone] After 3pm call: [phone]

## 2014-04-11 NOTE — Procedures (Signed)
Arterial Catheter Insertion Procedure Note Job FoundsFrederick Cabreja [id number] 01/25/1956  Procedure: Insertion of Arterial Catheter  Indications: Blood pressure monitoring  Procedure Details Consent: Risks of procedure as well as the alternatives and risks of each were explained to the (patient/caregiver).  Consent for procedure obtained. Time Out: Verified patient identification, verified procedure, site/side was marked, verified correct patient position, special equipment/implants available, medications/allergies/relevent history reviewed, required imaging and test results available.  Performed  Maximum sterile technique was used including antiseptics, cap, gloves, gown, hand hygiene, mask and sheet. Skin prep: Chlorhexidine; local anesthetic administered 20 gauge catheter was inserted into left radial artery using the Seldinger technique.  Evaluation Blood flow good; BP tracing good. Complications: No apparent complications.   Harley HallmarkKeen, Anetta Olvera Lyman 04/11/2014
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
## 2014-04-11 NOTE — Progress Notes (Signed)
ANTIBIOTIC CONSULT NOTE - FOLLOW UP  Pharmacy Consult for Vancomycin and Zosyn Indication: rule out sepsis. MRSA HCAP  No Known Allergies  Patient Measurements: Height: 5\' 11"  (180.3 cm) Weight: 227 lb 4.7 oz (103.1 kg) IBW/kg (Calculated) : 75.3  Vital Signs: Temp: 99.5 F (37.5 C) (02/10 2000) Temp Source: Axillary (02/10 2000) BP: 110/66 mmHg (02/10 2200) Pulse Rate: 76 (02/10 2200) Intake/Output from previous day: 02/09 0701 - 02/10 0700 In: 4062.9 [P.O.:480; I.V.:1910.4; NG/GT:1110; IV Piggyback:562.5] Out: 1950 [Urine:1950] Intake/Output from this shift: Total I/O In: 321.9 [I.V.:96.9; NG/GT:25; IV Piggyback:200] Out: 450 [Urine:450]  Labs:  Recent Labs  04/09/14 0400 04/10/14 0459 04/11/14 0330  WBC 12.8*  --  14.6*  HGB 10.0*  --  9.5*  PLT 197  --  390  CREATININE 1.06 1.07 1.18   Estimated Creatinine Clearance: 83.4 mL/min (by C-G formula based on Cr of 1.18).  Recent Labs  04/11/14 1030 04/11/14 2000  VANCOTROUGH 11.5 19.3    Assessment:   Vancomycin trough level tonight is 19.3 mcg/ml.  At goal.   Vanc trough level from 10:30am today (11.5 mcg/ml) reported to be questionable, due to being collected in incorrect tube, and timed to have been drawn after am dose charted (9:41am).   MRSA in trach aspirate culture 04/09/14. MIC to Vanc < 0.5.   Blood cultures from 04/09/14 - no growth to date.  Goal of Therapy:  Vancomycin trough level 15-20 mcg/ml  Plan:   Continue Vancomcyin 1 gram IV q12hrs.  And Zosyn 3.375 gm IV q8hrs (each over 4 hours).  Follow renal function, culture data, progress.  Dennie FettersEgan, Nanci Lakatos Donovan, RPh Pager: [phone] 04/11/2014,10:10 PM

## 2014-04-11 NOTE — Progress Notes (Signed)
ANTIBIOTIC CONSULT NOTE   Pharmacy Consult for vancomycin and zosyn Indication: rule out sepsis  No Known Allergies  Patient Measurements: Height:  (180.3 cm) Weight: 227 lb 4.7 oz (103.1 kg) IBW/kg (Calculated) : 75.3   Vital Signs: Temp: 99.4 F (37.4 C) (02/10 0400) Temp Source: Axillary (02/10 0000) BP: 81/53 mmHg (02/10 0630) Pulse Rate: 110 (02/10 0630) Intake/Output from previous day: 02/09 0701 - 02/10 0700 In: 3855.3 [P.O.:480; I.V.:1727.8; NG/GT:1085; IV Piggyback:562.5] Out: 1950 [Urine:1950] Intake/Output from this shift:    Labs:  Recent Labs  04/09/14 0400 04/10/14 0459 04/11/14 0330  WBC 12.8*  --  14.6*  HGB 10.0*  --  9.5*  PLT 197  --  390  CREATININE 1.06 1.07 1.18   Estimated Creatinine Clearance: 83.4 mL/min (by C-G formula based on Cr of 1.18). No results for input(s): VANCOTROUGH, VANCOPEAK, VANCORANDOM, GENTTROUGH, GENTPEAK, GENTRANDOM, TOBRATROUGH, TOBRAPEAK, TOBRARND, AMIKACINPEAK, AMIKACINTROU, AMIKACIN in the last 72 hours.   Microbiology: Recent Results (from the past 720 hour(s))  MRSA PCR Screening     Status: None   Collection Time: 03/11/2014  8:01 AM  Result Value Ref Range Status   MRSA by PCR NEGATIVE NEGATIVE Final    Comment:        The GeneXpert MRSA Assay (FDA approved for NASAL specimens only), is one component of a comprehensive MRSA colonization surveillance program. It is not intended to diagnose MRSA infection nor to guide or monitor treatment for MRSA infections.   Culture, blood (routine x 2)     Status: None   Collection Time: 04/02/14  1:32 PM  Result Value Ref Range Status   Specimen Description BLOOD RIGHT HAND  Final   Special Requests BOTTLES DRAWN AEROBIC AND ANAEROBIC 10CC  Final   Culture   Final    NO GROWTH 5 DAYS Note: Culture results may be compromised due to an excessive volume of blood received in culture bottles. Performed at [facility]    Report Status 04/08/2014 FINAL   Final  Culture, blood (routine x 2)     Status: None   Collection Time: 04/02/14  2:12 PM  Result Value Ref Range Status   Specimen Description BLOOD RIGHT ARM  Final   Special Requests BOTTLES DRAWN AEROBIC AND ANAEROBIC 5CC  Final   Culture   Final    NO GROWTH 5 DAYS Performed at [facility]    Report Status 04/08/2014 FINAL  Final  Culture, respiratory (NON-Expectorated)     Status: None   Collection Time: 04/09/14  9:25 AM  Result Value Ref Range Status   Specimen Description TRACHEAL ASPIRATE  Final   Special Requests Normal  Final   Gram Stain   Final    MODERATE WBC PRESENT, PREDOMINANTLY PMN NO SQUAMOUS EPITHELIAL CELLS SEEN FEW GRAM POSITIVE COCCI IN PAIRS IN CLUSTERS Performed at [facility]    Culture   Final    ABUNDANT METHICILLIN RESISTANT STAPHYLOCOCCUS AUREUS Note: RIFAMPIN AND GENTAMICIN SHOULD NOT BE USED AS SINGLE DRUGS FOR TREATMENT OF STAPH INFECTIONS. CRITICAL RESULT CALLED TO, READ BACK BY AND VERIFIED WITH: ERIN S@7 :30AM ON 04/11/14 BY DANTS Performed at [facility]    Report Status 04/11/2014 FINAL  Final   Organism ID, Bacteria METHICILLIN RESISTANT STAPHYLOCOCCUS AUREUS  Final      Susceptibility   Methicillin resistant staphylococcus aureus - MIC*    CLINDAMYCIN >=8 RESISTANT Resistant     ERYTHROMYCIN >=8 RESISTANT Resistant     GENTAMICIN <=0.5 SENSITIVE  Sensitive     LEVOFLOXACIN >=8 RESISTANT Resistant     OXACILLIN >=4 RESISTANT Resistant     PENICILLIN >=0.5 RESISTANT Resistant     RIFAMPIN <=0.5 SENSITIVE Sensitive     TRIMETH/SULFA <=10 SENSITIVE Sensitive     VANCOMYCIN <=0.5 SENSITIVE Sensitive     TETRACYCLINE <=1 SENSITIVE Sensitive     * ABUNDANT METHICILLIN RESISTANT STAPHYLOCOCCUS AUREUS  Culture, blood (routine x 2)     Status: None (Preliminary result)   Collection Time: 04/09/14 11:23 AM  Result Value Ref Range Status   Specimen Description BLOOD LEFT HAND  Final   Special Requests BOTTLES DRAWN  AEROBIC AND ANAEROBIC 10CC  Final   Culture   Final           BLOOD CULTURE RECEIVED NO GROWTH TO DATE CULTURE WILL BE HELD FOR 5 DAYS BEFORE ISSUING A FINAL NEGATIVE REPORT [facility]    Report Status PENDING  Incomplete  Culture, blood (routine x 2)     Status: None (Preliminary result)   Collection Time: 04/09/14 11:35 AM  Result Value Ref Range Status   Specimen Description BLOOD RIGHT ARM  Final   Special Requests BOTTLES DRAWN AEROBIC ONLY 6CC  Final   Culture   Final           BLOOD CULTURE RECEIVED NO GROWTH TO DATE CULTURE WILL BE HELD FOR 5 DAYS BEFORE ISSUING A FINAL NEGATIVE REPORT [facility]formed at [facility]    Report Status PENDING  Incomplete    Medical History: Past Medical History  Diagnosis Date  . MI (myocardial infarction)     Assessment: 59 year old male with low grade temp and slightly elevated wbc, possibly developing PNA/sepsis from existing lines now with MRSA pneumonia. Patient continues on vancomycin and zosyn. Renal function is normal, although scr is trending up. Tmax 101.7, wbc up to 14 this am.  2/1 Vanc>> 2/3, 2/8>> 2/8 zosyn >> MRSA PCR negative 2/1 bld x2- ng 2/8 bld x2 - ngtd 2/8 Trach asp -MRSA  Vancomycin level missed this morning, will check prior to next dose tonight  Goal of Therapy:  Vancomycin trough level 15-20 mcg/ml  Plan:  Vancomycin level tonight Follow up culture results Vancomycin 1g q12 hours  Zosyn 3.375g IV q8 hours  Sheppard CoilFrank Wilson PharmD., BCPS Clinical Pharmacist Pager [phone] 04/11/2014 10:31 AM

## 2014-04-11 NOTE — Progress Notes (Signed)
Patient ID: Job FoundsFrederick Walski, male   DOB: 02/18/1956, 59 y.o.   MRN: [id number]  Advanced Heart Failure Rounding Note   Subjective:    Remains on vent. Awake but agitated.   Impella removed 2/5.  Off norepinephrine 2/5.   Echo (2/7) with EF 25-30%, peri-apical akinesis, no LV thrombus noted, normal RV.   Was febrile with copious secretions yesterday. Lines removed. Started on broad spectrum abx for presumed VAP. Sputum cx with MRSA.  Remains on 100% FiO2. Had several runs AF treated with iv amio - now quiescent. CVP 12. Co-ox 71%   Objective:   Weight Range:  Vital Signs:   Temp:  [98.7 F (37.1 C)-101.7 F (38.7 C)] 101 F (38.3 C) (02/10 0800) Pulse Rate:  [102-121] 111 (02/10 1000) Resp:  [18-42] 23 (02/10 1000) BP: (81-113)/(52-80) 102/72 mmHg (02/10 1000) SpO2:  [91 %-100 %] 97 % (02/10 1330) FiO2 (%):  [60 %-80 %] 60 % (02/10 1330) Weight:  [227 lb 4.7 oz (103.1 kg)] 227 lb 4.7 oz (103.1 kg) (02/10 0500) Last BM Date:  (PTA)  Weight change: Filed Weights   04/09/14 0348 04/10/14 0243 04/11/14 0500  Weight: 223 lb 8.7 oz (101.4 kg) 219 lb 9.3 oz (99.6 kg) 227 lb 4.7 oz (103.1 kg)    Intake/Output:   Intake/Output Summary (Last 24 hours) at 04/11/14 1335 Last data filed at 04/11/14 0600  Gross per 24 hour  Intake 2517.44 ml  Output   1500 ml  Net 1017.44 ml     Physical Exam: General:  Intubated. 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  JVP flat Cor: PMI laterally displaced. Regular tachy  +s3 Lungs: clear Abdomen: soft, nontender, nondistended. No hepatosplenomegaly. No bruits or masses. Hypoactive bowel sounds. Extremities: no cyanosis, clubbing, rash, no edema. Neuro: awake on vent. agitated  Telemetry: Sinus tach 100  Labs: Basic Metabolic Panel:  Recent Labs Lab 04/04/14 1914  04/06/14 0305 04/07/14 0344 04/08/14 0440 04/09/14 0400 04/10/14 0459  04/11/14 0330  NA 134*  < > 133* 135 138 140 142 143  K 3.3*  < > 3.5 3.4* 3.7 3.7 3.2* 3.6  CL 105  < > 106 104 105 103 103 106  CO2 22  < > 27 24 26 31 28 29   GLUCOSE 203*  < > 150* 124* 115* 108* 127* 128*  BUN 30*  < > 26* 30* 37* 40* 41* 53*  CREATININE 0.90  < > 0.86 0.86 0.93 1.06 1.07 1.18  CALCIUM 7.5*  < > 7.4* 7.4* 7.7* 7.5* 7.5* 7.6*  MG  --   --  2.0  --  2.4 2.5 2.6* 2.8*  PHOS 1.7*  --   --   --  4.2 4.1 3.1 2.2*  < > = values in this interval not displayed.  Liver Function Tests:  Recent Labs Lab 04/04/14 1914 2014-12-03 0347 04/06/14 0305 04/09/14 0400  AST  --  95* 79* 100*  ALT  --  80* 70* 148*  ALKPHOS  --  138* 143* 288*  BILITOT  --  2.3* 1.8* 3.1*  PROT  --  5.1* 5.0* 6.1  ALBUMIN 2.0* 1.9* 1.8* 2.0*   No results for input(s): LIPASE, AMYLASE in the last 168 hours. No results for input(s): AMMONIA in the last 168 hours.  CBC:  Recent Labs Lab 2014-12-03 0347 04/06/14 0305 04/06/14 2155 04/07/14 0344 04/08/14 0440 04/09/14 0400 04/11/14 0330  WBC  12.8* 12.5* 10.8* 11.2* 11.2* 12.8* 14.6*  NEUTROABS 9.2* 8.7*  --  7.8* 8.2* 9.9*  --   HGB 10.3* 9.5* 9.4* 9.7* 9.3* 10.0* 9.5*  HCT 29.7* 28.1* 27.6* 29.3* 28.5* 30.6* 30.3*  MCV 86.1 87.5 87.9 90.2 89.3 90.3 91.3  PLT 87* 74* 79* 86* 108* 197 390    Cardiac Enzymes: No results for input(s): CKTOTAL, CKMB, CKMBINDEX, TROPONINI in the last 168 hours.  BNP: BNP (last 3 results) No results for input(s): PROBNP in the last 8760 hours.   Other results:    Imaging: Dg Chest Port 1 View  04/11/2014   CLINICAL DATA:  Pneumonia.  EXAM: PORTABLE CHEST - 1 VIEW  COMPARISON:  04/09/2014.  FINDINGS: Endotracheal tube, NG tube, left central line in stable position. Persistent cardiomegaly. Progressive bilateral pulmonary alveolar infiltrates consistent with progressive pulmonary edema, ARDS, or bilateral pneumonia. Small mild pleural effusions. Right costophrenic angle not imaged. No pneumothorax.   IMPRESSION: 1. Lines and tubes in stable position. 2. Persistent cardiomegaly. 3. Progressive bilateral pulmonary alveolar infiltrates consistent with pulmonary edema, ARDS, and/or pneumonia. Bilateral small pleural effusions are present.   Electronically Signed   By: Maisie Fus  Register   On: 04/11/2014 07:20     Medications:     Scheduled Medications: . antiseptic oral rinse  7 mL Mouth Rinse QID  . aspirin  81 mg Per Tube Daily  . atorvastatin  80 mg Per Tube q1800  . chlorhexidine  15 mL Mouth Rinse BID  . digoxin  0.25 mg Intravenous Daily  . docusate  100 mg Oral BID  . feeding supplement (PRO-STAT SUGAR FREE 64)  60 mL Per Tube QID  . feeding supplement (VITAL HIGH PROTEIN)  1,000 mL Per Tube Q24H  . furosemide  40 mg Intravenous Daily  . guaiFENesin  10 mL Per Tube 4 times per day  . heparin subcutaneous  5,000 Units Subcutaneous Q8H  . insulin aspart  0-15 Units Subcutaneous 6 times per day  . insulin glargine  35 Units Subcutaneous Daily  . ipratropium  0.5 mg Nebulization Q6H  . levalbuterol  0.63 mg Nebulization Q6H  . multivitamin  5 mL Per Tube Daily  . pantoprazole sodium  40 mg Per Tube Q24H  . potassium chloride  40 mEq Oral BID  . sodium chloride  3 mL Intravenous Q12H  . spironolactone  12.5 mg Oral Daily  . ticagrelor  90 mg Per Tube BID  . vancomycin  1,000 mg Intravenous Q12H    Infusions: . sodium chloride 1,000 mL (03/14/2014 0700)  . amiodarone 60 mg/hr (04/11/14 0939)  . fentaNYL infusion INTRAVENOUS 275 mcg/hr (04/11/14 0930)  . midazolam (VERSED) infusion 8 mg/hr (04/11/14 1252)  . milrinone 0.25 mcg/kg/min (04/11/14 0941)    PRN Medications: sodium chloride, sodium chloride, Place/Maintain arterial line **AND** sodium chloride, acetaminophen, bisacodyl, fentaNYL, Influenza vac split quadrivalent PF, levalbuterol, midazolam, ondansetron (ZOFRAN) IV, pneumococcal 23 valent vaccine, sennosides, sodium chloride   Assessment:   1. Cardiogenic shock     --s/p Impella placement 1/31, Impella out 2/5.  2. Acute on chronic systolic HF    --EF 10% on echo    --Repeat echo (2/7) with EF 25-30%, peri-apical akinesis, no LV thrombus, normal RV 3. Anterolateral STEMI 1/31   --due to stent occlusion   --s/p PCI with DES to LAD and OM-2 on 1/31   --s/p PCI with DES to RCA 2/5 4. VDRF 5. CAD 6. Tobacco abuse ongoing 7. H/o traumatic brain injury    --  s/p R eye enucleation 8. Hypomagnesemia/hypnatremia 9. Shock liver 10. Anemia s/p 3u RBCs 11. SVT: Amiodarone started 12. Fever 13. MRSA Pneumonia, hospital acquired   Plan/Discussion:    From a cardiac perspective relatively stable. Echo on 2/7 showed some improvement in LV systolic function, EF 25-30%.  Co-ox ok. Wean milrinone to 0.125. Continue daily lasix as tolerated.   Now with MRSA pneumonia. Vanc/zosyn started 2/8.  P/CCM managing. May need trach. Can add back norepi for BP support as needed.   Continue Brilinta, ASA, statin for CAD.  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 Will start coumadin once decision regarding trach made.   Continue amiodarone for SVT/AF.   The patient is critically ill with multiple organ systems failure and requires high complexity decision making for assessment and support, frequent evaluation and titration of therapies, application of advanced monitoring technologies and extensive interpretation of multiple databases.   Critical Care Time devoted to patient care services described in this note is 35 Minutes.   Length of Stay: 10   Arvilla Meres MD 04/11/2014, 1:35 PM  Advanced Heart Failure Team Pager [phone] (M-F; 7a - 4p)  Please contact [facility] for night-coverage after hours (4p -7a ) and weekends on [email or web address]

## 2014-04-12 ENCOUNTER — Inpatient Hospital Stay (HOSPITAL_COMMUNITY): Payer: Medicare Other

## 2014-04-12 DIAGNOSIS — R6521 Severe sepsis with septic shock: Secondary | ICD-10-CM

## 2014-04-12 DIAGNOSIS — A419 Sepsis, unspecified organism: Secondary | ICD-10-CM

## 2014-04-12 LAB — BLOOD GAS, ARTERIAL
Acid-Base Excess: 1.8 mmol/L (ref 0.0–2.0)
Acid-Base Excess: 2.3 mmol/L — ABNORMAL HIGH (ref 0.0–2.0)
BICARBONATE: 26.7 meq/L — AB (ref 20.0–24.0)
BICARBONATE: 27.5 meq/L — AB (ref 20.0–24.0)
DRAWN BY: 347621
Drawn by: 34762
FIO2: 0.7 %
FIO2: 1 %
MECHVT: 450 mL
MECHVT: 450 mL
O2 Saturation: 73.9 %
O2 Saturation: 95 %
PEEP: 14 cmH2O
PEEP: 20 cmH2O
PH ART: 7.361 (ref 7.350–7.450)
PH ART: 7.337 — AB (ref 7.350–7.450)
PO2 ART: 47 mmHg — AB (ref 80.0–100.0)
Patient temperature: 98.6
Patient temperature: 98.6
RATE: 28 resp/min
RATE: 28 resp/min
TCO2: 28.2 mmol/L (ref 0–100)
TCO2: 29.1 mmol/L (ref 0–100)
pCO2 arterial: 48.4 mmHg — ABNORMAL HIGH (ref 35.0–45.0)
pCO2 arterial: 52.7 mmHg — ABNORMAL HIGH (ref 35.0–45.0)
pO2, Arterial: 88.6 mmHg (ref 80.0–100.0)

## 2014-04-12 LAB — LACTIC ACID, PLASMA
LACTIC ACID, VENOUS: 1.4 mmol/L (ref 0.5–2.0)
LACTIC ACID, VENOUS: 1.6 mmol/L (ref 0.5–2.0)

## 2014-04-12 LAB — CARBOXYHEMOGLOBIN
Carboxyhemoglobin: 1.3 % (ref 0.5–1.5)
Methemoglobin: 0.8 % (ref 0.0–1.5)
O2 Saturation: 84 %
Total hemoglobin: 10.1 g/dL — ABNORMAL LOW (ref 13.5–18.0)

## 2014-04-12 LAB — BASIC METABOLIC PANEL
Anion gap: 7 (ref 5–15)
BUN: 83 mg/dL — AB (ref 6–23)
CALCIUM: 7.2 mg/dL — AB (ref 8.4–10.5)
CHLORIDE: 107 mmol/L (ref 96–112)
CO2: 29 mmol/L (ref 19–32)
CREATININE: 1.62 mg/dL — AB (ref 0.50–1.35)
GFR calc Af Amer: 52 mL/min — ABNORMAL LOW (ref >90)
GFR calc non Af Amer: 45 mL/min — ABNORMAL LOW (ref >90)
GLUCOSE: 156 mg/dL — AB (ref 70–99)
Potassium: 4.3 mmol/L (ref 3.5–5.1)
Sodium: 143 mmol/L (ref 135–145)

## 2014-04-12 LAB — PROCALCITONIN: Procalcitonin: 1.15 ng/mL

## 2014-04-12 LAB — CBC
HCT: 31.4 % — ABNORMAL LOW (ref 39.0–52.0)
Hemoglobin: 9.6 g/dL — ABNORMAL LOW (ref 13.0–17.0)
MCH: 28.2 pg (ref 26.0–34.0)
MCHC: 30.6 g/dL (ref 30.0–36.0)
MCV: 92.1 fL (ref 78.0–100.0)
Platelets: 542 10*3/uL — ABNORMAL HIGH (ref 150–400)
RBC: 3.41 MIL/uL — AB (ref 4.22–5.81)
RDW: 16.5 % — ABNORMAL HIGH (ref 11.5–15.5)
WBC: 18.3 10*3/uL — AB (ref 4.0–10.5)

## 2014-04-12 LAB — GLUCOSE, CAPILLARY
GLUCOSE-CAPILLARY: 134 mg/dL — AB (ref 70–99)
Glucose-Capillary: 133 mg/dL — ABNORMAL HIGH (ref 70–99)

## 2014-04-12 MED ORDER — VITAL HIGH PROTEIN PO LIQD
1000.0000 mL | ORAL | Status: DC
  Administered 2014-04-12 – 2014-04-13 (×2): 1000 mL
  Administered 2014-04-14: 17:00:00
  Administered 2014-04-14: 1000 mL
  Administered 2014-04-14: 18:00:00
  Administered 2014-04-15 – 2014-04-18 (×4): 1000 mL
  Administered 2014-04-18 – 2014-04-19 (×2)
  Filled 2014-04-12 (×9): qty 1000

## 2014-04-12 MED ORDER — SODIUM CHLORIDE 0.9 % IV SOLN
3.0000 ug/kg/min | INTRAVENOUS | Status: DC
  Administered 2014-04-12 (×2): 3 ug/kg/min via INTRAVENOUS
  Administered 2014-04-13: 2 ug/kg/min via INTRAVENOUS
  Administered 2014-04-14: 3 ug/kg/min via INTRAVENOUS
  Administered 2014-04-14: 2 ug/kg/min via INTRAVENOUS
  Administered 2014-04-15 (×2): 4 ug/kg/min via INTRAVENOUS
  Administered 2014-04-16: 3 ug/kg/min via INTRAVENOUS
  Administered 2014-04-16: 5 ug/kg/min via INTRAVENOUS
  Administered 2014-04-17: 4.5 ug/kg/min via INTRAVENOUS
  Filled 2014-04-12 (×12): qty 20

## 2014-04-12 MED ORDER — ARTIFICIAL TEARS OP OINT
1.0000 "application " | TOPICAL_OINTMENT | Freq: Three times a day (TID) | OPHTHALMIC | Status: DC
  Administered 2014-04-12 – 2014-04-18 (×18): 1 via OPHTHALMIC
  Filled 2014-04-12 (×3): qty 3.5

## 2014-04-12 MED ORDER — CISATRACURIUM BOLUS VIA INFUSION
10.0000 mg | Freq: Once | INTRAVENOUS | Status: AC
  Administered 2014-04-12: 10 mg via INTRAVENOUS
  Filled 2014-04-12: qty 10

## 2014-04-12 MED ORDER — PRO-STAT SUGAR FREE PO LIQD
60.0000 mL | Freq: Two times a day (BID) | ORAL | Status: DC
  Administered 2014-04-12 – 2014-04-19 (×13): 60 mL
  Filled 2014-04-12 (×15): qty 60

## 2014-04-12 MED ORDER — VANCOMYCIN HCL IN DEXTROSE 750-5 MG/150ML-% IV SOLN
750.0000 mg | Freq: Two times a day (BID) | INTRAVENOUS | Status: DC
  Administered 2014-04-12 – 2014-04-13 (×2): 750 mg via INTRAVENOUS
  Filled 2014-04-12 (×3): qty 150

## 2014-04-12 MED ORDER — FUROSEMIDE 10 MG/ML IJ SOLN
60.0000 mg | Freq: Once | INTRAMUSCULAR | Status: AC
  Administered 2014-04-12: 60 mg via INTRAVENOUS
  Filled 2014-04-12: qty 6

## 2014-04-12 NOTE — Progress Notes (Signed)
Pt's SpO2 saturations began to drop to the mid/lower 80's suddenly, requiring frequent increases in Peep and FiO2 by respiratory therapy with no improvement. Lung sounds present bilaterally but course. Dr. Delton CoombesByrum notified. ABG drawn and stat chest xray ordered. Joneen RoachPaul Hoffman NP at the bedside. Will continue to monitor.

## 2014-04-12 NOTE — Progress Notes (Addendum)
[facility] PCCM PROGRESS NOTE  S:  Called to bedside by staff RT to assess patient for hypoxemia. The patient has been inpatient for some time, initially presenting with STEMI requiring urgent cath lab. Was on Impella for some time. Now off. Tonight he started having desaturations into the 70% range. He has recently changed to ARDS protocol by rounding team, so RT was able to adjust ventilator according to that protocol. Upon my arrival he was on 80% FiO2 and 12 of PEEP. O2 sats were in the 70% range.  O: BP 123/71 mmHg  Pulse 102  Temp(Src) 98.7 F (37.1 C) (Axillary)  Resp 40  Ht 5\' 11"  (1.803 m)  Wt 103.1 kg (227 lb 4.7 oz)  BMI 31.72 kg/m2  SpO2 89%  General:  Overweight male on vent Neuro:  Deeply sedated on vent HEENT:  [place]/AT, PERRL, no JVD noted Cardiovascular:  Tachy, regular. Lungs:  Diffusely coarse bilateral breath sounds. Abdomen:  Soft, non-distended Musculoskeletal:  No acute deformity Skin:  Intact, MMM  CXR: progressive bilateral pulmonary infiltrates, edema vs PNA. ARDS appearance  Arterial Blood Gas result:  pO2 48; pCO2 48.4; pH 7.36;  HCO3 26.7, %O2 Sat 73.9.  A/P: ARDS 2nd to PNA, pulmonary edema - Continue ARDS protocol, maximize support. - Initiate neuromuscular blockade per protocol, with BIS and TO4. - Follow ABG, CXR. - Recruitment maneuvers per RT. - If no improvement with these changes may need to consider prone positioning. - Called primary cardiology team, recommend 60mg  IV Lasix one time dose now.    Joneen RoachPaul Keyri Salberg, ACNP [facility]/Critical Care Pager [phone] or [phone]

## 2014-04-12 NOTE — Progress Notes (Signed)
PULMONARY / CRITICAL CARE MEDICINE   Name: Micheal Ayala MRN: [id number] DOB: 06/25/1955    ADMISSION DATE:  03/12/2014 CONSULTATION DATE:  03/23/2014  REFERRING MD :  Dr. Herbie BaltimoreHarding  CHIEF COMPLAINT:  STEMI  INITIAL PRESENTATION:  59 yo smoker with STEMI, cardiogenic shock, VDRF.  He is visiting [place] from [place].  STUDIES:  1/31 Cardiac cath >> severe CAD with occlusion of LAD, circumflex, RCS, ischemic CM >> DES to LAD, circumflex 1/31 Echo >> LVEF 20-25%, severely reduced systolic function 2/04 LHC > DES RCA 2/06 Echo >> EF 25 to 30%, mod LVH, grade 2 diastolic dysfx  SIGNIFICANT EVENTS: 1/31 admit, cardiac cath, impella 2/01 TCTS consulted, transfuse 1 unit PRBC 2/05 impella removed; ?upper GI bleeding 2/08 increased respiratory secretions 2/10 ARDS protocol, added levophed 2/11 start paralytic  SUBJECTIVE:  Started on pressors, paralytic overnight.  VITAL SIGNS: Temp:  [98.7 F (37.1 C)-101.7 F (38.7 C)] 99.4 F (37.4 C) (02/11 0800) Pulse Rate:  [76-117] 103 (02/11 0812) Resp:  [17-40] 28 (02/11 0812) BP: (63-125)/(15-84) 102/55 mmHg (02/11 0812) SpO2:  [85 %-100 %] 100 % (02/11 0812) Arterial Line BP: (85-129)/(44-62) 104/57 mmHg (02/11 0800) FiO2 (%):  [50 %-100 %] 90 % (02/11 0815) Weight:  [231 lb 4.2 oz (104.9 kg)] 231 lb 4.2 oz (104.9 kg) (02/11 0600) HEMODYNAMICS: CVP:  [9 mmHg-25 mmHg] 12 mmHg VENTILATOR SETTINGS: Vent Mode:  [-] PRVC FiO2 (%):  [50 %-100 %] 90 % Set Rate:  [28 bmp-34 bmp] 28 bmp Vt Set:  [450 mL-500 mL] 450 mL PEEP:  [8 cmH20-20 cmH20] 16 cmH20 Plateau Pressure:  [23 cmH20-35 cmH20] 34 cmH20 INTAKE / OUTPUT:  Intake/Output Summary (Last 24 hours) at 04/12/14 0920 Last data filed at 04/12/14 0530  Gross per 24 hour  Intake 3882.59 ml  Output   2245 ml  Net 1637.59 ml   PHYSICAL EXAMINATION:  Gen: paralyzed HEENT: Rt eye enucleated, ETT in place PULM: b/l rhonchi/rales CV: irregular, tachycardic AB: decreased bowel sounds,  soft Ext: no edema Neuro: RASS -5  LABS:  CBC  Recent Labs Lab 04/09/14 0400 04/11/14 0330 04/12/14 0345  WBC 12.8* 14.6* 18.3*  HGB 10.0* 9.5* 9.6*  HCT 30.6* 30.3* 31.4*  PLT 197 390 542*   Coag's  Recent Labs Lab 04/06/14 0305 04/07/14 0344  APTT 126* 29   BMET  Recent Labs Lab 04/10/14 0459 04/11/14 0330 04/12/14 0345  NA 142 143 143  K 3.2* 3.6 4.3  CL 103 106 107  CO2 28 29 29   BUN 41* 53* 83*  CREATININE 1.07 1.18 1.62*  GLUCOSE 127* 128* 156*   Electrolytes  Recent Labs Lab 04/09/14 0400 04/10/14 0459 04/11/14 0330 04/12/14 0345  CALCIUM 7.5* 7.5* 7.6* 7.2*  MG 2.5 2.6* 2.8*  --   PHOS 4.1 3.1 2.2*  --    ABG  Recent Labs Lab 04/11/14 1332 04/12/14 0115 04/12/14 0425  PHART 7.454* 7.361 7.337*  PCO2ART 37.8 48.4* 52.7*  PO2ART 65.0* 47.0* 88.6   Glucose  Recent Labs Lab 04/10/14 2004 04/11/14 0007 04/11/14 0423 04/11/14 0741 04/11/14 1216 04/12/14 0514  GLUCAP 127* 153* 127* 123* 152* 134*   ASSESSMENT / PLAN:  PULMONARY ETT 1/31>>> A:  Acute respiratory failure due to cardiogenic shock and pulmonary edema.  Developed MRSA HCAP 2/08 with ARDS. Tobacco abuse. P:   Full vent support >> might need trach if not able to extubate soon Changed to ARDS protocol 2/10 Scheduled BD's >> changed to atrovent and xopenex in setting of  tachycardia F/u CXR, ABG  CARDIOVASCULAR L Impella 1/31>>>2/5 Lt [place] CVL 2/08 >> A:  Cardiogenic shock post STEMI on admission. Septic shock developed from MRSA PNA 2/10. SVT, A fib with RVR. P: Amiodarone, milrinone, brilinta per cardiology Hold lasix for now Continue ASA, lipitor, digoxin, aldactone per cardiology  RENAL A:  AKI in setting of cardiogenic/septic shock. Hypokalemia. P:   Monitor BMET and UOP Replace electrolytes as needed  GASTROINTESTINAL A:   Nutrition. Constipation. ?upper GI bleed 2/05 >> no further episodes since. P:   protonix BID Bowel  regimen  HEMATOLOGIC A:  Thrombocytopenia >> likely from impella >> resolved. Anemia of critical illness. P:  F/u CBC  SQ heparin for DVT prevention  INFECTIOUS A: MRSA HCAP. P:    Day 3 vancomycin Check procalcitonin, lactic acid  Blood 2/08 >> Sputum 2/08 >> MRSA  ENDOCRINE A:   Hyperglycemia. P:   SSI with lantus  NEUROLOGIC A:  Acute encephalopathy 2nd to respiratory failure, cardiogenic shock. P:   RASS goal -5 while on nimbex  Summary: Initial presentation with STEMI, cardiogenic shock, ischemic CM, pulmonary edema, VDRF.  Now complicated by MRSA PNA with ARDS and septic shock.  CC time 35 minutes.  [name], MD [facility] Pulmonary/Critical Care 04/12/2014, 9:20 AM Pager:  [phone] After 3pm call: [phone]

## 2014-04-12 NOTE — Progress Notes (Signed)
Recruitment maneuver performed. Patient currently on 70%  Peep 14.  Spo2 85%

## 2014-04-12 NOTE — Progress Notes (Signed)
eLink Physician-Brief Progress Note Patient Name: Micheal Ayala DOB: 01/11/1956 MRN: [id number]   Date of Service  04/12/2014  HPI/Events of Note  Acute hypoxemia, increasing PEEP and Fio2 requirements. ABG pending   eICU Interventions  STAT CXR ordered     Intervention Category Major Interventions: Hypoxemia - evaluation and management  Micheal Brickhouse S. 04/12/2014, 1:09 AM

## 2014-04-12 NOTE — Progress Notes (Signed)
Patient ID: Micheal Ayala, male   DOB: 10/17/1955, 59 y.o.   MRN: [id number]  Advanced Heart Failure Rounding Note   Subjective:    Remains on vent. Awake but agitated.   Impella removed 2/5.  Off norepinephrine 2/5.   Echo (2/7) with EF 25-30%, peri-apical akinesis, no LV thrombus noted, normal RV.   Respiratory status continues to worsen in setting of severe MRSA PNA and ARDS. Had to be paralyzed overnight. Now on norepi 23 mcg.   CXR with diffuse infiltrates . Cr up to 1.6    Objective:   Weight Range:  Vital Signs:   Temp:  [98.7 F (37.1 C)-101.7 F (38.7 C)] 99.4 F (37.4 C) (02/11 0800) Pulse Rate:  [76-117] 103 (02/11 0812) Resp:  [17-40] 28 (02/11 0812) BP: (63-125)/(15-84) 89/62 mmHg (02/11 0900) SpO2:  [85 %-100 %] 100 % (02/11 0812) Arterial Line BP: (85-129)/(44-62) 104/57 mmHg (02/11 0800) FiO2 (%):  [50 %-100 %] 90 % (02/11 0815) Weight:  [104.9 kg (231 lb 4.2 oz)] 104.9 kg (231 lb 4.2 oz) (02/11 0600) Last BM Date:  (PTA)  Weight change: Filed Weights   04/10/14 0243 04/11/14 0500 04/12/14 0600  Weight: 99.6 kg (219 lb 9.3 oz) 103.1 kg (227 lb 4.7 oz) 104.9 kg (231 lb 4.2 oz)    Intake/Output:   Intake/Output Summary (Last 24 hours) at 04/12/14 0956 Last data filed at 04/12/14 0530  Gross per 24 hour  Intake 3682.59 ml  Output   2245 ml  Net 1437.59 ml     Physical Exam: General:  Intubated. Paralyzed HEENT: normal except for R eye enucleation  OGT with dark drainage Neck: supple.Carotids 2+ bilat; no bruits.  JVP flat Cor: PMI laterally displaced. Regular tachy  +s3 Lungs: rhonchi Abdomen: soft, nontender, nondistended. No hepatosplenomegaly. No bruits or masses. Hypoactive bowel sounds. Extremities: no cyanosis, clubbing, rash, no edema. Neuro: awake on vent. agitated  Telemetry: Sinus tach 100  Labs: Basic Metabolic Panel:  Recent Labs Lab 04/06/14 0305  04/08/14 0440 04/09/14 0400 04/10/14 0459 04/11/14 0330 04/12/14 0345   NA 133*  < > 138 140 142 143 143  K 3.5  < > 3.7 3.7 3.2* 3.6 4.3  CL 106  < > 105 103 103 106 107  CO2 27  < > 26 31 28 29 29   GLUCOSE 150*  < > 115* 108* 127* 128* 156*  BUN 26*  < > 37* 40* 41* 53* 83*  CREATININE 0.86  < > 0.93 1.06 1.07 1.18 1.62*  CALCIUM 7.4*  < > 7.7* 7.5* 7.5* 7.6* 7.2*  MG 2.0  --  2.4 2.5 2.6* 2.8*  --   PHOS  --   --  4.2 4.1 3.1 2.2*  --   < > = values in this interval not displayed.  Liver Function Tests:  Recent Labs Lab 04/06/14 0305 04/09/14 0400  AST 79* 100*  ALT 70* 148*  ALKPHOS 143* 288*  BILITOT 1.8* 3.1*  PROT 5.0* 6.1  ALBUMIN 1.8* 2.0*   No results for input(s): LIPASE, AMYLASE in the last 168 hours. No results for input(s): AMMONIA in the last 168 hours.  CBC:  Recent Labs Lab 04/06/14 0305  04/07/14 0344 04/08/14 0440 04/09/14 0400 04/11/14 0330 04/12/14 0345  WBC 12.5*  < > 11.2* 11.2* 12.8* 14.6* 18.3*  NEUTROABS 8.7*  --  7.8* 8.2* 9.9*  --   --   HGB 9.5*  < > 9.7* 9.3* 10.0* 9.5* 9.6*  HCT 28.1*  < >  29.3* 28.5* 30.6* 30.3* 31.4*  MCV 87.5  < > 90.2 89.3 90.3 91.3 92.1  PLT 74*  < > 86* 108* 197 390 542*  < > = values in this interval not displayed.  Cardiac Enzymes: No results for input(s): CKTOTAL, CKMB, CKMBINDEX, TROPONINI in the last 168 hours.  BNP: BNP (last 3 results) No results for input(s): PROBNP in the last 8760 hours.   Other results:    Imaging: Dg Chest Port 1 View  04/12/2014   CLINICAL DATA:  Hypoxia.  EXAM: PORTABLE CHEST - 1 VIEW  COMPARISON:  04/11/2014  FINDINGS: Endotracheal tube tip measures 4.8 cm above the carina. Enteric tube tip is off the field of view but below the left hemidiaphragm. Left central venous catheter tip overlies the mid SVC region. No significant change in appearance of the pulmonary appliances. Cardiac enlargement. Bilateral perihilar consolidation again demonstrated. Hazy opacities over the costophrenic angle suggests small effusions. No pneumothorax.   IMPRESSION: Appliances appear in satisfactory position. Persistent bilateral perihilar consolidation. Probable small bilateral pleural effusions.   Electronically Signed   By: Burman Nieves M.D.   On: 04/12/2014 01:44   Dg Chest Port 1 View  04/11/2014   CLINICAL DATA:  Pneumonia.  EXAM: PORTABLE CHEST - 1 VIEW  COMPARISON:  04/09/2014.  FINDINGS: Endotracheal tube, NG tube, left central line in stable position. Persistent cardiomegaly. Progressive bilateral pulmonary alveolar infiltrates consistent with progressive pulmonary edema, ARDS, or bilateral pneumonia. Small mild pleural effusions. Right costophrenic angle not imaged. No pneumothorax.  IMPRESSION: 1. Lines and tubes in stable position. 2. Persistent cardiomegaly. 3. Progressive bilateral pulmonary alveolar infiltrates consistent with pulmonary edema, ARDS, and/or pneumonia. Bilateral small pleural effusions are present.   Electronically Signed   By: Maisie Fus  Register   On: 04/11/2014 07:20     Medications:     Scheduled Medications: . antiseptic oral rinse  7 mL Mouth Rinse QID  . artificial tears  1 application Both Eyes 3 times per day  . aspirin  81 mg Per Tube Daily  . atorvastatin  80 mg Per Tube q1800  . chlorhexidine  15 mL Mouth Rinse BID  . digoxin  0.25 mg Intravenous Daily  . docusate  100 mg Oral BID  . feeding supplement (PRO-STAT SUGAR FREE 64)  60 mL Per Tube QID  . feeding supplement (VITAL HIGH PROTEIN)  1,000 mL Per Tube Q24H  . heparin subcutaneous  5,000 Units Subcutaneous Q8H  . insulin aspart  0-15 Units Subcutaneous 6 times per day  . insulin glargine  35 Units Subcutaneous Daily  . ipratropium  0.5 mg Nebulization Q6H  . levalbuterol  0.63 mg Nebulization Q6H  . multivitamin  5 mL Per Tube Daily  . pantoprazole sodium  40 mg Per Tube Q24H  . sodium chloride  3 mL Intravenous Q12H  . spironolactone  12.5 mg Oral Daily  . ticagrelor  90 mg Per Tube BID  . vancomycin  750 mg Intravenous Q12H     Infusions: . sodium chloride 1,000 mL (03/28/2014 0700)  . amiodarone 60 mg/hr (04/11/14 1900)  . cisatracurium (NIMBEX) infusion 1 mcg/kg/min (04/12/14 0345)  . fentaNYL infusion INTRAVENOUS 400 mcg/hr (04/12/14 0847)  . midazolam (VERSED) infusion 12 mg/hr (04/12/14 0317)  . milrinone 0.125 mcg/kg/min (04/12/14 0848)  . norepinephrine (LEVOPHED) Adult infusion 23 mcg/min (04/11/14 2020)    PRN Medications: sodium chloride, sodium chloride, Place/Maintain arterial line **AND** sodium chloride, acetaminophen, bisacodyl, fentaNYL, Influenza vac split quadrivalent PF, levalbuterol, midazolam, ondansetron (ZOFRAN)  IV, pneumococcal 23 valent vaccine, sennosides, sodium chloride   Assessment:   1. Cardiogenic shock    --s/p Impella placement 1/31, Impella out 2/5.  2. Acute on chronic systolic HF    --EF 10% on echo    --Repeat echo (2/7) with EF 25-30%, peri-apical akinesis, no LV thrombus, normal RV 3. Anterolateral STEMI 1/31   --due to stent occlusion   --s/p PCI with DES to LAD and OM-2 on 1/31   --s/p PCI with DES to RCA 2/5 4. VDRF 5. CAD 6. Tobacco abuse ongoing 7. 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 Hypomagnesemia/hypnatremia 9. Shock liver 10. Anemia s/p 3u RBCs 11. SVT: Amiodarone started 12. Fever 13. MRSA Pneumonia, hospital acquired 14. Acute kidney injury  Plan/Discussion:    Main issue now is severe MRSA PNA and ARDS. Oxygenation very tenuous on 100% FiO2. Will continue vancomycin and vent support. Titrate norepi to keep SBP > 90. Will likely need trach if we can stabilize him. Will stop milrinone, lasix and spiro. D/w Dr. Craige Cotta at bedside.   The patient is critically ill with multiple organ systems failure and requires high complexity decision making for assessment and support, frequent evaluation and titration of therapies, application of advanced monitoring technologies and extensive interpretation of multiple databases.   Critical  Care Time devoted to patient care services described in this note is 35 Minutes.   Length of Stay: 11   Arvilla Meres MD 04/12/2014, 9:56 AM  Advanced Heart Failure Team Pager [phone] (M-F; 7a - 4p)  Please contact [facility] for night-coverage after hours (4p -7a ) and weekends on [email or web address]

## 2014-04-12 NOTE — Progress Notes (Signed)
Wean per ARDS.

## 2014-04-13 ENCOUNTER — Inpatient Hospital Stay (HOSPITAL_COMMUNITY): Payer: Medicare Other

## 2014-04-13 LAB — BASIC METABOLIC PANEL
Anion gap: 10 (ref 5–15)
BUN: 75 mg/dL — AB (ref 6–23)
CO2: 29 mmol/L (ref 19–32)
Calcium: 7.7 mg/dL — ABNORMAL LOW (ref 8.4–10.5)
Chloride: 110 mmol/L (ref 96–112)
Creatinine, Ser: 1.25 mg/dL (ref 0.50–1.35)
GFR, EST AFRICAN AMERICAN: 72 mL/min — AB (ref >90)
GFR, EST NON AFRICAN AMERICAN: 62 mL/min — AB (ref >90)
Glucose, Bld: 129 mg/dL — ABNORMAL HIGH (ref 70–99)
Potassium: 3.6 mmol/L (ref 3.5–5.1)
SODIUM: 149 mmol/L — AB (ref 135–145)

## 2014-04-13 LAB — GLUCOSE, CAPILLARY
GLUCOSE-CAPILLARY: 127 mg/dL — AB (ref 70–99)
GLUCOSE-CAPILLARY: 127 mg/dL — AB (ref 70–99)
GLUCOSE-CAPILLARY: 153 mg/dL — AB (ref 70–99)
Glucose-Capillary: 116 mg/dL — ABNORMAL HIGH (ref 70–99)
Glucose-Capillary: 125 mg/dL — ABNORMAL HIGH (ref 70–99)
Glucose-Capillary: 164 mg/dL — ABNORMAL HIGH (ref 70–99)
Glucose-Capillary: 171 mg/dL — ABNORMAL HIGH (ref 70–99)
Glucose-Capillary: 175 mg/dL — ABNORMAL HIGH (ref 70–99)

## 2014-04-13 LAB — CBC
HCT: 30.4 % — ABNORMAL LOW (ref 39.0–52.0)
Hemoglobin: 9 g/dL — ABNORMAL LOW (ref 13.0–17.0)
MCH: 28.2 pg (ref 26.0–34.0)
MCHC: 29.6 g/dL — ABNORMAL LOW (ref 30.0–36.0)
MCV: 95.3 fL (ref 78.0–100.0)
PLATELETS: 454 10*3/uL — AB (ref 150–400)
RBC: 3.19 MIL/uL — ABNORMAL LOW (ref 4.22–5.81)
RDW: 17.1 % — AB (ref 11.5–15.5)
WBC: 14.2 10*3/uL — ABNORMAL HIGH (ref 4.0–10.5)

## 2014-04-13 LAB — HEPATIC FUNCTION PANEL
ALBUMIN: 1.5 g/dL — AB (ref 3.5–5.2)
ALT: 63 U/L — AB (ref 0–53)
AST: 91 U/L — ABNORMAL HIGH (ref 0–37)
Alkaline Phosphatase: 319 U/L — ABNORMAL HIGH (ref 39–117)
BILIRUBIN DIRECT: 0.6 mg/dL — AB (ref 0.0–0.5)
Indirect Bilirubin: 0.8 mg/dL (ref 0.3–0.9)
TOTAL PROTEIN: 6 g/dL (ref 6.0–8.3)
Total Bilirubin: 1.4 mg/dL — ABNORMAL HIGH (ref 0.3–1.2)

## 2014-04-13 LAB — PROCALCITONIN: Procalcitonin: 0.81 ng/mL

## 2014-04-13 LAB — BLOOD GAS, ARTERIAL
ACID-BASE EXCESS: 3.7 mmol/L — AB (ref 0.0–2.0)
BICARBONATE: 27.9 meq/L — AB (ref 20.0–24.0)
Drawn by: 418751
FIO2: 0.7 %
O2 Saturation: 94.7 %
PEEP: 14 cmH2O
PH ART: 7.407 (ref 7.350–7.450)
Patient temperature: 99.7
RATE: 28 resp/min
TCO2: 29.3 mmol/L (ref 0–100)
pCO2 arterial: 45.6 mmHg — ABNORMAL HIGH (ref 35.0–45.0)
pO2, Arterial: 80.9 mmHg (ref 80.0–100.0)

## 2014-04-13 LAB — CARBOXYHEMOGLOBIN
CARBOXYHEMOGLOBIN: 1.1 % (ref 0.5–1.5)
Methemoglobin: 0.8 % (ref 0.0–1.5)
O2 Saturation: 76.5 %
Total hemoglobin: 9.4 g/dL — ABNORMAL LOW (ref 13.5–18.0)

## 2014-04-13 LAB — MAGNESIUM: MAGNESIUM: 3.1 mg/dL — AB (ref 1.5–2.5)

## 2014-04-13 LAB — DIGOXIN LEVEL: DIGOXIN LVL: 0.7 ng/mL — AB (ref 0.8–2.0)

## 2014-04-13 MED ORDER — SORBITOL 70 % SOLN
30.0000 mL | Freq: Once | Status: AC
  Filled 2014-04-13: qty 30

## 2014-04-13 MED ORDER — VANCOMYCIN HCL IN DEXTROSE 1-5 GM/200ML-% IV SOLN
1000.0000 mg | Freq: Two times a day (BID) | INTRAVENOUS | Status: DC
  Administered 2014-04-13 – 2014-04-21 (×16): 1000 mg via INTRAVENOUS
  Filled 2014-04-13 (×21): qty 200

## 2014-04-13 MED ORDER — FUROSEMIDE 10 MG/ML IJ SOLN
40.0000 mg | Freq: Once | INTRAMUSCULAR | Status: AC
  Administered 2014-04-13: 40 mg via INTRAVENOUS
  Filled 2014-04-13: qty 4

## 2014-04-13 MED ORDER — SORBITOL 70 % SOLN
30.0000 mL | Freq: Every day | Status: DC | PRN
  Administered 2014-04-13 – 2014-04-23 (×2): 30 mL
  Filled 2014-04-13 (×6): qty 30

## 2014-04-13 NOTE — Progress Notes (Signed)
Patient ID: Micheal Ayala, male   DOB: Sep 08, 1955, 59 y.o.   MRN: [id number]  Advanced Heart Failure Rounding Note   Subjective:    Impella removed 2/5.  Off norepinephrine 2/5.   Echo (2/7) with EF 25-30%, peri-apical akinesis, no LV thrombus noted, normal RV.   Remains intubated in setting of severe MRSA PNA and ARDS. FiO2 down to 70%  Norepi down to 5 mcg. Lasix held due to worsening renal function yesterday but better today. Cr 1.6->1.25. Weight up about 10 pounds from nadir. Co-ox 77% CVP 17-20  No BM for over 1 week   CXR with diffuse infiltrates .    Objective:   Weight Range:  Vital Signs:   Temp:  [99.1 F (37.3 C)-100.5 F (38.1 C)] 99.8 F (37.7 C) (02/12 0800) Pulse Rate:  [28-102] 28 (02/12 0900) Resp:  [16-28] 28 (02/12 0900) BP: (86-120)/(52-75) 95/64 mmHg (02/12 0400) SpO2:  [87 %-100 %] 95 % (02/12 0900) Arterial Line BP: (86-131)/(48-81) 102/52 mmHg (02/12 0900) FiO2 (%):  [70 %-90 %] 70 % (02/12 0800) Weight:  [104.4 kg (230 lb 2.6 oz)] 104.4 kg (230 lb 2.6 oz) (02/12 0415) Last BM Date:  (PTA)  Weight change: Filed Weights   04/11/14 0500 04/12/14 0600 04/13/14 0415  Weight: 103.1 kg (227 lb 4.7 oz) 104.9 kg (231 lb 4.2 oz) 104.4 kg (230 lb 2.6 oz)    Intake/Output:   Intake/Output Summary (Last 24 hours) at 04/13/14 1013 Last data filed at 04/13/14 0902  Gross per 24 hour  Intake 3828.48 ml  Output   2565 ml  Net 1263.48 ml     Physical Exam: General:  Intubated. Paralyzed HEENT: normal except for R eye enucleation  OGT with dark drainage Neck: supple.Carotids 2+ bilat; no bruits.  JVP flat Cor: PMI laterally displaced. Regular tachy  +s3 Lungs: rhonchi Abdomen: soft, nontender, + distended. No hepatosplenomegaly. No bruits or masses. Hypoactive bowel sounds. Extremities: no cyanosis, clubbing, rash, no edema. Neuro: awake on vent. agitated  Telemetry: Sinus tach 100  Labs: Basic Metabolic Panel:  Recent Labs Lab 04/08/14 0440  04/09/14 0400 04/10/14 0459 04/11/14 0330 04/12/14 0345 04/13/14 0400  NA 138 140 142 143 143 149*  K 3.7 3.7 3.2* 3.6 4.3 3.6  CL 105 103 103 106 107 110  CO2 GLUCOSE 115* 108* 127* 128* 156* 129*  BUN 37* 40* 41* 53* 83* 75*  CREATININE 0.93 1.06 1.07 1.18 1.62* 1.25  CALCIUM 7.7* 7.5* 7.5* 7.6* 7.2* 7.7*  MG 2.4 2.5 2.6* 2.8*  --  3.1*  PHOS 4.2 4.1 3.1 2.2*  --   --     Liver Function Tests:  Recent Labs Lab 04/09/14 0400 04/13/14 0400  AST 100* 91*  ALT 148* 63*  ALKPHOS 288* 319*  BILITOT 3.1* 1.4*  PROT 6.1 6.0  ALBUMIN 2.0* 1.5*   No results for input(s): LIPASE, AMYLASE in the last 168 hours. No results for input(s): AMMONIA in the last 168 hours.  CBC:  Recent Labs Lab 04/07/14 0344 04/08/14 0440 04/09/14 0400 04/11/14 0330 04/12/14 0345 04/13/14 0400  WBC 11.2* 11.2* 12.8* 14.6* 18.3* 14.2*  NEUTROABS 7.8* 8.2* 9.9*  --   --   --   HGB 9.7* 9.3* 10.0* 9.5* 9.6* 9.0*  HCT 29.3* 28.5* 30.6* 30.3* 31.4* 30.4*  MCV 90.2 89.3 90.3 91.3 92.1 95.3  PLT 86* 108* 197 390 542* 454*    Cardiac Enzymes: No results for input(s): CKTOTAL, CKMB,  CKMBINDEX, TROPONINI in the last 168 hours.  BNP: BNP (last 3 results) No results for input(s): PROBNP in the last 8760 hours.   Other results:    Imaging: Dg Chest Port 1 View  04/13/2014   CLINICAL DATA:  ARDS  EXAM: PORTABLE CHEST - 1 VIEW  COMPARISON:  Portable chest x-ray febrile eleventh 2016  FINDINGS: The lungs are well-expanded. Confluent alveolar densities are present in the mid and lower lung zones with increased interstitial density more superiorly. These overall are less conspicuous today. The cardiac silhouette is top-normal in size. The pulmonary vascularity is indistinct. Small bilateral pleural effusions are suspected.  The endotracheal tube tip lies 4.1 cm above the carina. The esophagogastric tube tip projects below the inferior margin of the image. The right subclavian venous  catheter tip projects over the midportion of the SVC.  IMPRESSION: There has been slight interval improvement in the appearance of the upper lobes with decreasing interstitial and alveolar opacities. Persistent mid and lower lung confluent infiltrates are present consistent with ARDS. The support tubes and lines are in appropriate position.   Electronically Signed   By: David  [place]il[place]   On: 04/13/2014 07:19   Dg Chest Port 1 View  04/12/2014   CLINICAL DATA:  Hypoxia.  EXAM: PORTABLE CHEST - 1 VIEW  COMPARISON:  04/11/2014  FINDINGS: Endotracheal tube tip measures 4.8 cm above the carina. Enteric tube tip is off the field of view but below the left hemidiaphragm. Left central venous catheter tip overlies the mid SVC region. No significant change in appearance of the pulmonary appliances. Cardiac enlargement. Bilateral perihilar consolidation again demonstrated. Hazy opacities over the costophrenic angle suggests small effusions. No pneumothorax.  IMPRESSION: Appliances appear in satisfactory position. Persistent bilateral perihilar consolidation. Probable small bilateral pleural effusions.   Electronically Signed   By: Burman NievesWilliam  Stevens M.D.   On: 04/12/2014 01:44     Medications:     Scheduled Medications: . antiseptic oral rinse  7 mL Mouth Rinse QID  . artificial tears  1 application Both Eyes 3 times per day  . aspirin  81 mg Per Tube Daily  . atorvastatin  80 mg Per Tube q1800  . chlorhexidine  15 mL Mouth Rinse BID  . digoxin  0.25 mg Intravenous Daily  . docusate  100 mg Oral BID  . feeding supplement (PRO-STAT SUGAR FREE 64)  60 mL Per Tube BID  . feeding supplement (VITAL HIGH PROTEIN)  1,000 mL Per Tube Q24H  . heparin subcutaneous  5,000 Units Subcutaneous Q8H  . insulin aspart  0-15 Units Subcutaneous 6 times per day  . insulin glargine  35 Units Subcutaneous Daily  . ipratropium  0.5 mg Nebulization Q6H  . levalbuterol  0.63 mg Nebulization Q6H  . multivitamin  5 mL Per Tube Daily   . pantoprazole sodium  40 mg Per Tube Q24H  . sodium chloride  3 mL Intravenous Q12H  . ticagrelor  90 mg Per Tube BID  . vancomycin  750 mg Intravenous Q12H    Infusions: . sodium chloride 1,000 mL (Jul 22, 2014 0700)  . amiodarone 30 mg/hr (04/13/14 1000)  . cisatracurium (NIMBEX) infusion 2 mcg/kg/min (04/13/14 1000)  . fentaNYL infusion INTRAVENOUS 400 mcg/hr (04/13/14 0622)  . midazolam (VERSED) infusion 8 mg/hr (04/13/14 0907)  . norepinephrine (LEVOPHED) Adult infusion 5 mcg/min (04/13/14 0300)    PRN Medications: sodium chloride, sodium chloride, Place/Maintain arterial line **AND** sodium chloride, acetaminophen, bisacodyl, fentaNYL, Influenza vac split quadrivalent PF, levalbuterol, midazolam, ondansetron (ZOFRAN)  IV, pneumococcal 23 valent vaccine, sennosides, sodium chloride   Assessment:   1. Cardiogenic shock    --s/p Impella placement 1/31, Impella out 2/5.  2. Acute on chronic systolic HF    --EF 10% on echo    --Repeat echo (2/7) with EF 25-30%, peri-apical akinesis, no LV thrombus, normal RV 3. Anterolateral STEMI 1/31   --due to stent occlusion   --s/p PCI with DES to LAD and OM-2 on 1/31   --s/p PCI with DES to RCA 2/5 4. VDRF 5. CAD 6. Tobacco abuse ongoing 7. 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 Hypomagnesemia/hypnatremia 9. Shock liver 10. Anemia s/p 3u RBCs 11. SVT: Amiodarone started 12. Fever 13. MRSA Pneumonia, hospital acquired 14. Acute kidney injury  Plan/Discussion:    Main issue now is severe MRSA PNA and ARDS. Oxygenation improving slowly. Now on 70%  FiO2. Will continue vancomycin and vent support. Titrate norepi to keep SBP > 90. Will likely need trach if . Co-ox stable off inotropes. Renal function improving. Will give one dose IV lasix today. Sorbitol for constipation.  D/w Dr. Craige Cotta at bedside.  Continue ASA, statin, brilinta.   The patient is critically ill with multiple organ systems failure and requires high  complexity decision making for assessment and support, frequent evaluation and titration of therapies, application of advanced monitoring technologies and extensive interpretation of multiple databases.   Critical Care Time devoted to patient care services described in this note is 35 Minutes.   Length of Stay: 12 Arvilla Meres MD 04/13/2014, 10:13 AM  Advanced Heart Failure Team Pager [phone] (M-F; 7a - 4p)  Please contact [facility] for night-coverage after hours (4p -7a ) and weekends on [email or web address]

## 2014-04-13 NOTE — Progress Notes (Signed)
PULMONARY / CRITICAL CARE MEDICINE   Name: Micheal Ayala MRN: [id number] DOB: Apr 13, 1955    ADMISSION DATE:  03/05/2014 CONSULTATION DATE:  03/26/2014  REFERRING MD :  Dr. Herbie Baltimore  CHIEF COMPLAINT:  STEMI  INITIAL PRESENTATION:  59 yo smoker with STEMI, cardiogenic shock, VDRF.  He is visiting [place] from [place].  STUDIES:  1/31 Cardiac cath >> severe CAD with occlusion of LAD, circumflex, RCS, ischemic CM >> DES to LAD, circumflex 1/31 Echo >> LVEF 20-25%, severely reduced systolic function 2/04 LHC > DES RCA 2/06 Echo >> EF 25 to 30%, mod LVH, grade 2 diastolic dysfx  SIGNIFICANT EVENTS: 1/31 admit, cardiac cath, impella 2/01 TCTS consulted, transfuse 1 unit PRBC 2/05 impella removed; ?upper GI bleeding 2/08 increased respiratory secretions 2/10 ARDS protocol, added levophed 2/11 start paralytic  SUBJECTIVE:  Remains on paralytic.  VITAL SIGNS: Temp:  [99.1 F (37.3 C)-100.5 F (38.1 C)] 99.7 F (37.6 C) (02/12 0353) Pulse Rate:  [84-102] 84 (02/12 0700) Resp:  [16-28] 28 (02/12 0700) BP: (83-120)/(52-75) 95/64 mmHg (02/12 0400) SpO2:  [87 %-100 %] 98 % (02/12 0744) Arterial Line BP: (86-131)/(48-81) 95/52 mmHg (02/12 0700) FiO2 (%):  [70 %-90 %] 70 % (02/12 0744) Weight:  [230 lb 2.6 oz (104.4 kg)] 230 lb 2.6 oz (104.4 kg) (02/12 0415) HEMODYNAMICS: CVP:  [9 mmHg-14 mmHg] 13 mmHg VENTILATOR SETTINGS: Vent Mode:  [-] PRVC FiO2 (%):  [70 %-90 %] 70 % Set Rate:  [28 bmp] 28 bmp Vt Set:  [450 mL] 450 mL PEEP:  [14 cmH20] 14 cmH20 Plateau Pressure:  [30 cmH20-31 cmH20] 31 cmH20 INTAKE / OUTPUT:  Intake/Output Summary (Last 24 hours) at 04/13/14 0902 Last data filed at 04/13/14 0700  Gross per 24 hour  Intake 3677.68 ml  Output   2500 ml  Net 1177.68 ml   PHYSICAL EXAMINATION:  Gen: paralyzed HEENT: Rt eye enucleated, ETT in place PULM: b/l rhonchi/rales CV: irregular AB: decreased bowel sounds, soft Ext: no edema Neuro: RASS -5  LABS:  CBC  Recent  Labs Lab 04/11/14 0330 04/12/14 0345 04/13/14 0400  WBC 14.6* 18.3* 14.2*  HGB 9.5* 9.6* 9.0*  HCT 30.3* 31.4* 30.4*  PLT 390 542* 454*   Coag's  Recent Labs Lab 04/07/14 0344  APTT 29   BMET  Recent Labs Lab 04/11/14 0330 04/12/14 0345 04/13/14 0400  NA 143 143 149*  K 3.6 4.3 3.6  CL 106 107 110  CO2 BUN 53* 83* 75*  CREATININE 1.18 1.62* 1.25  GLUCOSE 128* 156* 129*   Electrolytes  Recent Labs Lab 04/09/14 0400 04/10/14 0459 04/11/14 0330 04/12/14 0345 04/13/14 0400  CALCIUM 7.5* 7.5* 7.6* 7.2* 7.7*  MG 2.5 2.6* 2.8*  --  3.1*  PHOS 4.1 3.1 2.2*  --   --    ABG  Recent Labs Lab 04/12/14 0115 04/12/14 0425 04/13/14 0411  PHART 7.361 7.337* 7.407  PCO2ART 48.4* 52.7* 45.6*  PO2ART 47.0* 88.6 80.9   Glucose  Recent Labs Lab 04/12/14 0514 04/12/14 2023 04/12/14 2326 04/12/14 2352 04/13/14 0401 04/13/14 0738  GLUCAP 134* 133* 175* 171* 127* 127*   ASSESSMENT / PLAN:  PULMONARY ETT 1/31>>> A:  Acute respiratory failure due to cardiogenic shock and pulmonary edema.  Developed MRSA HCAP 2/08 with ARDS. Tobacco abuse. P:   Full vent support >> might need trach if not able to extubate soon Changed to ARDS protocol 2/10 Scheduled BD's >> changed to atrovent and xopenex in setting of tachycardia F/u  CXR, ABG  CARDIOVASCULAR L Impella 1/31>>>2/5 Lt [place] CVL 2/08 >> A:  Cardiogenic shock post STEMI on admission. Septic shock developed from MRSA PNA 2/10. SVT, A fib with RVR. P: Amiodarone, brilinta per cardiology Hold lasix, aldactone for now Continue ASA, lipitor, digoxin per cardiology  RENAL A:  AKI in setting of cardiogenic/septic shock. Hypokalemia. P:   Monitor BMET and UOP Replace electrolytes as needed Even fluid balance  GASTROINTESTINAL A:   Nutrition. Constipation. ?upper GI bleed 2/05 >> no further episodes since. P:   protonix BID Bowel regimen  HEMATOLOGIC A:  Thrombocytopenia >> likely from  impella >> resolved. Anemia of critical illness. P:  F/u CBC  SQ heparin for DVT prevention  INFECTIOUS A: MRSA HCAP. P:    Day 4 vancomycin  Blood 2/08 >> Sputum 2/08 >> MRSA  ENDOCRINE A:   Hyperglycemia. P:   SSI with lantus  NEUROLOGIC A:  Acute encephalopathy 2nd to respiratory failure, cardiogenic shock. P:   RASS goal -5 while on nimbex >> plan to continue through 2/13  Summary: Initial presentation with STEMI, cardiogenic shock, ischemic CM, pulmonary edema, VDRF.  Now complicated by MRSA PNA with ARDS and septic shock.  CC time 35 minutes.  Micheal HellingVineet Wister Hoefle, MD [facility] Pulmonary/Critical Care 04/13/2014, 9:02 AM Pager:  [phone] After 3pm call: [phone]

## 2014-04-14 ENCOUNTER — Inpatient Hospital Stay (HOSPITAL_COMMUNITY): Payer: Medicare Other

## 2014-04-14 LAB — BLOOD GAS, ARTERIAL
Acid-Base Excess: 4.4 mmol/L — ABNORMAL HIGH (ref 0.0–2.0)
Bicarbonate: 28.9 mEq/L — ABNORMAL HIGH (ref 20.0–24.0)
DRAWN BY: 418751
FIO2: 100 %
MECHVT: 450 mL
O2 SAT: 98.1 %
PCO2 ART: 47.9 mmHg — AB (ref 35.0–45.0)
PEEP: 14 cmH2O
Patient temperature: 99.1
RATE: 28 resp/min
TCO2: 30.3 mmol/L (ref 0–100)
pH, Arterial: 7.399 (ref 7.350–7.450)
pO2, Arterial: 101 mmHg — ABNORMAL HIGH (ref 80.0–100.0)

## 2014-04-14 LAB — BASIC METABOLIC PANEL
Anion gap: 8 (ref 5–15)
BUN: 70 mg/dL — ABNORMAL HIGH (ref 6–23)
CALCIUM: 7.4 mg/dL — AB (ref 8.4–10.5)
CO2: 31 mmol/L (ref 19–32)
CREATININE: 1.25 mg/dL (ref 0.50–1.35)
Chloride: 111 mmol/L (ref 96–112)
GFR calc Af Amer: 72 mL/min — ABNORMAL LOW (ref >90)
GFR calc non Af Amer: 62 mL/min — ABNORMAL LOW (ref >90)
Glucose, Bld: 125 mg/dL — ABNORMAL HIGH (ref 70–99)
POTASSIUM: 3.1 mmol/L — AB (ref 3.5–5.1)
Sodium: 150 mmol/L — ABNORMAL HIGH (ref 135–145)

## 2014-04-14 LAB — GLUCOSE, CAPILLARY
GLUCOSE-CAPILLARY: 117 mg/dL — AB (ref 70–99)
GLUCOSE-CAPILLARY: 180 mg/dL — AB (ref 70–99)
GLUCOSE-CAPILLARY: 159 mg/dL — AB (ref 70–99)
Glucose-Capillary: 151 mg/dL — ABNORMAL HIGH (ref 70–99)
Glucose-Capillary: 103 mg/dL — ABNORMAL HIGH (ref 70–99)

## 2014-04-14 LAB — CARBOXYHEMOGLOBIN
CARBOXYHEMOGLOBIN: 1.2 % (ref 0.5–1.5)
Methemoglobin: 0.6 % (ref 0.0–1.5)
O2 SAT: 69.2 %
TOTAL HEMOGLOBIN: 9.3 g/dL — AB (ref 13.5–18.0)

## 2014-04-14 LAB — CBC
HCT: 31 % — ABNORMAL LOW (ref 39.0–52.0)
Hemoglobin: 9 g/dL — ABNORMAL LOW (ref 13.0–17.0)
MCH: 28 pg (ref 26.0–34.0)
MCHC: 29 g/dL — AB (ref 30.0–36.0)
MCV: 96.3 fL (ref 78.0–100.0)
Platelets: 447 10*3/uL — ABNORMAL HIGH (ref 150–400)
RBC: 3.22 MIL/uL — ABNORMAL LOW (ref 4.22–5.81)
RDW: 17.9 % — ABNORMAL HIGH (ref 11.5–15.5)
WBC: 14.2 10*3/uL — AB (ref 4.0–10.5)

## 2014-04-14 LAB — PROCALCITONIN: PROCALCITONIN: 0.59 ng/mL

## 2014-04-14 LAB — PHOSPHORUS: Phosphorus: 3.4 mg/dL (ref 2.3–4.6)

## 2014-04-14 MED ORDER — FUROSEMIDE 10 MG/ML IJ SOLN
40.0000 mg | Freq: Once | INTRAMUSCULAR | Status: AC
  Administered 2014-04-14: 40 mg via INTRAVENOUS

## 2014-04-14 MED ORDER — POTASSIUM CHLORIDE 10 MEQ/50ML IV SOLN
10.0000 meq | INTRAVENOUS | Status: AC
  Administered 2014-04-14 (×3): 10 meq via INTRAVENOUS
  Filled 2014-04-14: qty 50

## 2014-04-14 MED ORDER — QUETIAPINE FUMARATE 100 MG PO TABS
100.0000 mg | ORAL_TABLET | Freq: Two times a day (BID) | ORAL | Status: DC
Start: 2014-04-14 — End: 2014-04-23
  Administered 2014-04-14 – 2014-04-22 (×18): 100 mg via ORAL
  Filled 2014-04-14 (×21): qty 1

## 2014-04-14 MED ORDER — FREE WATER
100.0000 mL | Freq: Four times a day (QID) | Status: DC
  Administered 2014-04-14 – 2014-04-15 (×5): 100 mL

## 2014-04-14 NOTE — Progress Notes (Signed)
Nimbex weaned and off at 1210. Pt became suddenly hypoxic with sat of 79% and using accessory muscles at 1244. Rn suctioned with no results. Increased fi02 to 100% and called respiratory at 1250. Await respiratory input. Pt is now with sats of 98% on 100%fio2and is not using accessory muscles .Pt is calm and not fighting vent now.  Tammy SoursAngela Wilhemenia Ayala

## 2014-04-14 NOTE — Progress Notes (Signed)
Patient ID: Micheal Ayala, male   DOB: 12/13/1955, 59 y.o.   MRN: [id number]  Advanced Heart Failure Rounding Note   Subjective:    Impella removed 2/5.  Off norepinephrine 2/5.   Echo (2/7) with EF 25-30%, peri-apical akinesis, no LV thrombus noted, normal RV.   Remains intubated/paralyzed in setting of severe MRSA PNA and ARDS. FiO2 back up to 80%. Remains on low-dose Norepi. Lasix given last night for CVP 16 Creatinine stable. Co-ox ok. CVP 13 this am. NA = 150  No BM for over 1 week   CXR with diffuse infiltrates .    Objective:   Weight Range:  Vital Signs:   Temp:  [99.1 F (37.3 C)-102.4 F (39.1 C)] 99.1 F (37.3 C) (02/13 0400) Pulse Rate:  [28-102] 90 (02/13 0740) Resp:  [15-28] 28 (02/13 0740) BP: (108-118)/(61-65) 108/61 mmHg (02/13 0740) SpO2:  [88 %-98 %] 94 % (02/13 0700) Arterial Line BP: (93-123)/(46-63) 110/60 mmHg (02/13 0700) FiO2 (%):  [70 %-100 %] 80 % (02/13 0740) Weight:  [105 kg (231 lb 7.7 oz)] 105 kg (231 lb 7.7 oz) (02/13 0351) Last BM Date: 04/14/14  Weight change: Filed Weights   04/12/14 0600 04/13/14 0415 04/14/14 0351  Weight: 104.9 kg (231 lb 4.2 oz) 104.4 kg (230 lb 2.6 oz) 105 kg (231 lb 7.7 oz)    Intake/Output:   Intake/Output Summary (Last 24 hours) at 04/14/14 0758 Last data filed at 04/14/14 0700  Gross per 24 hour  Intake 3612.3 ml  Output   3750 ml  Net -137.7 ml     Physical Exam: General:  Intubated. Paralyzed HEENT: normal except for R eye enucleation  OGT with dark drainage Neck: supple.Carotids 2+ bilat; no bruits.  JVP flat Cor: PMI laterally displaced. Regular tachy  +s3 Lungs: rhonchi Abdomen: soft, nontender, + distended. No hepatosplenomegaly. No bruits or masses. Hypoactive bowel sounds. Extremities: no cyanosis, clubbing, rash, no edema. Neuro: awake on vent. agitated  Telemetry: Sinus tach 100  Labs: Basic Metabolic Panel:  Recent Labs Lab 04/08/14 0440 04/09/14 0400 04/10/14 0459  04/11/14 0330 04/12/14 0345 04/13/14 0400 04/14/14 0355  NA 138 140 142 143 143 149* 150*  K 3.7 3.7 3.2* 3.6 4.3 3.6 3.1*  CL 105 103 103 106 107 110 111  CO2 26 31 28 29 29 29 31   GLUCOSE 115* 108* 127* 128* 156* 129* 125*  BUN 37* 40* 41* 53* 83* 75* 70*  CREATININE 0.93 1.06 1.07 1.18 1.62* 1.25 1.25  CALCIUM 7.7* 7.5* 7.5* 7.6* 7.2* 7.7* 7.4*  MG 2.4 2.5 2.6* 2.8*  --  3.1*  --   PHOS 4.2 4.1 3.1 2.2*  --   --   --     Liver Function Tests:  Recent Labs Lab 04/09/14 0400 04/13/14 0400  AST 100* 91*  ALT 148* 63*  ALKPHOS 288* 319*  BILITOT 3.1* 1.4*  PROT 6.1 6.0  ALBUMIN 2.0* 1.5*   No results for input(s): LIPASE, AMYLASE in the last 168 hours. No results for input(s): AMMONIA in the last 168 hours.  CBC:  Recent Labs Lab 04/08/14 0440 04/09/14 0400 04/11/14 0330 04/12/14 0345 04/13/14 0400 04/14/14 0355  WBC 11.2* 12.8* 14.6* 18.3* 14.2* 14.2*  NEUTROABS 8.2* 9.9*  --   --   --   --   HGB 9.3* 10.0* 9.5* 9.6* 9.0* 9.0*  HCT 28.5* 30.6* 30.3* 31.4* 30.4* 31.0*  MCV 89.3 90.3 91.3 92.1 95.3 96.3  PLT 108* 197 390 542* 454* 447*  Cardiac Enzymes: No results for input(s): CKTOTAL, CKMB, CKMBINDEX, TROPONINI in the last 168 hours.  BNP: BNP (last 3 results) No results for input(s): PROBNP in the last 8760 hours.   Other results:    Imaging: Dg Chest Port 1 View  04/14/2014   CLINICAL DATA:  ARDS  EXAM: PORTABLE CHEST - 1 VIEW  COMPARISON:  04/13/2014  FINDINGS: Endotracheal tube tip is between the clavicular heads and carina. Left subclavian central line, tip at the SVC. A gastric suction tube tip is in the distal stomach.  Unchanged diffuse lung opacity. Hazy appearance of the lower chest compatible with layering pleural effusions. No air leak. Normal heart size and mediastinal contours. Coronary stent noted.  IMPRESSION: 1. Stable positioning of tubes and central line. 2. Unchanged edema and layering pleural effusions.   Electronically Signed    By: Marnee Spring M.D.   On: 04/14/2014 07:21   Dg Chest Port 1 View  04/13/2014   CLINICAL DATA:  ARDS  EXAM: PORTABLE CHEST - 1 VIEW  COMPARISON:  Portable chest x-ray febrile eleventh 2016  FINDINGS: The lungs are well-expanded. Confluent alveolar densities are present in the mid and lower lung zones with increased interstitial density more superiorly. These overall are less conspicuous today. The cardiac silhouette is top-normal in size. The pulmonary vascularity is indistinct. Small bilateral pleural effusions are suspected.  The endotracheal tube tip lies 4.1 cm above the carina. The esophagogastric tube tip projects below the inferior margin of the image. The right subclavian venous catheter tip projects over the midportion of the SVC.  IMPRESSION: There has been slight interval improvement in the appearance of the upper lobes with decreasing interstitial and alveolar opacities. Persistent mid and lower lung confluent infiltrates are present consistent with ARDS. The support tubes and lines are in appropriate position.   Electronically Signed   By: David  [place]   On: 04/13/2014 07:19     Medications:     Scheduled Medications: . antiseptic oral rinse  7 mL Mouth Rinse QID  . artificial tears  1 application Both Eyes 3 times per day  . aspirin  81 mg Per Tube Daily  . atorvastatin  80 mg Per Tube q1800  . chlorhexidine  15 mL Mouth Rinse BID  . digoxin  0.25 mg Intravenous Daily  . docusate  100 mg Oral BID  . feeding supplement (PRO-STAT SUGAR FREE 64)  60 mL Per Tube BID  . feeding supplement (VITAL HIGH PROTEIN)  1,000 mL Per Tube Q24H  . heparin subcutaneous  5,000 Units Subcutaneous Q8H  . insulin aspart  0-15 Units Subcutaneous 6 times per day  . insulin glargine  35 Units Subcutaneous Daily  . ipratropium  0.5 mg Nebulization Q6H  . levalbuterol  0.63 mg Nebulization Q6H  . multivitamin  5 mL Per Tube Daily  . pantoprazole sodium  40 mg Per Tube Q24H  . potassium chloride   10 mEq Intravenous Q1 Hr x 3  . sodium chloride  3 mL Intravenous Q12H  . ticagrelor  90 mg Per Tube BID  . vancomycin  1,000 mg Intravenous Q12H    Infusions: . sodium chloride 1,000 mL (03/29/2014 0700)  . amiodarone 30 mg/hr (04/14/14 0537)  . cisatracurium (NIMBEX) infusion 2 mcg/kg/min (04/14/14 0220)  . fentaNYL infusion INTRAVENOUS 400 mcg/hr (04/14/14 0219)  . midazolam (VERSED) infusion 8 mg/hr (04/14/14 0411)  . norepinephrine (LEVOPHED) Adult infusion Stopped (04/13/14 2330)    PRN Medications: sodium chloride, sodium chloride, Place/Maintain arterial  line **AND** sodium chloride, acetaminophen, bisacodyl, fentaNYL, Influenza vac split quadrivalent PF, levalbuterol, midazolam, ondansetron (ZOFRAN) IV, pneumococcal 23 valent vaccine, sennosides, sodium chloride, sorbitol   Assessment:   1. Cardiogenic shock    --s/p Impella placement 1/31, Impella out 2/5.  2. Acute on chronic systolic HF    --EF 10% on echo    --Repeat echo (2/7) with EF 25-30%, peri-apical akinesis, no LV thrombus, normal RV 3. Anterolateral STEMI 1/31   --due to stent occlusion   --s/p PCI with DES to LAD and OM-2 on 1/31   --s/p PCI with DES to RCA 2/5 4. VDRF 5. CAD 6. Tobacco abuse ongoing 7. 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 Hypomagnesemia/hyponatremia 9. Anemia s/p 3u RBCs 10. SVT: Amiodarone started 11. MRSA Pneumonia, hospital acquired 12. Acute kidney injury 13. Hypernatremia  Plan/Discussion:    Main issue now is severe MRSA PNA and ARDS. Oxygenation worsening again now on 80% FiO2. Will continue vancomycin and vent support. Titrate norepi to keep SBP > 90. Will likely need trach this week. Marland KitchenCo-ox stable off inotropes. Renal function stable.   With hypernatremia will hold lasix and start free water via TFs. Sorbitol for constipation.   Continue ASA, statin, brilinta.   The patient is critically ill with multiple organ systems failure and requires high  complexity decision making for assessment and support, frequent evaluation and titration of therapies, application of advanced monitoring technologies and extensive interpretation of multiple databases.   Critical Care Time devoted to patient care services described in this note is 35 Minutes.   Length of Stay: 13 Arvilla Meres MD 04/14/2014, 7:58 AM  Advanced Heart Failure Team Pager [phone] (M-F; 7a - 4p)  Please contact [facility] for night-coverage after hours (4p -7a ) and weekends on [email or web address]

## 2014-04-14 NOTE — Progress Notes (Signed)
PULMONARY / CRITICAL CARE MEDICINE   Name: Micheal Ayala MRN: [id number] DOB: 09/15/1955    ADMISSION DATE:  08/24/14 CONSULTATION DATE:  29-May-2014  REFERRING MD :  Dr. Herbie BaltimoreHarding  CHIEF COMPLAINT:  STEMI  INITIAL PRESENTATION:  59 yo smoker with STEMI, cardiogenic shock, VDRF.  He is visiting [place] from [place].  STUDIES:  1/31 Cardiac cath >> severe CAD with occlusion of LAD, circumflex, RCS, ischemic CM >> DES to LAD, circumflex 1/31 Echo >> LVEF 20-25%, severely reduced systolic function 2/04 LHC > DES RCA 2/06 Echo >> EF 25 to 30%, mod LVH, grade 2 diastolic dysfx  SIGNIFICANT EVENTS: 1/31 admit, cardiac cath, impella 2/01 TCTS consulted, transfuse 1 unit PRBC 2/05 impella removed; ?upper GI bleeding 2/08 increased respiratory secretions 2/10 ARDS protocol, added levophed 2/11 start paralytic  SUBJECTIVE:  Remains on paralytic.  Febrile overnight.  Oxygenation worsened overnight with vent changes to 6cc/kg.  Remains on 80%, 14 peep  VITAL SIGNS: Temp:  [99.1 F (37.3 C)-102.4 F (39.1 C)] 99.1 F (37.3 C) (02/13 0400) Pulse Rate:  [28-102] 90 (02/13 0740) Resp:  [15-28] 28 (02/13 0740) BP: (108-118)/(61-65) 108/61 mmHg (02/13 0740) SpO2:  [88 %-98 %] 94 % (02/13 0700) Arterial Line BP: (93-123)/(46-63) 110/60 mmHg (02/13 0700) FiO2 (%):  [70 %-100 %] 80 % (02/13 0740) Weight:  [231 lb 7.7 oz (105 kg)] 231 lb 7.7 oz (105 kg) (02/13 0351) HEMODYNAMICS: CVP:  [15 mmHg-18 mmHg] 16 mmHg VENTILATOR SETTINGS: Vent Mode:  [-] PRVC FiO2 (%):  [70 %-100 %] 80 % Set Rate:  [28 bmp] 28 bmp Vt Set:  [120 mL-450 mL] 450 mL PEEP:  [14 cmH20] 14 cmH20 Plateau Pressure:  [31 cmH20-38 cmH20] 31 cmH20 INTAKE / OUTPUT:  Intake/Output Summary (Last 24 hours) at 04/14/14 0818 Last data filed at 04/14/14 0700  Gross per 24 hour  Intake 3480.5 ml  Output   3625 ml  Net -144.5 ml   PHYSICAL EXAMINATION:  Gen: paralyzed, hemodynamically stable  HEENT: Rt eye enucleated, ETT in  place PULM: resps even on vent, b/l rhonchi/rales, diminished bases  CV: irregular AB: decreased bowel sounds, soft Ext: no edema Neuro: RASS -5  LABS:  CBC  Recent Labs Lab 04/12/14 0345 04/13/14 0400 04/14/14 0355  WBC 18.3* 14.2* 14.2*  HGB 9.6* 9.0* 9.0*  HCT 31.4* 30.4* 31.0*  PLT 542* 454* 447*   Coag's No results for input(s): APTT, INR in the last 168 hours. BMET  Recent Labs Lab 04/12/14 0345 04/13/14 0400 04/14/14 0355  NA 143 149* 150*  K 4.3 3.6 3.1*  CL 107 110 111  CO2 29 29 31   BUN 83* 75* 70*  CREATININE 1.62* 1.25 1.25  GLUCOSE 156* 129* 125*   Electrolytes  Recent Labs Lab 04/09/14 0400 04/10/14 0459 04/11/14 0330 04/12/14 0345 04/13/14 0400 04/14/14 0355  CALCIUM 7.5* 7.5* 7.6* 7.2* 7.7* 7.4*  MG 2.5 2.6* 2.8*  --  3.1*  --   PHOS 4.1 3.1 2.2*  --   --   --    ABG  Recent Labs Lab 04/12/14 0425 04/13/14 0411 04/14/14 0336  PHART 7.337* 7.407 7.399  PCO2ART 52.7* 45.6* 47.9*  PO2ART 88.6 80.9 101.0*   Glucose  Recent Labs Lab 04/13/14 0738 04/13/14 1146 04/13/14 1536 04/13/14 1926 04/13/14 2343 04/14/14 0336  GLUCAP 127* 164* 116* 125* 153* 117*   ASSESSMENT / PLAN:  PULMONARY ETT 1/31>>> A:  Acute respiratory failure due to cardiogenic shock and pulmonary edema.   MRSA HCAP 2/08  with ARDS. Tobacco abuse. P:   Full vent support >> might need trach if not able to extubate soon Changed to ARDS protocol 2/10 Liberalize Vt to 7cc/kg 2/13 F/u ABG  Attempt wean off paralytic  Scheduled BD's >> changed to atrovent and xopenex in setting of tachycardia F/u CXR, ABG Cont attempts at gentle diuresis as bp and Scr tol   CARDIOVASCULAR L Impella 1/31>>>2/5 Lt [place] CVL 2/08 >> A:  Cardiogenic shock post STEMI on admission. Septic shock developed from MRSA PNA 2/10. SVT, A fib with RVR. Cvp=13 2/13 P: Amiodarone, brilinta per cardiology Hold aldactone for now Continue ASA, lipitor, digoxin per cardiology S/p  lasix x1 2/13 - daily eval   RENAL A:  AKI in setting of cardiogenic/septic shock. Hypokalemia. Hypernatremia  P:   Monitor BMET and UOP Replace electrolytes as needed Gentle free water    GASTROINTESTINAL A:   Nutrition. Constipation. ?upper GI bleed 2/05 >> no further episodes since. P:   protonix BID Bowel regimen  HEMATOLOGIC A:  Thrombocytopenia >> likely from impella >> resolved. Anemia of critical illness. P:  F/u CBC  SQ heparin for DVT prevention  INFECTIOUS A: MRSA HCAP. P:    Vanc 2/8>>>  Blood 2/08 >>ng Sputum 2/08 >> MRSA  ENDOCRINE A:   Hyperglycemia. P:   SSI with lantus  NEUROLOGIC A:  Acute encephalopathy 2nd to respiratory failure, cardiogenic shock. P:   Goal wean off nimbex 2/13 if tolerates  RASS goal -5 while on nimbex, lower goal to -3 once off, still needs good vent synchrony with 14 peep, 7cc/kg     Dirk Dress, NP 04/14/2014  8:18 AM Pager: [phone] or [phone]  ATTENDING NOTE: I have personally reviewed patient's available data, including medical history, events of note, physical examination and test results as part of my evaluation. I have discussed with resident/NP and other careteam providers such as pharmacist, RN and RRT & co-ordinated with consultants. In addition, I personally evaluated patient and elicited key history of cardiogenic shock, post STEMI, complicated by MRSA HCAP and ARDS, exam findings of bilateral coarse crackles, CVP 13, overbreathing the vent now that Nimbex stopped & labs showing a rising sodium 150, slight drop in BUN, stable creatinine at 1.2, low pro-calcitonin.   Recommend-would like to diurese more, but limited by rising sodium-hold for 24 hours. Start low-dose tube feeding Might add Seroquel and Klonopin-and attempt to taper off drips, will need tracheostomy if we are to press forward here.  Rest per NP/medical resident whose note is outlined above and that I agree with and edited  in full.   The patient is critically ill with multiple organ systems failure and requires high complexity decision making for assessment and support, frequent evaluation and titration of therapies, application of advanced monitoring technologies and extensive interpretation of multiple databases. Critical Care Time devoted to patient care services described in this note independent of APP time is 35 minutes.    Oretha Milch MD

## 2014-04-14 NOTE — Progress Notes (Signed)
eLink Physician-Brief Progress Note Patient Name: Micheal Ayala DOB: 09/17/1955 MRN: [id number]   Date of Service  04/14/2014  HPI/Events of Note  K low  eICU Interventions  K supp IV     Intervention Category Major Interventions: Electrolyte abnormality - evaluation and management  [name] 04/14/2014, 6:03 AM

## 2014-04-14 NOTE — Progress Notes (Signed)
Patient's oxygen saturation noted to be decreasing to average of 87-91%, respiratory aware, lungs increasingly more , did recruitment twice so far. Patient also having fever at this time. E-link MD notified and made aware. 40mg  IV lasix administered and FiO2 increased to 100. Ice packs used to lower patient's temperature.

## 2014-04-14 NOTE — Progress Notes (Signed)
Patient has not been tolerating vent since nimbex discontinued. Respiratory Therapy has adjusted vent several times. RN has adjusted meds as blood pressure would allow,and patient was still stacking breaths on the vent and at times his sats were lowered. Elink called Dr. Vassie LollAlva at bedside and decided to restart nimbex protocol for ARDS. Pt is now comfortable,not fighting vent and vitals are improved.  Tammy SoursAngela Tea Collums

## 2014-04-14 NOTE — Progress Notes (Signed)
At 1800 pt began to desat to 70's,no dyschrony, RT called, and RT and Rn able to suction patient for large amount of thick brown sputum, intermittent bagging required. Sats now 98%.  Micheal SoursAngela Dottie Vaquerano

## 2014-04-14 NOTE — Progress Notes (Signed)
ANTIBIOTIC CONSULT NOTE - FOLLOW UP  Pharmacy Consult for Vancomycin  Indication: MRSA HCAP  No Known Allergies  Patient Measurements: Height: 5\' 11"  (180.3 cm) Weight: 231 lb 7.7 oz (105 kg) IBW/kg (Calculated) : 75.3  Vital Signs: Temp: 99.1 F (37.3 C) (02/13 0400) Temp Source: Oral (02/13 0400) BP: 108/61 mmHg (02/13 0740) Pulse Rate: 90 (02/13 0740) Intake/Output from previous day: 02/12 0701 - 02/13 0700 In: 3612.3 [I.V.:1922.3; NG/GT:1290; IV Piggyback:400] Out: 3750 [Urine:3750] Intake/Output from this shift:    Labs:  Recent Labs  04/12/14 0345 04/13/14 0400 04/14/14 0355  WBC 18.3* 14.2* 14.2*  HGB 9.6* 9.0* 9.0*  PLT 542* 454* 447*  CREATININE 1.62* 1.25 1.25   Estimated Creatinine Clearance: 79.4 mL/min (by C-G formula based on Cr of 1.25).  Recent Labs  04/11/14 1030 04/11/14 2000  VANCOTROUGH 11.5 19.3    Assessment: 59 y/o male on day 6 vancomycin for MRSA PNA. He remains febrile with Tmax 102.4, WBC are down to 14.2. He had a slight bump in his SCr on 2/11 but this is now stable.  2/10 VT 19.3 - 1 g q12h continued  Vanc 2/1 >> 2/3, 2/8>> Zosyn 2/8 >>2/10  MRSA PCR negative 2/1 bld x2- ng 2/8 bld x2 - ngtd 2/8 Trach asp - MRSA (MIC <= 0.5)  Goal of Therapy:  Vancomycin trough level 15-20 mcg/ml  Plan:  Continue vancomcyin 1 g IV q12h Trough tomorrow at 09:30 Follow renal function, culture data, clinical progress  [facility] Surgical CenterJennifer [place], [address] BoulevardPharm.D., BCPS Clinical Pharmacist Pager: [phone] 04/14/2014 7:55 AM

## 2014-04-15 ENCOUNTER — Inpatient Hospital Stay (HOSPITAL_COMMUNITY): Payer: Medicare Other

## 2014-04-15 LAB — POCT I-STAT 3, ART BLOOD GAS (G3+)
Acid-Base Excess: 5 mmol/L — ABNORMAL HIGH (ref 0.0–2.0)
BICARBONATE: 29.9 meq/L — AB (ref 20.0–24.0)
O2 Saturation: 97 %
PCO2 ART: 47 mmHg — AB (ref 35.0–45.0)
Patient temperature: 98
TCO2: 31 mmol/L (ref 0–100)
pH, Arterial: 7.411 (ref 7.350–7.450)
pO2, Arterial: 86 mmHg (ref 80.0–100.0)

## 2014-04-15 LAB — CBC
HCT: 30.2 % — ABNORMAL LOW (ref 39.0–52.0)
HEMOGLOBIN: 8.9 g/dL — AB (ref 13.0–17.0)
MCH: 28.9 pg (ref 26.0–34.0)
MCHC: 29.5 g/dL — AB (ref 30.0–36.0)
MCV: 98.1 fL (ref 78.0–100.0)
Platelets: 380 10*3/uL (ref 150–400)
RBC: 3.08 MIL/uL — ABNORMAL LOW (ref 4.22–5.81)
RDW: 18 % — AB (ref 11.5–15.5)
WBC: 14.1 10*3/uL — AB (ref 4.0–10.5)

## 2014-04-15 LAB — BASIC METABOLIC PANEL
Anion gap: 7 (ref 5–15)
BUN: 72 mg/dL — ABNORMAL HIGH (ref 6–23)
CO2: 30 mmol/L (ref 19–32)
Calcium: 7.6 mg/dL — ABNORMAL LOW (ref 8.4–10.5)
Chloride: 113 mmol/L — ABNORMAL HIGH (ref 96–112)
Creatinine, Ser: 1.22 mg/dL (ref 0.50–1.35)
GFR calc non Af Amer: 64 mL/min — ABNORMAL LOW (ref >90)
GFR, EST AFRICAN AMERICAN: 74 mL/min — AB (ref >90)
Glucose, Bld: 125 mg/dL — ABNORMAL HIGH (ref 70–99)
Potassium: 3.6 mmol/L (ref 3.5–5.1)
Sodium: 150 mmol/L — ABNORMAL HIGH (ref 135–145)

## 2014-04-15 LAB — CULTURE, BLOOD (ROUTINE X 2)
Culture: NO GROWTH
Culture: NO GROWTH

## 2014-04-15 LAB — CARBOXYHEMOGLOBIN
Carboxyhemoglobin: 1.6 % — ABNORMAL HIGH (ref 0.5–1.5)
Methemoglobin: 0.7 % (ref 0.0–1.5)
O2 SAT: 71 %
TOTAL HEMOGLOBIN: 8.2 g/dL — AB (ref 13.5–18.0)

## 2014-04-15 LAB — GLUCOSE, CAPILLARY
Glucose-Capillary: 130 mg/dL — ABNORMAL HIGH (ref 70–99)
Glucose-Capillary: 202 mg/dL — ABNORMAL HIGH (ref 70–99)

## 2014-04-15 LAB — VANCOMYCIN, TROUGH: Vancomycin Tr: 18.7 ug/mL (ref 10.0–20.0)

## 2014-04-15 MED ORDER — FREE WATER
200.0000 mL | Freq: Three times a day (TID) | Status: DC
  Administered 2014-04-15 – 2014-04-16 (×3): 200 mL

## 2014-04-15 NOTE — Progress Notes (Signed)
ANTIBIOTIC CONSULT NOTE - FOLLOW UP  Pharmacy Consult for Vancomycin  Indication: MRSA HCAP  No Known Allergies  Patient Measurements: Height: 5\' 11"  (180.3 cm) Weight: 231 lb 0.7 oz (104.8 kg) IBW/kg (Calculated) : 75.3  Vital Signs: Temp: 98 F (36.7 C) (02/14 0818) Temp Source: Oral (02/14 0818) BP: 91/56 mmHg (02/14 1015) Pulse Rate: 81 (02/14 1030) Intake/Output from previous day: 02/13 0701 - 02/14 0700 In: 4218.8 [I.V.:1818.8; NG/GT:1900; IV Piggyback:500] Out: 1940 [Urine:1940] Intake/Output from this shift: Total I/O In: 787.8 [I.V.:237.8; NG/GT:550] Out: 350 [Urine:350]  Labs:  Recent Labs  04/13/14 0400 04/14/14 0355 04/15/14 0410  WBC 14.2* 14.2* 14.1*  HGB 9.0* 9.0* 8.9*  PLT 454* 447* 380  CREATININE 1.25 1.25 1.22   Estimated Creatinine Clearance: 81.3 mL/min (by C-G formula based on Cr of 1.22).  Recent Labs  04/15/14 0925  VANCOTROUGH 18.7    Assessment: 59 y/o male on day 7 vancomycin for MRSA PNA. Fever curve has trended down with Tmax 100.2, WBC are down to 14.1. Worsening oxygenation and increased secretions noted. He had a slight bump in his SCr on 2/11 but this is now stable. Vancomycin trough is therapeutic at 18.7.   2/10 VT 19.3 - 1 g q12h continued  Vanc 2/1 >> 2/3, 2/8>> Zosyn 2/8 >>2/10  MRSA PCR negative 2/1 bld x2- ng 2/8 bld x2 - ngtd 2/8 Trach asp - MRSA (MIC <= 0.5)  Goal of Therapy:  Vancomycin trough level 15-20 mcg/ml  Plan:  Continue vancomcyin 1 g IV q12h Follow renal function, culture data, clinical progress Trough as clinically indicated  [facility] HospitalJennifer [place], [address]Pharm.D., BCPS Clinical Pharmacist Pager: [phone] 04/15/2014 10:42 AM

## 2014-04-15 NOTE — Progress Notes (Signed)
RT bag-levaged pt at 2000 and 0000 and was able to suction large amounts of tenacious tan sputum. Pt's sats responded well and peak pressures decreased.

## 2014-04-15 NOTE — Progress Notes (Signed)
Patient ID: Micheal Ayala, male   DOB: 04/29/1955, 59 y.o.   MRN: [id number]  Advanced Heart Failure Rounding Note   Subjective:    Impella removed 2/5.  Off norepinephrine 2/5.   Echo (2/7) with EF 25-30%, peri-apical akinesis, no LV thrombus noted, normal RV.   Failed paralytic wean on 2/13. Remains intubated/paralyzed in setting of severe MRSA PNA and ARDS. FiO2 back up to 100%. Remains on low-dose Norepi. Lasix held due to hypernatremia.  Creatinine stable. Co-ox ok.  NA = 150  CXR with diffuse infiltrates .    Objective:   Weight Range:  Vital Signs:   Temp:  [98 F (36.7 C)-100.2 F (37.9 C)] 98 F (36.7 C) (02/14 0818) Pulse Rate:  [30-99] 86 (02/14 0900) Resp:  [0-28] 22 (02/14 0900) BP: (85-98)/(43-59) 94/50 mmHg (02/14 0818) SpO2:  [58 %-99 %] 98 % (02/14 0900) Arterial Line BP: (85-132)/(41-64) 86/45 mmHg (02/14 0900) FiO2 (%):  [60 %-80 %] 70 % (02/14 0818) Weight:  [104.8 kg (231 lb 0.7 oz)] 104.8 kg (231 lb 0.7 oz) (02/14 0500) Last BM Date: 04/14/14  Weight change: Filed Weights   04/13/14 0415 04/14/14 0351 04/15/14 0500  Weight: 104.4 kg (230 lb 2.6 oz) 105 kg (231 lb 7.7 oz) 104.8 kg (231 lb 0.7 oz)    Intake/Output:   Intake/Output Summary (Last 24 hours) at 04/15/14 0956 Last data filed at 04/15/14 0700  Gross per 24 hour  Intake 3734.61 ml  Output   1690 ml  Net 2044.61 ml     Physical Exam: General:  Intubated. Paralyzed HEENT: normal except for R eye enucleation  OGT with dark drainage Neck: supple.Carotids 2+ bilat; no bruits.  JVP flat Cor: PMI laterally displaced. Regular tachy  +s3 Lungs: rhonchi Abdomen: soft, nontender, + distended. No hepatosplenomegaly. No bruits or masses. Hypoactive bowel sounds. Extremities: no cyanosis, clubbing, rash, no edema. Neuro: awake on vent. agitated  Telemetry: Sinus tach 100  Labs: Basic Metabolic Panel:  Recent Labs Lab 04/09/14 0400 04/10/14 0459 04/11/14 0330 04/12/14 0345  04/13/14 0400 04/14/14 0355 04/14/14 1104 04/15/14 0410  NA 140 142 143 143 149* 150*  --  150*  K 3.7 3.2* 3.6 4.3 3.6 3.1*  --  3.6  CL 103 103 106 107 110 111  --  113*  CO2 --  30  GLUCOSE 108* 127* 128* 156* 129* 125*  --  125*  BUN 40* 41* 53* 83* 75* 70*  --  72*  CREATININE 1.06 1.07 1.18 1.62* 1.25 1.25  --  1.22  CALCIUM 7.5* 7.5* 7.6* 7.2* 7.7* 7.4*  --  7.6*  MG 2.5 2.6* 2.8*  --  3.1*  --   --   --   PHOS 4.1 3.1 2.2*  --   --   --  3.4  --     Liver Function Tests:  Recent Labs Lab 04/09/14 0400 04/13/14 0400  AST 100* 91*  ALT 148* 63*  ALKPHOS 288* 319*  BILITOT 3.1* 1.4*  PROT 6.1 6.0  ALBUMIN 2.0* 1.5*   No results for input(s): LIPASE, AMYLASE in the last 168 hours. No results for input(s): AMMONIA in the last 168 hours.  CBC:  Recent Labs Lab 04/09/14 0400 04/11/14 0330 04/12/14 0345 04/13/14 0400 04/14/14 0355 04/15/14 0410  WBC 12.8* 14.6* 18.3* 14.2* 14.2* 14.1*  NEUTROABS 9.9*  --   --   --   --   --   HGB 10.0* 9.5*  9.6* 9.0* 9.0* 8.9*  HCT 30.6* 30.3* 31.4* 30.4* 31.0* 30.2*  MCV 90.3 91.3 92.1 95.3 96.3 98.1  PLT 197 390 542* 454* 447* 380    Cardiac Enzymes: No results for input(s): CKTOTAL, CKMB, CKMBINDEX, TROPONINI in the last 168 hours.  BNP: BNP (last 3 results) No results for input(s): PROBNP in the last 8760 hours.   Other results:    Imaging: Dg Chest Portable 1 View  04/15/2014   CLINICAL DATA:  ARDS.  EXAM: PORTABLE CHEST - 1 VIEW  COMPARISON:  04/14/2014  FINDINGS: Endotracheal tube is in place with tip 4.0 cm above carina. Nasogastric tube is in place, tip off the film but beyond the gastroesophageal junction. Left-sided PICC line tip overlies the level of the superior vena cava.  There are bilateral pleural effusions. Dense airspace on opacities are identified bilaterally with little interval change. The hemidiaphragms are obscured as before.  IMPRESSION: 1. Stable lines and tubes. 2.  Persistent and stable bilateral airspace filling opacities and bilateral pleural effusions.   Electronically Signed   By: Norva PavlovElizabeth  Brown M.D.   On: 04/15/2014 08:10   Dg Chest Port 1 View  04/14/2014   CLINICAL DATA:  ARDS  EXAM: PORTABLE CHEST - 1 VIEW  COMPARISON:  04/13/2014  FINDINGS: Endotracheal tube tip is between the clavicular heads and carina. Left subclavian central line, tip at the SVC. A gastric suction tube tip is in the distal stomach.  Unchanged diffuse lung opacity. Hazy appearance of the lower chest compatible with layering pleural effusions. No air leak. Normal heart size and mediastinal contours. Coronary stent noted.  IMPRESSION: 1. Stable positioning of tubes and central line. 2. Unchanged edema and layering pleural effusions.   Electronically Signed   By: Marnee SpringJonathon  Watts M.D.   On: 04/14/2014 07:21     Medications:     Scheduled Medications: . antiseptic oral rinse  7 mL Mouth Rinse QID  . artificial tears  1 application Both Eyes 3 times per day  . aspirin  81 mg Per Tube Daily  . atorvastatin  80 mg Per Tube q1800  . chlorhexidine  15 mL Mouth Rinse BID  . digoxin  0.25 mg Intravenous Daily  . docusate  100 mg Oral BID  . feeding supplement (PRO-STAT SUGAR FREE 64)  60 mL Per Tube BID  . feeding supplement (VITAL HIGH PROTEIN)  1,000 mL Per Tube Q24H  . free water  200 mL Per Tube 3 times per day  . heparin subcutaneous  5,000 Units Subcutaneous Q8H  . insulin aspart  0-15 Units Subcutaneous 6 times per day  . insulin glargine  35 Units Subcutaneous Daily  . ipratropium  0.5 mg Nebulization Q6H  . levalbuterol  0.63 mg Nebulization Q6H  . multivitamin  5 mL Per Tube Daily  . pantoprazole sodium  40 mg Per Tube Q24H  . QUEtiapine  100 mg Oral BID  . sodium chloride  3 mL Intravenous Q12H  . ticagrelor  90 mg Per Tube BID  . vancomycin  1,000 mg Intravenous Q12H    Infusions: . sodium chloride 1,000 mL (03/24/2014 0700)  . amiodarone 30 mg/hr (04/15/14 0412)   . cisatracurium (NIMBEX) infusion 4 mcg/kg/min (04/15/14 0800)  . fentaNYL infusion INTRAVENOUS 350 mcg/hr (04/15/14 0747)  . midazolam (VERSED) infusion 3 mg/hr (04/15/14 0802)  . norepinephrine (LEVOPHED) Adult infusion 5 mcg/min (04/15/14 0900)    PRN Medications: sodium chloride, sodium chloride, Place/Maintain arterial line **AND** sodium chloride, acetaminophen, bisacodyl, fentaNYL, Influenza  vac split quadrivalent PF, levalbuterol, midazolam, ondansetron (ZOFRAN) IV, pneumococcal 23 valent vaccine, sennosides, sodium chloride, sorbitol   Assessment:   1. Cardiogenic shock    --s/p Impella placement 1/31, Impella out 2/5.  2. Acute on chronic systolic HF    --EF 10% on echo    --Repeat echo (2/7) with EF 25-30%, peri-apical akinesis, no LV thrombus, normal RV 3. Anterolateral STEMI 1/31   --due to stent occlusion   --s/p PCI with DES to LAD and OM-2 on 1/31   --s/p PCI with DES to RCA 2/5 4. VDRF 5. CAD 6. Tobacco abuse ongoing 7. 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 Hypomagnesemia/hyponatremia 9. Anemia s/p 3u RBCs 10. SVT: Amiodarone started 11. MRSA Pneumonia, hospital acquired 12. Acute kidney injury 13. Hypernatremia  Plan/Discussion:    Main issue now is severe MRSA PNA and ARDS. Oxygenation worsening again now back on0% FiO2. Will continue vancomycin and vent support. Prognosis increasingly concerning. Will need trach if/when more stable   With hypernatremia will hold lasix and start free water via TFs.   HF and co-ox currently stable. Continue norepi for BP support. Continue ASA, statin, brilinta.   The patient is critically ill with multiple organ systems failure and requires high complexity decision making for assessment and support, frequent evaluation and titration of therapies, application of advanced monitoring technologies and extensive interpretation of multiple databases.   Critical Care Time devoted to patient care services  described in this note is 35 Minutes.   Length of Stay: 14 Arvilla Meres MD 04/15/2014, 9:56 AM  Advanced Heart Failure Team Pager [phone] (M-F; 7a - 4p)  Please contact [facility] for night-coverage after hours (4p -7a ) and weekends on [email or web address]

## 2014-04-15 NOTE — Progress Notes (Signed)
PULMONARY / CRITICAL CARE MEDICINE   Name: Micheal Ayala MRN: [id number] DOB: [birth year]    ADMISSION DATE:  03/08/2014 CONSULTATION DATE:  03/23/2014  REFERRING MD :  Dr. Herbie Baltimore  CHIEF COMPLAINT:  STEMI  INITIAL PRESENTATION:  59 yo smoker with STEMI, cardiogenic shock, VDRF.  He is visiting [place] from [place].  STUDIES:  1/31 Cardiac cath >> severe CAD with occlusion of LAD, circumflex, RCS, ischemic CM >> DES to LAD, circumflex 1/31 Echo >> LVEF 20-25%, severely reduced systolic function 2/04 LHC > DES RCA 2/06 Echo >> EF 25 to 30%, mod LVH, grade 2 diastolic dysfx  SIGNIFICANT EVENTS: 1/31 admit, cardiac cath, impella 2/01 TCTS consulted, transfuse 1 unit PRBC 2/05 impella removed; ?upper GI bleeding  2/08 increased respiratory secretions 2/10 ARDS protocol, added levophed 2/11 start paralytic 2/12 - tried off nimbex, liberated to 7cc/kg   SUBJECTIVE:  Did not tolerate nimbex d/c yesterday.  Now back on paralytic, worsening oxygenation.  On 100%, peep 14.  Increased secretions.   VITAL SIGNS: Temp:  [98 F (36.7 C)-100.2 F (37.9 C)] 98 F (36.7 C) (02/14 0818) Pulse Rate:  [30-99] 94 (02/14 0818) Resp:  [0-28] 22 (02/14 0700) BP: (85-98)/(43-59) 94/50 mmHg (02/14 0818) SpO2:  [58 %-99 %] 91 % (02/14 0818) Arterial Line BP: (85-132)/(41-64) 90/52 mmHg (02/14 0700) FiO2 (%):  [60 %-80 %] 70 % (02/14 0818) Weight:  [231 lb 0.7 oz (104.8 kg)] 231 lb 0.7 oz (104.8 kg) (02/14 0500) HEMODYNAMICS: CVP:  [13 mmHg-20 mmHg] 13 mmHg VENTILATOR SETTINGS: Vent Mode:  [-] PRVC FiO2 (%):  [60 %-80 %] 70 % Set Rate:  [22 bmp-24 bmp] 22 bmp Vt Set:  [520 mL] 520 mL PEEP:  [14 cmH20] 14 cmH20 Plateau Pressure:  [30 cmH20-33 cmH20] 33 cmH20 INTAKE / OUTPUT:  Intake/Output Summary (Last 24 hours) at 04/15/14 0845 Last data filed at 04/15/14 0700  Gross per 24 hour  Intake 4011.71 ml  Output   1940 ml  Net 2071.71 ml   PHYSICAL EXAMINATION:  Gen: paralyzed, critically ill  appearing  HEENT: Rt eye enucleated, ETT in place, copious ETT secretions  PULM: resps even on vent, coarse, diminished bases  CV: irregular AB: decreased bowel sounds, soft Ext: 1+ BLE edema  Neuro: RASS -5, paralyzed  LABS:  CBC  Recent Labs Lab 04/13/14 0400 04/14/14 0355 04/15/14 0410  WBC 14.2* 14.2* 14.1*  HGB 9.0* 9.0* 8.9*  HCT 30.4* 31.0* 30.2*  PLT 454* 447* 380   Coag's No results for input(s): APTT, INR in the last 168 hours. BMET  Recent Labs Lab 04/13/14 0400 04/14/14 0355 04/15/14 0410  NA 149* 150* 150*  K 3.6 3.1* 3.6  CL 110 111 113*  CO2 BUN 75* 70* 72*  CREATININE 1.25 1.25 1.22  GLUCOSE 129* 125* 125*   Electrolytes  Recent Labs Lab 04/10/14 0459 04/11/14 0330  04/13/14 0400 04/14/14 0355 04/14/14 1104 04/15/14 0410  CALCIUM 7.5* 7.6*  < > 7.7* 7.4*  --  7.6*  MG 2.6* 2.8*  --  3.1*  --   --   --   PHOS 3.1 2.2*  --   --   --  3.4  --   < > = values in this interval not displayed. ABG  Recent Labs Lab 04/12/14 0425 04/13/14 0411 04/14/14 0336  PHART 7.337* 7.407 7.399  PCO2ART 52.7* 45.6* 47.9*  PO2ART 88.6 80.9 101.0*   Glucose  Recent Labs Lab 04/13/14 2343 04/14/14 0336 04/14/14 0754  04/14/14 1155 04/14/14 1634 04/14/14 1941  GLUCAP 153* 117* 151* 180* 103* 159*   ASSESSMENT / PLAN:  PULMONARY ETT 1/31>>> A:  Acute respiratory failure due to cardiogenic shock and pulmonary edema.   MRSA HCAP 2/08 with severe ARDS. Tobacco abuse. P:   Full vent support >> will likely need trach but vent needs remain too high for trach Cont ARDS protocol  Liberalize Vt to 7cc/kg 2/13 F/u ABG now Consider re-attempt wean off paralytic 2/15 Scheduled BD's >> changed to atrovent and xopenex in setting of tachycardia F/u CXR Cont attempts at gentle diuresis as bp and Scr tol - hold 2/14 with hypernatremia and hypotension  Consider FOB with increased hypoxia, copious purulent secretions, ?repeat BAL Postural  drainage, bed percussion   CARDIOVASCULAR L Impella 1/31>>>2/5 Lt [place] CVL 2/08 >> A:  Cardiogenic shock post STEMI on admission - echo 2/7 EF 25-30%.  STEMI r/t stent occlusion, now s/p DES to LAD and RCA Septic shock developed from MRSA PNA 2/10. SVT, A fib with RVR. Cvp=13 2/14 P: Amiodarone, brilinta per cardiology Hold aldactone for now Continue ASA, lipitor, digoxin per cardiology Cont hold lasix 2/14 - would like to diurese but Na 150, hypotensive  Monitor off pressors - BP borderline may need to restart   RENAL A:  AKI in setting of cardiogenic/septic shock. Hypokalemia. Hypernatremia  P:   Monitor BMET and UOP Replace electrolytes as needed Increase free water 2/14   GASTROINTESTINAL A:   Nutrition. Constipation. ?upper GI bleed 2/05 >> no further episodes since. P:   protonix BID Bowel regimen  HEMATOLOGIC A:  Thrombocytopenia >> likely from impella >> resolved. Anemia of critical illness. P:  F/u CBC  SQ heparin for DVT prevention  INFECTIOUS A: MRSA HCAP. P:    Vanc 2/8>>>  Blood 2/08 >>ng Sputum 2/08 >> MRSA  ENDOCRINE A:   Hyperglycemia. P:   SSI with lantus  NEUROLOGIC A:  Acute encephalopathy 2nd to respiratory failure, cardiogenic shock. P:   RASS goal -5 while on nimbex, lower goal to -3 once off Did not tol off nimbex 2/13 - will leave on today with worsening oxygenation and consider trial off again 2/14  UPDATES:  No family available 2/14 - need to cont ongoing discussions regarding plan and goals of care.  Will need trach but FIO2 needs too high right now.  Will likely be several more days before vent requirements amendable to trach.  Will discuss with family 2/15   Dirk Dress, NP 04/15/2014  8:45 AM Pager: [phone] or [phone]  ATTENDING NOTE: I have personally reviewed patient's available data, including medical history, events of note, physical examination and test results as part of my evaluation. I have  discussed with resident/NP and other careteam providers such as pharmacist, RN and RRT & co-ordinated with consultants. In addition, I personally evaluated patient and elicited key history of STEMI, cardiogenic shock, impella out 2/5, MRSA pna, ARDS. paralysed x d4, exam findings of BIS 40, BL air entry +, peak pr 34 on PEEP 14, secretions mod & labs showing acceptable ABG, hypernatreimia.   Diuresis limited by pressors Attempt off paralytic again 2/15 Daughter desires trach eventually  Rest per NP/medical resident whose note is outlined above and that I agree with and edited in full.   Care during the described time interval was provided by me and/or other providers on the critical care team.  I have reviewed this patient's available data, including medical history, events of note, physical examination  and test results as part of my evaluation  CC time x 4510m  Oretha MilchALVA,Babara Buffalo V. MD

## 2014-04-15 NOTE — Progress Notes (Signed)
Able to wean Norepi off, but noticed that patient does not do well when turned to right side as his BP drops very quickly after the turn.

## 2014-04-16 ENCOUNTER — Inpatient Hospital Stay (HOSPITAL_COMMUNITY): Payer: Medicare Other

## 2014-04-16 LAB — CBC
HCT: 31 % — ABNORMAL LOW (ref 39.0–52.0)
Hemoglobin: 9 g/dL — ABNORMAL LOW (ref 13.0–17.0)
MCH: 28.3 pg (ref 26.0–34.0)
MCHC: 29 g/dL — AB (ref 30.0–36.0)
MCV: 97.5 fL (ref 78.0–100.0)
PLATELETS: 422 10*3/uL — AB (ref 150–400)
RBC: 3.18 MIL/uL — ABNORMAL LOW (ref 4.22–5.81)
RDW: 18.4 % — ABNORMAL HIGH (ref 11.5–15.5)
WBC: 14.7 10*3/uL — AB (ref 4.0–10.5)

## 2014-04-16 LAB — GLUCOSE, CAPILLARY
Glucose-Capillary: 110 mg/dL — ABNORMAL HIGH (ref 70–99)
Glucose-Capillary: 128 mg/dL — ABNORMAL HIGH (ref 70–99)
Glucose-Capillary: 173 mg/dL — ABNORMAL HIGH (ref 70–99)

## 2014-04-16 LAB — BASIC METABOLIC PANEL
Anion gap: 12 (ref 5–15)
BUN: 74 mg/dL — AB (ref 6–23)
CO2: 29 mmol/L (ref 19–32)
CREATININE: 1.18 mg/dL (ref 0.50–1.35)
Calcium: 7.2 mg/dL — ABNORMAL LOW (ref 8.4–10.5)
Chloride: 110 mmol/L (ref 96–112)
GFR, EST AFRICAN AMERICAN: 77 mL/min — AB (ref >90)
GFR, EST NON AFRICAN AMERICAN: 66 mL/min — AB (ref >90)
GLUCOSE: 183 mg/dL — AB (ref 70–99)
Potassium: 3.3 mmol/L — ABNORMAL LOW (ref 3.5–5.1)
Sodium: 151 mmol/L — ABNORMAL HIGH (ref 135–145)

## 2014-04-16 MED ORDER — FREE WATER
250.0000 mL | Freq: Four times a day (QID) | Status: DC
  Administered 2014-04-16 – 2014-04-17 (×5): 250 mL

## 2014-04-16 MED ORDER — POTASSIUM CHLORIDE 20 MEQ/15ML (10%) PO SOLN
40.0000 meq | Freq: Three times a day (TID) | ORAL | Status: AC
  Administered 2014-04-16 (×2): 40 meq
  Filled 2014-04-16 (×2): qty 30

## 2014-04-16 MED ORDER — FUROSEMIDE 10 MG/ML IJ SOLN
5.0000 mg/h | INTRAVENOUS | Status: DC
  Administered 2014-04-16: 5 mg/h via INTRAVENOUS
  Filled 2014-04-16: qty 25

## 2014-04-16 MED ORDER — METOLAZONE 5 MG PO TABS
5.0000 mg | ORAL_TABLET | Freq: Every day | ORAL | Status: AC
  Administered 2014-04-16: 5 mg via ORAL
  Filled 2014-04-16: qty 1

## 2014-04-16 MED ORDER — POTASSIUM CHLORIDE 10 MEQ/50ML IV SOLN
10.0000 meq | INTRAVENOUS | Status: AC
  Administered 2014-04-16 (×4): 10 meq via INTRAVENOUS
  Filled 2014-04-16 (×4): qty 50

## 2014-04-16 MED ORDER — DEXTROSE 5 % IV SOLN
INTRAVENOUS | Status: DC
  Administered 2014-04-16: 10:00:00 via INTRAVENOUS

## 2014-04-16 NOTE — Progress Notes (Addendum)
Patient ID: Micheal Ayala, male   DOB: 09/19/1955, 59 y.o.   MRN: [id number]  Advanced Heart Failure Rounding Note   Subjective:    Impella removed 2/5.  Off norepinephrine 2/5.   Echo (2/7) with EF 25-30%, peri-apical akinesis, no LV thrombus noted, normal RV.   Failed paralytic wean on 2/13. Remains intubated/paralyzed in setting of severe MRSA PNA and ARDS. Spiked fever overnight.  Remains on low-dose Norepi @ 2. Lasix held due to hypernatremia.  Creatinine stable. Co-ox ok.  NA = 151  CXR with diffuse infiltrates .    Objective:   Weight Range:  Vital Signs:   Temp:  [98.6 F (37 C)-100.4 F (38 C)] 100.4 F (38 C) (02/15 0700) Pulse Rate:  [56-98] 90 (02/15 0700) Resp:  [22] 22 (02/15 0700) BP: (80-102)/(46-60) 102/59 mmHg (02/15 0700) SpO2:  [90 %-98 %] 93 % (02/15 0819) Arterial Line BP: (86-116)/(45-58) 110/57 mmHg (02/15 0700) FiO2 (%):  [60 %-80 %] 80 % (02/15 0826) Weight:  [108.3 kg (238 lb 12.1 oz)] 108.3 kg (238 lb 12.1 oz) (02/15 0500) Last BM Date: 04/14/14  Weight change: Filed Weights   04/14/14 0351 04/15/14 0500 04/16/14 0500  Weight: 105 kg (231 lb 7.7 oz) 104.8 kg (231 lb 0.7 oz) 108.3 kg (238 lb 12.1 oz)    Intake/Output:   Intake/Output Summary (Last 24 hours) at 04/16/14 0846 Last data filed at 04/16/14 0700  Gross per 24 hour  Intake 4104.69 ml  Output   1425 ml  Net 2679.69 ml     Physical Exam: General:  Intubated. Paralyzed on oscillator HEENT: normal except for R eye enucleation  OGT with dark drainage Neck: supple.Carotids 2+ bilat; no bruits.  JVP flat Cor: PMI laterally displaced. Regular tachy Lungs: rhonchi Abdomen: soft, nontender, + distended. No hepatosplenomegaly. No bruits or masses. Hypoactive bowel sounds. Extremities: no cyanosis, clubbing, rash, tr edema. Neuro: awake on vent. agitated  Telemetry: Sinus 90-100  Labs: Basic Metabolic Panel:  Recent Labs Lab 04/10/14 0459 04/11/14 0330 04/12/14 0345  04/13/14 0400 04/14/14 0355 04/14/14 1104 04/15/14 0410 04/16/14 0400  NA 142 143 143 149* 150*  --  150* 151*  K 3.2* 3.6 4.3 3.6 3.1*  --  3.6 3.3*  CL 103 106 107 110 111  --  113* 110  CO2 28 29 29 29 31   --  30 29  GLUCOSE 127* 128* 156* 129* 125*  --  125* 183*  BUN 41* 53* 83* 75* 70*  --  72* 74*  CREATININE 1.07 1.18 1.62* 1.25 1.25  --  1.22 1.18  CALCIUM 7.5* 7.6* 7.2* 7.7* 7.4*  --  7.6* 7.2*  MG 2.6* 2.8*  --  3.1*  --   --   --   --   PHOS 3.1 2.2*  --   --   --  3.4  --   --     Liver Function Tests:  Recent Labs Lab 04/13/14 0400  AST 91*  ALT 63*  ALKPHOS 319*  BILITOT 1.4*  PROT 6.0  ALBUMIN 1.5*   No results for input(s): LIPASE, AMYLASE in the last 168 hours. No results for input(s): AMMONIA in the last 168 hours.  CBC:  Recent Labs Lab 04/12/14 0345 04/13/14 0400 04/14/14 0355 04/15/14 0410 04/16/14 0400  WBC 18.3* 14.2* 14.2* 14.1* 14.7*  HGB 9.6* 9.0* 9.0* 8.9* 9.0*  HCT 31.4* 30.4* 31.0* 30.2* 31.0*  MCV 92.1 95.3 96.3 98.1 97.5  PLT 542* 454* 447* 380 422*  Cardiac Enzymes: No results for input(s): CKTOTAL, CKMB, CKMBINDEX, TROPONINI in the last 168 hours.  BNP: BNP (last 3 results) No results for input(s): PROBNP in the last 8760 hours.   Other results:    Imaging: Dg Chest Port 1 View  04/16/2014   CLINICAL DATA:  Adult respiratory distress syndrome  EXAM: PORTABLE CHEST - 1 VIEW  COMPARISON:  04/15/2014  FINDINGS: ET tube position is unchanged, a little below the clavicular heads. Nasogastric tube extends below the diaphragm and off the inferior edge of the image. The left subclavian central line extends into the SVC just below the azygos vein junction.  Airspace opacities persist bilaterally without significant interval change. Effusions persist bilaterally, unchanged on the left and possibly reduced on the right although some of this difference could be positional.  IMPRESSION: Unchanged airspace opacities. Question  reduction in right pleural fluid although some of this difference might be positional.   Electronically Signed   By: Ellery Plunk M.D.   On: 04/16/2014 04:35   Dg Chest Portable 1 View  04/15/2014   CLINICAL DATA:  ARDS.  EXAM: PORTABLE CHEST - 1 VIEW  COMPARISON:  04/14/2014  FINDINGS: Endotracheal tube is in place with tip 4.0 cm above carina. Nasogastric tube is in place, tip off the film but beyond the gastroesophageal junction. Left-sided PICC line tip overlies the level of the superior vena cava.  There are bilateral pleural effusions. Dense airspace on opacities are identified bilaterally with little interval change. The hemidiaphragms are obscured as before.  IMPRESSION: 1. Stable lines and tubes. 2. Persistent and stable bilateral airspace filling opacities and bilateral pleural effusions.   Electronically Signed   By: Norva Pavlov M.D.   On: 04/15/2014 08:10     Medications:     Scheduled Medications: . antiseptic oral rinse  7 mL Mouth Rinse QID  . artificial tears  1 application Both Eyes 3 times per day  . aspirin  81 mg Per Tube Daily  . atorvastatin  80 mg Per Tube q1800  . chlorhexidine  15 mL Mouth Rinse BID  . digoxin  0.25 mg Intravenous Daily  . docusate  100 mg Oral BID  . feeding supplement (PRO-STAT SUGAR FREE 64)  60 mL Per Tube BID  . feeding supplement (VITAL HIGH PROTEIN)  1,000 mL Per Tube Q24H  . free water  200 mL Per Tube 3 times per day  . heparin subcutaneous  5,000 Units Subcutaneous Q8H  . insulin aspart  0-15 Units Subcutaneous 6 times per day  . insulin glargine  35 Units Subcutaneous Daily  . ipratropium  0.5 mg Nebulization Q6H  . levalbuterol  0.63 mg Nebulization Q6H  . multivitamin  5 mL Per Tube Daily  . pantoprazole sodium  40 mg Per Tube Q24H  . QUEtiapine  100 mg Oral BID  . sodium chloride  3 mL Intravenous Q12H  . ticagrelor  90 mg Per Tube BID  . vancomycin  1,000 mg Intravenous Q12H    Infusions: . sodium chloride 1,000 mL  (03/08/2014 0700)  . amiodarone 30 mg/hr (04/16/14 0358)  . cisatracurium (NIMBEX) infusion 3.5 mcg/kg/min (04/16/14 0700)  . fentaNYL infusion INTRAVENOUS 325 mcg/hr (04/16/14 0014)  . midazolam (VERSED) infusion 2 mg/hr (04/15/14 1900)  . norepinephrine (LEVOPHED) Adult infusion 2 mcg/min (04/16/14 0700)    PRN Medications: sodium chloride, sodium chloride, Place/Maintain arterial line **AND** sodium chloride, acetaminophen, bisacodyl, fentaNYL, Influenza vac split quadrivalent PF, levalbuterol, midazolam, ondansetron (ZOFRAN) IV, pneumococcal 23 valent  vaccine, sennosides, sodium chloride, sorbitol   Assessment:   1. Cardiogenic shock    --s/p Impella placement 1/31, Impella out 2/5.  2. Acute on chronic systolic HF    --EF 10% on echo    --Repeat echo (2/7) with EF 25-30%, peri-apical akinesis, no LV thrombus, normal RV 3. Anterolateral STEMI 1/31   --due to stent occlusion   --s/p PCI with DES to LAD and OM-2 on 1/31   --s/p PCI with DES to RCA 2/5 4. VDRF 5. CAD 6. Tobacco abuse ongoing 7. 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 Hypomagnesemia/hyponatremia 9. Anemia s/p 3u RBCs 10. SVT: Amiodarone started 11. MRSA Pneumonia, hospital acquired 12. Acute kidney injury 13. Hypernatremia  Plan/Discussion:    Main issue now is severe MRSA PNA and ARDS. Now on 80% FiO2.  Will continue vancomycin and vent support. Prognosis increasingly concerning. Will need trach if/when more stable   With hypernatremia will hold lasix and start free water via TFs. Will supp K+  HF currently stable. Continue norepi for BP support. Continue ASA, statin, brilinta.   The patient is critically ill with multiple organ systems failure and requires high complexity decision making for assessment and support, frequent evaluation and titration of therapies, application of advanced monitoring technologies and extensive interpretation of multiple databases.   Critical Care Time devoted  to patient care services described in this note is 35 Minutes.   Length of Stay: 15 Arvilla Meres MD 04/16/2014, 8:46 AM  Advanced Heart Failure Team Pager [phone] (M-F; 7a - 4p)  Please contact [facility] for night-coverage after hours (4p -7a ) and weekends on [email or web address]

## 2014-04-16 NOTE — Progress Notes (Signed)
PULMONARY / CRITICAL CARE MEDICINE   Name: Micheal Ayala MRN: [id number] DOB: August 10, 1955    ADMISSION DATE:  03/07/2014 CONSULTATION DATE:  03/14/2014  REFERRING MD :  Dr. Herbie Baltimore  CHIEF COMPLAINT:  STEMI  INITIAL PRESENTATION:  59 yo smoker with STEMI, cardiogenic shock, VDRF.  Micheal Ayala is visiting [place] from [place].  STUDIES:  1/31 Cardiac cath >> severe CAD with occlusion of LAD, circumflex, RCS, ischemic CM >> DES to LAD, circumflex 1/31 Echo >> LVEF 20-25%, severely reduced systolic function 2/04 LHC > DES RCA 2/06 Echo >> EF 25 to 30%, mod LVH, grade 2 diastolic dysfx  SIGNIFICANT EVENTS: 1/31 admit, cardiac cath, impella 2/01 TCTS consulted, transfuse 1 unit PRBC 2/05 impella removed; ?upper GI bleeding  2/08 increased respiratory secretions 2/10 ARDS protocol, added levophed 2/11 start paralytic 2/12 - tried off nimbex, liberated to 7cc/kg   SUBJECTIVE:  Did not tolerate nimbex d/c yesterday.  Now back on paralytic, worsening oxygenation.  On 100%, peep 14.  Increased secretions.   VITAL SIGNS: Temp:  [98.6 F (37 C)-100.4 F (38 C)] 100.4 F (38 C) (02/15 0700) Pulse Rate:  [56-98] 91 (02/15 0800) Resp:  [22] 22 (02/15 0800) BP: (80-102)/(46-63) 98/63 mmHg (02/15 0800) SpO2:  [90 %-98 %] 93 % (02/15 0819) Arterial Line BP: (91-116)/(47-58) 109/58 mmHg (02/15 0800) FiO2 (%):  [60 %-80 %] 80 % (02/15 0826) Weight:  [108.3 kg (238 lb 12.1 oz)] 108.3 kg (238 lb 12.1 oz) (02/15 0500) HEMODYNAMICS: CVP:  [13 mmHg-18 mmHg] 15 mmHg VENTILATOR SETTINGS: Vent Mode:  [-] PRVC FiO2 (%):  [60 %-80 %] 80 % Set Rate:  [22 bmp] 22 bmp Vt Set:  [420 mL-520 mL] 420 mL PEEP:  [14 cmH20] 14 cmH20 Plateau Pressure:  [30 cmH20-33 cmH20] 31 cmH20 INTAKE / OUTPUT:  Intake/Output Summary (Last 24 hours) at 04/16/14 0904 Last data filed at 04/16/14 0900  Gross per 24 hour  Intake 4307.59 ml  Output   1775 ml  Net 2532.59 ml   PHYSICAL EXAMINATION:  Gen: paralyzed, critically  ill appearing  HEENT: Rt eye enucleated, ETT in place, copious ETT secretions  PULM: resps even on vent, coarse, diminished bases  CV: irregular AB: decreased bowel sounds, soft Ext: 1+ BLE edema  Neuro: RASS -5, paralyzed  LABS:  CBC  Recent Labs Lab 04/14/14 0355 04/15/14 0410 04/16/14 0400  WBC 14.2* 14.1* 14.7*  HGB 9.0* 8.9* 9.0*  HCT 31.0* 30.2* 31.0*  PLT 447* 380 422*   Coag's No results for input(s): APTT, INR in the last 168 hours. BMET  Recent Labs Lab 04/14/14 0355 04/15/14 0410 04/16/14 0400  NA 150* 150* 151*  K 3.1* 3.6 3.3*  CL 111 113* 110  CO2 BUN 70* 72* 74*  CREATININE 1.25 1.22 1.18  GLUCOSE 125* 125* 183*   Electrolytes  Recent Labs Lab 04/10/14 0459 04/11/14 0330  04/13/14 0400 04/14/14 0355 04/14/14 1104 04/15/14 0410 04/16/14 0400  CALCIUM 7.5* 7.6*  < > 7.7* 7.4*  --  7.6* 7.2*  MG 2.6* 2.8*  --  3.1*  --   --   --   --   PHOS 3.1 2.2*  --   --   --  3.4  --   --   < > = values in this interval not displayed. ABG  Recent Labs Lab 04/13/14 0411 04/14/14 0336 04/15/14 0914  PHART 7.407 7.399 7.411  PCO2ART 45.6* 47.9* 47.0*  PO2ART 80.9 101.0* 86.0   Glucose  Recent Labs Lab 04/14/14 1941 04/15/14 0713 04/15/14 1203 04/15/14 2006 04/15/14 2352 04/16/14 0347  GLUCAP 159* 130* 202* 128* 110* 173*   ASSESSMENT / PLAN:  PULMONARY ETT 1/31>>> A:  Acute respiratory failure due to cardiogenic shock and pulmonary edema.   MRSA HCAP 2/08 with severe ARDS. Tobacco abuse. P:   Full vent support >> will likely need trach but vent needs remain too high for trach, would like to see at 50-60% FiO2 with PEEP<8 prior to trach. Cont ARDS protocol. F/u ABG now. Continue paralytics today. Scheduled BD's >> changed to atrovent and xopenex in setting of tachycardia F/u CXR. Low dose lasix drip today. Postural drainage, bed percussion   CARDIOVASCULAR L Impella 1/31>>>2/5 Lt [place] CVL 2/08 >> A:  Cardiogenic  shock post STEMI on admission - echo 2/7 EF 25-30%.  STEMI r/t stent occlusion, now s/p DES to LAD and RCA Septic shock developed from MRSA PNA 2/10. SVT, A fib with RVR. Cvp=13 2/14 P: Amiodarone, brilinta per cardiology Hold aldactone for now Continue ASA, lipitor, digoxin per cardiology Titrate levophed for MAP  RENAL A:  AKI in setting of cardiogenic/septic shock. Hypokalemia. Hypernatremia  P:   Monitor BMET and UOP. Replace electrolytes as needed. Increase free water to 250 ml q6 hrs. Lasix drip 5 mg/hr x24 hours. Zaroxolyn 5 mg PO x1. D5W 50 ml/hr x24 hours.  GASTROINTESTINAL A:   Nutrition. Constipation. ?upper GI bleed 2/05 >> no further episodes since. P:   Protonix BID. Bowel regimen. TF.  HEMATOLOGIC A:  Thrombocytopenia >> likely from impella >> resolved. Anemia of critical illness. P:  F/u CBC  SQ heparin for DVT prevention  INFECTIOUS A: MRSA HCAP. P:    Vanc 2/8>>>  Blood 2/08 >>ng Sputum 2/08 >> MRSA  ENDOCRINE A:   Hyperglycemia. P:   SSI with lantus  NEUROLOGIC A:  Acute encephalopathy 2nd to respiratory failure, cardiogenic shock. P:   RASS goal -5 while on nimbex, lower goal to -3 once off Did not tol off nimbex 2/13 - will leave on today with worsening oxygenation and consider trial off again 2/14  UPDATES:  No family available 2/15 - Need to discuss trach/peg.   The patient is critically ill with multiple organ systems failure and requires high complexity decision making for assessment and support, frequent evaluation and titration of therapies, application of advanced monitoring technologies and extensive interpretation of multiple databases.   Critical Care Time devoted to patient care services described in this note is  35  Minutes. This time reflects time of care of this signee Dr Koren BoundWesam Yacoub. This critical care time does not reflect procedure time, or teaching time or supervisory time of PA/NP/Med student/Med Resident  etc but could involve care discussion time.  Alyson ReedyWesam G. Yacoub, M.D. [facility]. Pager: [phone]. After hours pager: [phone].

## 2014-04-17 ENCOUNTER — Inpatient Hospital Stay (HOSPITAL_COMMUNITY): Payer: Medicare Other

## 2014-04-17 LAB — BASIC METABOLIC PANEL
ANION GAP: 12 (ref 5–15)
BUN: 76 mg/dL — AB (ref 6–23)
CO2: 28 mmol/L (ref 19–32)
Calcium: 7.3 mg/dL — ABNORMAL LOW (ref 8.4–10.5)
Chloride: 110 mmol/L (ref 96–112)
Creatinine, Ser: 1.31 mg/dL (ref 0.50–1.35)
GFR, EST AFRICAN AMERICAN: 68 mL/min — AB (ref >90)
GFR, EST NON AFRICAN AMERICAN: 58 mL/min — AB (ref >90)
Glucose, Bld: 151 mg/dL — ABNORMAL HIGH (ref 70–99)
Potassium: 2.8 mmol/L — ABNORMAL LOW (ref 3.5–5.1)
Sodium: 150 mmol/L — ABNORMAL HIGH (ref 135–145)

## 2014-04-17 LAB — GLUCOSE, CAPILLARY
GLUCOSE-CAPILLARY: 151 mg/dL — AB (ref 70–99)
GLUCOSE-CAPILLARY: 130 mg/dL — AB (ref 70–99)
Glucose-Capillary: 152 mg/dL — ABNORMAL HIGH (ref 70–99)
Glucose-Capillary: 151 mg/dL — ABNORMAL HIGH (ref 70–99)
Glucose-Capillary: 195 mg/dL — ABNORMAL HIGH (ref 70–99)
Glucose-Capillary: 118 mg/dL — ABNORMAL HIGH (ref 70–99)
Glucose-Capillary: 114 mg/dL — ABNORMAL HIGH (ref 70–99)

## 2014-04-17 LAB — BLOOD GAS, ARTERIAL
ACID-BASE EXCESS: 6.1 mmol/L — AB (ref 0.0–2.0)
Bicarbonate: 30.5 mEq/L — ABNORMAL HIGH (ref 20.0–24.0)
Drawn by: 39866
FIO2: 0.8 %
LHR: 22 {breaths}/min
O2 SAT: 98.3 %
PATIENT TEMPERATURE: 98.6
PEEP/CPAP: 14 cmH2O
TCO2: 32 mmol/L (ref 0–100)
VT: 520 mL
pCO2 arterial: 48.2 mmHg — ABNORMAL HIGH (ref 35.0–45.0)
pH, Arterial: 7.418 (ref 7.350–7.450)
pO2, Arterial: 96.2 mmHg (ref 80.0–100.0)

## 2014-04-17 LAB — CBC
HCT: 29.6 % — ABNORMAL LOW (ref 39.0–52.0)
Hemoglobin: 8.6 g/dL — ABNORMAL LOW (ref 13.0–17.0)
MCH: 28.3 pg (ref 26.0–34.0)
MCHC: 29.1 g/dL — ABNORMAL LOW (ref 30.0–36.0)
MCV: 97.4 fL (ref 78.0–100.0)
PLATELETS: 397 10*3/uL (ref 150–400)
RBC: 3.04 MIL/uL — AB (ref 4.22–5.81)
RDW: 18.5 % — ABNORMAL HIGH (ref 11.5–15.5)
WBC: 12.4 10*3/uL — ABNORMAL HIGH (ref 4.0–10.5)

## 2014-04-17 LAB — POCT I-STAT 3, ART BLOOD GAS (G3+)
Acid-Base Excess: 3 mmol/L — ABNORMAL HIGH (ref 0.0–2.0)
Bicarbonate: 28.3 mEq/L — ABNORMAL HIGH (ref 20.0–24.0)
O2 SAT: 80 %
PCO2 ART: 49.4 mmHg — AB (ref 35.0–45.0)
PH ART: 7.366 (ref 7.350–7.450)
PO2 ART: 46 mmHg — AB (ref 80.0–100.0)
TCO2: 30 mmol/L (ref 0–100)

## 2014-04-17 LAB — MAGNESIUM: Magnesium: 2.9 mg/dL — ABNORMAL HIGH (ref 1.5–2.5)

## 2014-04-17 LAB — PHOSPHORUS: Phosphorus: 3.5 mg/dL (ref 2.3–4.6)

## 2014-04-17 MED ORDER — ACETAMINOPHEN 160 MG/5ML PO SOLN: 650.0000 mg | ORAL | Status: DC | PRN

## 2014-04-17 MED ORDER — DEXTROSE 5 % IV SOLN
INTRAVENOUS | Status: AC
  Administered 2014-04-17: 10:00:00 via INTRAVENOUS

## 2014-04-17 MED ORDER — POTASSIUM CHLORIDE 10 MEQ/50ML IV SOLN
10.0000 meq | INTRAVENOUS | Status: AC
  Administered 2014-04-17 (×6): 10 meq via INTRAVENOUS
  Filled 2014-04-17 (×6): qty 50

## 2014-04-17 MED ORDER — POTASSIUM CHLORIDE 20 MEQ/15ML (10%) PO SOLN
40.0000 meq | Freq: Three times a day (TID) | ORAL | Status: AC
  Administered 2014-04-17 (×2): 40 meq
  Filled 2014-04-17 (×2): qty 30

## 2014-04-17 MED ORDER — FREE WATER
250.0000 mL | Status: DC
  Administered 2014-04-17 – 2014-04-22 (×25): 250 mL

## 2014-04-17 MED ORDER — FUROSEMIDE 10 MG/ML IJ SOLN
8.0000 mg/h | INTRAVENOUS | Status: DC
  Administered 2014-04-17: 8 mg/h via INTRAVENOUS
  Filled 2014-04-17: qty 25

## 2014-04-17 MED ORDER — POTASSIUM CHLORIDE 20 MEQ/15ML (10%) PO SOLN
20.0000 meq | Freq: Once | ORAL | Status: AC
  Administered 2014-04-17: 20 meq
  Filled 2014-04-17: qty 15

## 2014-04-17 MED ORDER — METOLAZONE 5 MG PO TABS
5.0000 mg | ORAL_TABLET | Freq: Every day | ORAL | Status: AC
Start: 2014-04-17 — End: 2014-04-17
  Administered 2014-04-17: 5 mg via ORAL
  Filled 2014-04-17: qty 1

## 2014-04-17 NOTE — Progress Notes (Signed)
eLink Physician-Brief Progress Note Patient Name: Micheal Ayala DOB: 07/25/1955 MRN: [id number]   Date of Service  04/17/2014  HPI/Events of Note    eICU Interventions  Hypokalemia, repleted      Intervention Category Intermediate Interventions: Electrolyte abnormality - evaluation and management  Sita Mangen 04/17/2014, 6:04 AM

## 2014-04-17 NOTE — Progress Notes (Signed)
Patient ID: Micheal Ayala, male   DOB: 03/21/1955, 59 y.o.   MRN: [id number]  Advanced Heart Failure Rounding Note   Subjective:    Impella removed 2/5.  Off norepinephrine 2/5.   Echo (2/7) with EF 25-30%, peri-apical akinesis, no LV thrombus noted, normal RV.   Failed paralytic wean on 2/13. Remains intubated/paralyzed in setting of severe MRSA PNA and ARDS.  FiO2 80%. Had recurrent AF last night and IV amio restarted. Now back in NSR.  Remains on Norepi. SBP 100-110. CCM started lasix gtt with D5 due to overall volume overload but Na = 150.  Creatinine stable. K 2.6.    CXR with diffuse infiltrates and effusions. Unchanged.    Objective:   Weight Range:  Vital Signs:   Temp:  [100 F (37.8 C)-102 F (38.9 C)] 102 F (38.9 C) (02/16 0800) Pulse Rate:  [49-114] 114 (02/16 1102) Resp:  [22] 22 (02/16 1102) BP: (75-99)/(43-60) 99/43 mmHg (02/16 1102) SpO2:  [90 %-98 %] 95 % (02/16 1102) Arterial Line BP: (89-107)/(44-59) 96/52 mmHg (02/16 0800) FiO2 (%):  [70 %-80 %] 70 % (02/16 1102) Weight:  [238 lb 8.6 oz (108.2 kg)] 238 lb 8.6 oz (108.2 kg) (02/16 0428) Last BM Date: 04/14/14  Weight change: Filed Weights   04/15/14 0500 04/16/14 0500 04/17/14 0428  Weight: 231 lb 0.7 oz (104.8 kg) 238 lb 12.1 oz (108.3 kg) 238 lb 8.6 oz (108.2 kg)    Intake/Output:   Intake/Output Summary (Last 24 hours) at 04/17/14 1149 Last data filed at 04/17/14 1018  Gross per 24 hour  Intake 4636.58 ml  Output   4660 ml  Net -23.42 ml     Physical Exam: General:  Intubated. 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   Cor: PMI laterally displaced. Regular tachy Lungs: rhonchi Abdomen: soft, nontender, + distended. No hepatosplenomegaly. No bruits or masses. Hypoactive bowel sounds. Extremities: no cyanosis, clubbing, rash, 1+ edema. Neuro: awake on vent. agitated  Telemetry: Sinus 90-100. AF  overnight  Labs: Basic Metabolic Panel:  Recent Labs Lab 04/11/14 0330  04/13/14 0400 04/14/14 0355 04/14/14 1104 04/15/14 0410 04/16/14 0400 04/17/14 0420  NA 143  < > 149* 150*  --  150* 151* 150*  K 3.6  < > 3.6 3.1*  --  3.6 3.3* 2.8*  CL 106  < > 110 111  --  113* 110 110  CO2 29  < > 29 31  --  30 29 28   GLUCOSE 128*  < > 129* 125*  --  125* 183* 151*  BUN 53*  < > 75* 70*  --  72* 74* 76*  CREATININE 1.18  < > 1.25 1.25  --  1.22 1.18 1.31  CALCIUM 7.6*  < > 7.7* 7.4*  --  7.6* 7.2* 7.3*  MG 2.8*  --  3.1*  --   --   --   --  2.9*  PHOS 2.2*  --   --   --  3.4  --   --  3.5  < > = values in this interval not displayed.  Liver Function Tests:  Recent Labs Lab 04/13/14 0400  AST 91*  ALT 63*  ALKPHOS 319*  BILITOT 1.4*  PROT 6.0  ALBUMIN 1.5*   No results for input(s): LIPASE, AMYLASE in the last 168 hours. No results for input(s): AMMONIA in the last 168 hours.  CBC:  Recent Labs Lab 04/13/14  0400 04/14/14 0355 04/15/14 0410 04/16/14 0400 04/17/14 0420  WBC 14.2* 14.2* 14.1* 14.7* 12.4*  HGB 9.0* 9.0* 8.9* 9.0* 8.6*  HCT 30.4* 31.0* 30.2* 31.0* 29.6*  MCV 95.3 96.3 98.1 97.5 97.4  PLT 454* 447* 380 422* 397    Cardiac Enzymes: No results for input(s): CKTOTAL, CKMB, CKMBINDEX, TROPONINI in the last 168 hours.  BNP: BNP (last 3 results) No results for input(s): PROBNP in the last 8760 hours.   Other results:    Imaging: Dg Chest Port 1 View  04/17/2014   CLINICAL DATA:  Assess endotracheal tube  EXAM: PORTABLE CHEST - 1 VIEW  COMPARISON:  04/16/2014  FINDINGS: There is an endotracheal tube with tip at the clavicular heads. Gastric suction tube at least reaches the stomach. Stable left subclavian central line, tip at the SVC level.  Diffuse opacification of the chest consistent with pulmonary edema and layering pleural effusions. Superimposed pneumonia or atelectasis cannot be excluded. No evidence of pneumothorax.  IMPRESSION: 1. Unchanged  pulmonary edema and layering pleural effusions. 2. Above could obscure superimposed pneumonia or atelectasis.   Electronically Signed   By: Marnee Spring M.D.   On: 04/17/2014 07:05   Dg Chest Port 1 View  04/16/2014   CLINICAL DATA:  Adult respiratory distress syndrome  EXAM: PORTABLE CHEST - 1 VIEW  COMPARISON:  04/15/2014  FINDINGS: ET tube position is unchanged, a little below the clavicular heads. Nasogastric tube extends below the diaphragm and off the inferior edge of the image. The left subclavian central line extends into the SVC just below the azygos vein junction.  Airspace opacities persist bilaterally without significant interval change. Effusions persist bilaterally, unchanged on the left and possibly reduced on the right although some of this difference could be positional.  IMPRESSION: Unchanged airspace opacities. Question reduction in right pleural fluid although some of this difference might be positional.   Electronically Signed   By: Ellery Plunk M.D.   On: 04/16/2014 04:35     Medications:     Scheduled Medications: . antiseptic oral rinse  7 mL Mouth Rinse QID  . artificial tears  1 application Both Eyes 3 times per day  . aspirin  81 mg Per Tube Daily  . atorvastatin  80 mg Per Tube q1800  . chlorhexidine  15 mL Mouth Rinse BID  . digoxin  0.25 mg Intravenous Daily  . docusate  100 mg Oral BID  . feeding supplement (PRO-STAT SUGAR FREE 64)  60 mL Per Tube BID  . feeding supplement (VITAL HIGH PROTEIN)  1,000 mL Per Tube Q24H  . free water  250 mL Per Tube Q4H  . heparin subcutaneous  5,000 Units Subcutaneous Q8H  . insulin aspart  0-15 Units Subcutaneous 6 times per day  . insulin glargine  35 Units Subcutaneous Daily  . ipratropium  0.5 mg Nebulization Q6H  . levalbuterol  0.63 mg Nebulization Q6H  . multivitamin  5 mL Per Tube Daily  . pantoprazole sodium  40 mg Per Tube Q24H  . potassium chloride  10 mEq Intravenous Q1 Hr x 6  . potassium chloride  40  mEq Per Tube TID  . QUEtiapine  100 mg Oral BID  . sodium chloride  3 mL Intravenous Q12H  . ticagrelor  90 mg Per Tube BID  . vancomycin  1,000 mg Intravenous Q12H    Infusions: . sodium chloride 1,000 mL (03/23/2014 0700)  . amiodarone 60 mg/hr (04/17/14 0646)  . cisatracurium (NIMBEX) infusion 2 mcg/kg/min (  04/17/14 1015)  . dextrose 50 mL/hr at 04/17/14 1016  . fentaNYL infusion INTRAVENOUS 350 mcg/hr (04/17/14 1020)  . furosemide (LASIX) infusion 8 mg/hr (04/17/14 1014)  . midazolam (VERSED) infusion 6 mg/hr (04/17/14 1049)  . norepinephrine (LEVOPHED) Adult infusion 6 mcg/min (04/17/14 1046)    PRN Medications: sodium chloride, sodium chloride, Place/Maintain arterial line **AND** sodium chloride, acetaminophen (TYLENOL) oral liquid 160 mg/5 mL, bisacodyl, fentaNYL, Influenza vac split quadrivalent PF, levalbuterol, midazolam, ondansetron (ZOFRAN) IV, pneumococcal 23 valent vaccine, sennosides, sodium chloride, sorbitol   Assessment:   1. Cardiogenic shock    --s/p Impella placement 1/31, Impella out 2/5.  2. Acute on chronic systolic HF    --EF 10% on echo    --Repeat echo (2/7) with EF 25-30%, peri-apical akinesis, no LV thrombus, normal RV 3. Anterolateral STEMI 1/31   --due to stent occlusion   --s/p PCI with DES to LAD and OM-2 on 1/31   --s/p PCI with DES to RCA 2/5 4. VDRF 5. CAD 6. Tobacco abuse ongoing 7. 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 Hypomagnesemia/hyponatremia 9. Anemia s/p 3u RBCs 10. SVT: Amiodarone started 11. MRSA Pneumonia, hospital acquired 12. Acute kidney injury 13. Hypernatremia  Plan/Discussion:    Main issue now is severe MRSA PNA and ARDS. Now on 80% FiO2.  Will continue vancomycin and vent support. Prognosis increasingly concerning. Will need trach if/when more stable   Management of hypernatremia per CCM.  Back on IV amio for AF.   HF currently stable. Continue norepi for BP support. Continue ASA, statin,  brilinta.   The patient is critically ill with multiple organ systems failure and requires high complexity decision making for assessment and support, frequent evaluation and titration of therapies, application of advanced monitoring technologies and extensive interpretation of multiple databases.   Critical Care Time devoted to patient care services described in this note is 35 Minutes.   Length of Stay: 16 Arvilla Meres MD 04/17/2014, 11:49 AM  Advanced Heart Failure Team Pager [phone] (M-F; 7a - 4p)  Please contact [facility] for night-coverage after hours (4p -7a ) and weekends on [email or web address]

## 2014-04-17 NOTE — Progress Notes (Signed)
PULMONARY / CRITICAL CARE MEDICINE   Name: Micheal Ayala MRN: [id number] DOB: 02/19/1956    ADMISSION DATE:  03/05/2014 CONSULTATION DATE:  03/25/2014  REFERRING MD :  Dr. Herbie BaltimoreHarding  CHIEF COMPLAINT:  STEMI  INITIAL PRESENTATION:  59 yo smoker with STEMI, cardiogenic shock, VDRF.  He is visiting [place] from [place].  STUDIES:  1/31 Cardiac cath >> severe CAD with occlusion of LAD, circumflex, RCS, ischemic CM >> DES to LAD, circumflex 1/31 Echo >> LVEF 20-25%, severely reduced systolic function 2/04 LHC > DES RCA 2/06 Echo >> EF 25 to 30%, mod LVH, grade 2 diastolic dysfx  SIGNIFICANT EVENTS: 1/31 admit, cardiac cath, impella 2/01 TCTS consulted, transfuse 1 unit PRBC 2/05 impella removed; ?upper GI bleeding  2/08 increased respiratory secretions 2/10 ARDS protocol, added levophed 2/11 start paralytic 2/12 - tried off nimbex, liberated to 7cc/kg   SUBJECTIVE:  Did not tolerate nimbex d/c yesterday.  Now back on paralytic, worsening oxygenation.  On 100%, peep 14.  Increased secretions.   VITAL SIGNS: Temp:  [100 F (37.8 C)-102 F (38.9 C)] 102 F (38.9 C) (02/16 0800) Pulse Rate:  [49-106] 78 (02/16 0800) Resp:  [22] 22 (02/16 0800) BP: (75-99)/(46-60) 75/53 mmHg (02/16 0700) SpO2:  [86 %-98 %] 97 % (02/16 0800) Arterial Line BP: (89-107)/(44-59) 96/52 mmHg (02/16 0800) FiO2 (%):  [80 %] 80 % (02/16 0800) Weight:  [108.2 kg (238 lb 8.6 oz)] 108.2 kg (238 lb 8.6 oz) (02/16 0428) HEMODYNAMICS: CVP:  [11 mmHg-17 mmHg] 11 mmHg VENTILATOR SETTINGS: Vent Mode:  [-] PRVC FiO2 (%):  [80 %] 80 % Set Rate:  [22 bmp] 22 bmp Vt Set:  [420 mL-520 mL] 520 mL PEEP:  [14 cmH20] 14 cmH20 Plateau Pressure:  [30 cmH20-35 cmH20] 30 cmH20 INTAKE / OUTPUT:  Intake/Output Summary (Last 24 hours) at 04/17/14 0956 Last data filed at 04/17/14 0800  Gross per 24 hour  Intake 5220.68 ml  Output   4935 ml  Net 285.68 ml   PHYSICAL EXAMINATION:  Gen: paralyzed, critically ill appearing   HEENT: Rt eye enucleated, ETT in place, copious ETT secretions  PULM: resps even on vent, coarse, diminished bases  CV: irregular AB: decreased bowel sounds, soft Ext: 1+ BLE edema  Neuro: RASS -5, paralyzed  LABS:  CBC  Recent Labs Lab 04/15/14 0410 04/16/14 0400 04/17/14 0420  WBC 14.1* 14.7* 12.4*  HGB 8.9* 9.0* 8.6*  HCT 30.2* 31.0* 29.6*  PLT 380 422* 397   Coag's No results for input(s): APTT, INR in the last 168 hours. BMET  Recent Labs Lab 04/15/14 0410 04/16/14 0400 04/17/14 0420  NA 150* 151* 150*  K 3.6 3.3* 2.8*  CL 113* 110 110  CO2 30 29 28   BUN 72* 74* 76*  CREATININE 1.22 1.18 1.31  GLUCOSE 125* 183* 151*   Electrolytes  Recent Labs Lab 04/11/14 0330  04/13/14 0400  04/14/14 1104 04/15/14 0410 04/16/14 0400 04/17/14 0420  CALCIUM 7.6*  < > 7.7*  < >  --  7.6* 7.2* 7.3*  MG 2.8*  --  3.1*  --   --   --   --  2.9*  PHOS 2.2*  --   --   --  3.4  --   --  3.5  < > = values in this interval not displayed. ABG  Recent Labs Lab 04/14/14 0336 04/15/14 0914 04/17/14 0455  PHART 7.399 7.411 7.418  PCO2ART 47.9* 47.0* 48.2*  PO2ART 101.0* 86.0 96.2   Glucose  Recent  Labs Lab 04/15/14 2352 04/16/14 0347 04/16/14 1930 04/16/14 2316 04/17/14 0423 04/17/14 0828  GLUCAP 110* 173* 152* 151* 151* 130*   ASSESSMENT / PLAN:  PULMONARY ETT 1/31>>> A:  Acute respiratory failure due to cardiogenic shock and pulmonary edema.   MRSA HCAP 2/08 with severe ARDS. Tobacco abuse. P:   Full vent support >> will likely need trach but vent needs remain too high for trach, would like to see at 50-60% FiO2 with PEEP<8 prior to trach. Decrease PEEP to 12, FiO2 to 70 and RR to 24 Cont ARDS protocol. Attempt to wean paralytics to off today. Scheduled BD's >> changed to atrovent and xopenex in setting of tachycardia F/u CXR. Lasix drip as below. Postural drainage, bed percussion   CARDIOVASCULAR L Impella 1/31>>>2/5 Lt [place] CVL 2/08 >> A:   Cardiogenic shock post STEMI on admission - echo 2/7 EF 25-30%.  STEMI r/t stent occlusion, now s/p DES to LAD and RCA Septic shock developed from MRSA PNA 2/10. SVT, A fib with RVR. Cvp=13 2/14 P: Amiodarone, brilinta per cardiology Hold aldactone for now Continue ASA, lipitor, digoxin per cardiology Titrate levophed for MAP  RENAL A:  AKI in setting of cardiogenic/septic shock. Hypokalemia. Hypernatremia  P:   Monitor BMET and UOP. Replace electrolytes as needed. Increase free water to 250 ml q4 hrs. Increase Lasix drip 8 mg/hr x24 hours. Zaroxolyn 5 mg PO x1. D5W 50 ml/hr for another 24 hours.  GASTROINTESTINAL A:   Nutrition. Constipation. ?upper GI bleed 2/05 >> no further episodes since. P:   Protonix BID. Bowel regimen. TF.  HEMATOLOGIC A:  Thrombocytopenia >> likely from impella >> resolved. Anemia of critical illness. P:  F/u CBC  SQ heparin for DVT prevention  INFECTIOUS A: MRSA HCAP. Febrile overnight. P:    Vanc 2/8>>>  Blood 2/08 >>ng Sputum 2/08 >> MRSA  If continues to be febrile then will reculture and add cefepime in AM, WBC is dropping however.  ENDOCRINE A:   Hyperglycemia. P:   SSI with lantus  NEUROLOGIC A:  Acute encephalopathy 2nd to respiratory failure, cardiogenic shock. P:   RASS goal -5 while on nimbex, lower goal to -3 once off Did not tol off nimbex 2/13 - will leave on today with worsening oxygenation and consider trial off again 2/14  UPDATES:  No family available 2/16 - Need to discuss trach/peg, RN to call for family meeting to determine plan of care.   The patient is critically ill with multiple organ systems failure and requires high complexity decision making for assessment and support, frequent evaluation and titration of therapies, application of advanced monitoring technologies and extensive interpretation of multiple databases.   Critical Care Time devoted to patient care services described in this note  is  35  Minutes. This time reflects time of care of this signee Dr Koren Bound. This critical care time does not reflect procedure time, or teaching time or supervisory time of PA/NP/Med student/Med Resident etc but could involve care discussion time.  Alyson Reedy, M.D. [facility] Pulmonary/Critical Care Medicine. Pager: [phone]. After hours pager: [phone].

## 2014-04-17 NOTE — Progress Notes (Signed)
NUTRITION FOLLOW-UP  INTERVENTION:  Needs bowel regimen, discussed with RN  Continue: Vital High Protein to 39m/hr  645mProStat BID  Provides 1600 kcals (66% of needs), 165 grams protein, 1003 ml H20 Total free water: 2503 ml   NUTRITION DIAGNOSIS: Inadequate oral intake related to unable to eat as evidenced by NPO status; ongoing.   Goal: Enteral nutrition to provide 60-70% of estimated calorie needs (22-25 kcals/kg ideal body weight) and 100% of estimated protein needs, based on ASPEN guidelines for hypocaloric, high protein feeding in critically ill obese individuals; met.   Monitor:  TF tolerance, PO status, weight, labs    ASSESSMENT: Micheal Ayala male [place]oker [place], CAD s/p STEMI 2002, was presented to ED via EMS with severe chest pain, hypertension and shortness of breath. He underwent emergency left and right heart catheterization and placement of a Impella CP ventricular assist device via femoral artery, removed 2/5. Occluded prior stents have been replaced with new stents.   Patient is currently intubated on ventilator support MV: 11.9 L/min Temp (24hrs), Avg:100.6 F (38.1 C), Min:100 F (37.8 C), Max:102 F (38.9 C)  Potassium low on replacement.  Sodium elevated, free water increased.  Per MD respiratory status worsening due to MRSA PNA and ARDS. Pt remains paralyzed and has not yet tolerated being weaned from paralytics.   [name] was 2/13. Discussed with RN today need for bowel regimen. Pt is on colace BID and has dulcolax ordered daily prn. Per RN pt is distended.   Pt receiving Vital High Protein @ 50 ml/hr with 60 ml prostat BID via OG tube.  Tube feeding regimen provides 1600 kcal (66% of needs), 165 grams of protein, and 1003 ml of H2O.  250 ml H2O every 4 hours Total free water: 2503 ml  Height: Ht Readings from Last 1 Encounters:  04/11/14 _0  (1.803 m)    Weight: Wt Readings from Last 1 Encounters:   04/17/14 238 lb 8.6 oz (108.2 kg)  Admission weight: 218 lb (99.1 kg) 1/31  BMI:  Body mass index is 33.28 kg/(m^2). obese  Estimated Nutritional Needs: Kcal: 2435 Protein: 155-175 grams Fluid: 1.8 L daily  Skin: intact  Diet Order:     Intake/Output Summary (Last 24 hours) at 04/17/14 1126 Last data filed at 04/17/14 1018  Gross per 24 hour  Intake 4636.58 ml  Output   4660 ml  Net -23.42 ml    Last BM: 2/13  Labs:   Recent Labs Lab 04/11/14 0330  04/13/14 0400  04/14/14 1104 04/15/14 0410 04/16/14 0400 04/17/14 0420  NA 143  < > 149*  < >  --  150* 151* 150*  K 3.6  < > 3.6  < >  --  3.6 3.3* 2.8*  CL 106  < > 110  < >  --  113* 110 110  CO2 29  < > 29  < >  --  _1 BUN 53*  < > 75*  < >  --  72* 74* 76*  CREATININE 1.18  < > 1.25  < >  --  1.22 1.18 1.31  CALCIUM 7.6*  < > 7.7*  < >  --  7.6* 7.2* 7.3*  MG 2.8*  --  3.1*  --   --   --   --  2.9*  PHOS 2.2*  --   --   --  3.4  --   --  3.5  GLUCOSE  128*  < > 129*  < >  --  125* 183* 151*  < > = values in this interval not displayed.  CBG (last 3)   Recent Labs  04/16/14 2316 04/17/14 0423 04/17/14 0828  GLUCAP 151* 151* 130*    Scheduled Meds: . antiseptic oral rinse  7 mL Mouth Rinse QID  . artificial tears  1 application Both Eyes 3 times per day  . aspirin  81 mg Per Tube Daily  . atorvastatin  80 mg Per Tube q1800  . chlorhexidine  15 mL Mouth Rinse BID  . digoxin  0.25 mg Intravenous Daily  . docusate  100 mg Oral BID  . feeding supplement (PRO-STAT SUGAR FREE 64)  60 mL Per Tube BID  . feeding supplement (VITAL HIGH PROTEIN)  1,000 mL Per Tube Q24H  . free water  250 mL Per Tube Q4H  . heparin subcutaneous  5,000 Units Subcutaneous Q8H  . insulin aspart  0-15 Units Subcutaneous 6 times per day  . insulin glargine  35 Units Subcutaneous Daily  . ipratropium  0.5 mg Nebulization Q6H  . levalbuterol  0.63 mg Nebulization Q6H  . metolazone  5 mg Oral Daily  . multivitamin  5 mL  Per Tube Daily  . pantoprazole sodium  40 mg Per Tube Q24H  . potassium chloride  10 mEq Intravenous Q1 Hr x 6  . potassium chloride  40 mEq Per Tube TID  . QUEtiapine  100 mg Oral BID  . sodium chloride  3 mL Intravenous Q12H  . ticagrelor  90 mg Per Tube BID  . vancomycin  1,000 mg Intravenous Q12H    Continuous Infusions: . sodium chloride 1,000 mL (03/28/2014 0700)  . amiodarone 60 mg/hr (04/17/14 0646)  . cisatracurium (NIMBEX) infusion 2 mcg/kg/min (04/17/14 1015)  . dextrose 50 mL/hr at 04/17/14 1016  . fentaNYL infusion INTRAVENOUS 350 mcg/hr (04/17/14 1020)  . furosemide (LASIX) infusion 8 mg/hr (04/17/14 1014)  . midazolam (VERSED) infusion 6 mg/hr (04/17/14 1049)  . norepinephrine (LEVOPHED) Adult infusion 6 mcg/min (04/17/14 1046)   [place], [place], CNSC [phone] Pager [phone] After Hours Pager

## 2014-04-18 ENCOUNTER — Inpatient Hospital Stay (HOSPITAL_COMMUNITY): Payer: Medicare Other

## 2014-04-18 LAB — BASIC METABOLIC PANEL
Anion gap: 6 (ref 5–15)
BUN: 84 mg/dL — AB (ref 6–23)
CO2: 36 mmol/L — ABNORMAL HIGH (ref 19–32)
CREATININE: 1.25 mg/dL (ref 0.50–1.35)
Calcium: 7.7 mg/dL — ABNORMAL LOW (ref 8.4–10.5)
Chloride: 105 mmol/L (ref 96–112)
GFR calc Af Amer: 72 mL/min — ABNORMAL LOW (ref >90)
GFR calc non Af Amer: 62 mL/min — ABNORMAL LOW (ref >90)
GLUCOSE: 134 mg/dL — AB (ref 70–99)
Potassium: 2.4 mmol/L — CL (ref 3.5–5.1)
SODIUM: 147 mmol/L — AB (ref 135–145)

## 2014-04-18 LAB — BLOOD GAS, ARTERIAL
Acid-Base Excess: 9 mmol/L — ABNORMAL HIGH (ref 0.0–2.0)
Bicarbonate: 33.3 mEq/L — ABNORMAL HIGH (ref 20.0–24.0)
Drawn by: 40415
FIO2: 0.8 %
O2 SAT: 97.7 %
PEEP: 14 cmH2O
PH ART: 7.452 — AB (ref 7.350–7.450)
Patient temperature: 98.6
RATE: 24 resp/min
TCO2: 34.7 mmol/L (ref 0–100)
VT: 520 mL
pCO2 arterial: 48.3 mmHg — ABNORMAL HIGH (ref 35.0–45.0)
pO2, Arterial: 96.8 mmHg (ref 80.0–100.0)

## 2014-04-18 LAB — GLUCOSE, CAPILLARY
GLUCOSE-CAPILLARY: 121 mg/dL — AB (ref 70–99)
GLUCOSE-CAPILLARY: 138 mg/dL — AB (ref 70–99)
Glucose-Capillary: 168 mg/dL — ABNORMAL HIGH (ref 70–99)
Glucose-Capillary: 123 mg/dL — ABNORMAL HIGH (ref 70–99)

## 2014-04-18 LAB — CBC
HEMATOCRIT: 31.8 % — AB (ref 39.0–52.0)
Hemoglobin: 9.3 g/dL — ABNORMAL LOW (ref 13.0–17.0)
MCH: 27.9 pg (ref 26.0–34.0)
MCHC: 29.2 g/dL — AB (ref 30.0–36.0)
MCV: 95.5 fL (ref 78.0–100.0)
Platelets: 356 10*3/uL (ref 150–400)
RBC: 3.33 MIL/uL — ABNORMAL LOW (ref 4.22–5.81)
RDW: 18.2 % — AB (ref 11.5–15.5)
WBC: 14.3 10*3/uL — ABNORMAL HIGH (ref 4.0–10.5)

## 2014-04-18 LAB — PHOSPHORUS: Phosphorus: 3.7 mg/dL (ref 2.3–4.6)

## 2014-04-18 LAB — MAGNESIUM: Magnesium: 2.3 mg/dL (ref 1.5–2.5)

## 2014-04-18 LAB — CLOSTRIDIUM DIFFICILE BY PCR: CDIFFPCR: NEGATIVE

## 2014-04-18 MED ORDER — POTASSIUM CHLORIDE 10 MEQ/50ML IV SOLN
10.0000 meq | INTRAVENOUS | Status: AC
  Administered 2014-04-18 (×2): 10 meq via INTRAVENOUS
  Filled 2014-04-18: qty 50

## 2014-04-18 MED ORDER — POTASSIUM CHLORIDE 20 MEQ/15ML (10%) PO SOLN: 40.0000 meq | Freq: Three times a day (TID) | ORAL | Status: DC

## 2014-04-18 MED ORDER — DEXTROSE 5 % IV SOLN
INTRAVENOUS | Status: AC
Start: 2014-04-18 — End: 2014-04-19
  Administered 2014-04-19: 07:00:00 via INTRAVENOUS

## 2014-04-18 MED ORDER — CEFEPIME HCL 1 G IJ SOLR
1.0000 g | Freq: Three times a day (TID) | INTRAMUSCULAR | Status: DC
  Administered 2014-04-18 – 2014-04-23 (×16): 1 g via INTRAVENOUS
  Filled 2014-04-18 (×17): qty 1

## 2014-04-18 MED ORDER — METOLAZONE 5 MG PO TABS
5.0000 mg | ORAL_TABLET | Freq: Every day | ORAL | Status: AC
  Administered 2014-04-18: 5 mg via ORAL
  Filled 2014-04-18: qty 1

## 2014-04-18 MED ORDER — POTASSIUM CHLORIDE 20 MEQ/15ML (10%) PO SOLN
40.0000 meq | Freq: Three times a day (TID) | ORAL | Status: AC
  Administered 2014-04-18 (×3): 40 meq
  Filled 2014-04-18 (×3): qty 30

## 2014-04-18 MED ORDER — FUROSEMIDE 10 MG/ML IJ SOLN
10.0000 mg/h | INTRAVENOUS | Status: AC
  Administered 2014-04-18: 10 mg/h via INTRAVENOUS
  Filled 2014-04-18 (×2): qty 25

## 2014-04-18 MED ORDER — POTASSIUM CHLORIDE 10 MEQ/50ML IV SOLN
10.0000 meq | INTRAVENOUS | Status: AC
  Administered 2014-04-18 (×5): 10 meq via INTRAVENOUS
  Filled 2014-04-18 (×6): qty 50

## 2014-04-18 MED ORDER — ALBUMIN HUMAN 25 % IV SOLN
12.5000 g | Freq: Four times a day (QID) | INTRAVENOUS | Status: AC
  Administered 2014-04-18 – 2014-04-19 (×4): 12.5 g via INTRAVENOUS
  Filled 2014-04-18 (×4): qty 50

## 2014-04-18 MED ORDER — POTASSIUM CHLORIDE 10 MEQ/50ML IV SOLN: 10.0000 meq | INTRAVENOUS | Status: AC

## 2014-04-18 NOTE — Progress Notes (Signed)
eLink Physician-Brief Progress Note Patient Name: Micheal Ayala DOB: 11/12/1955 MRN: [id number]   Date of Service  04/18/2014  HPI/Events of Note    eICU Interventions  Hypokalemia, repleted      Intervention Category Intermediate Interventions: Electrolyte abnormality - evaluation and management  Kino Dunsworth 04/18/2014, 4:53 AM

## 2014-04-18 NOTE — Progress Notes (Signed)
ANTIBIOTIC CONSULT NOTE - FOLLOW UP  Pharmacy Consult for Vancomycin, Cefepime  Indication: MRSA HCAP  No Known Allergies  Patient Measurements: Height: 5\' 11"  (180.3 cm) Weight: 231 lb 4.2 oz (104.9 kg) IBW/kg (Calculated) : 75.3  Vital Signs: Temp: 98.2 F (36.8 C) (02/17 0400) Temp Source: Axillary (02/17 0400) Pulse Rate: 78 (02/17 0806) Intake/Output from previous day: 02/16 0701 - 02/17 0700 In: 5571.9 [I.V.:2601.9; NG/GT:2270; IV Piggyback:700] Out: 6025 [Urine:4200; Emesis/NG output:1825] Intake/Output from this shift:    Labs:  Recent Labs  04/16/14 0400 04/17/14 0420 04/18/14 0335  WBC 14.7* 12.4* 14.3*  HGB 9.0* 8.6* 9.3*  PLT 422* 397 356  CREATININE 1.18 1.31 1.25   Estimated Creatinine Clearance: 79.4 mL/min (by C-G formula based on Cr of 1.25).  Recent Labs  04/15/14 0925  VANCOTROUGH 18.7    Assessment: 59 y/o male on day 9 vancomycin for MRSA PNA. Now adding Cefepime since WBC is trending up. Fever curve has trended up withTmax 102. SCr remains stable at 1.25. Repeat blood cultures have been collected    2/10 VT 19.3 - 1 g q12h continued 2/14 VT 18.7 - 1 g q12h continued   Cefepime 2/17>> Vanc 2/1 >> 2/3, 2/8>> Zosyn 2/8 >>2/10  MRSA PCR negative 2/1 bld x2- ng 2/8 bld x2 - ng 2/8 Trach asp - MRSA (MIC <= 0.5) 2/17 Bld x2 >>   Goal of Therapy:  Vancomycin trough level 15-20 mcg/ml  Plan:  Continue vancomcyin 1 g IV q12h Start Cefepime 1 gm IV Q 8 hours  Follow renal function, culture data, clinical progress Trough as clinically indicated  Vinnie LevelBenjamin Robbi Scurlock, PharmD., BCPS Clinical Pharmacist Pager [phone]

## 2014-04-18 NOTE — Progress Notes (Signed)
50 ml of fentanyl 4110mcg/ml wasted in sink. Witnessed by Isabella BowensFrances Brailynn Breth, RN and Retta MacMae Anion, RN.

## 2014-04-18 NOTE — Progress Notes (Signed)
Patient ID: Job FoundsFrederick Bounds, male   DOB: 02/26/1956, 59 y.o.   MRN: [id number]  Advanced Heart Failure Rounding Note   Subjective:    Impella removed 2/5.  Off norepinephrine 2/5.   Echo (2/7) with EF 25-30%, peri-apical akinesis, no LV thrombus noted, normal RV.   Failed paralytic wean on 2/13.   Remains intubated in setting of severe MRSA PNA and ARDS.  This am FiO2 cut back to 70% and sedation held. He is Diaphoretic and agitated but seems to respond. Sats in 80s/   Remains on Norepi 6. SBP 100-110. On lasix gtt per ccm. Renal function stable.   Maintaining NSR on amio  CXR with diffuse infiltrates and effusions. Unchanged.    Objective:   Weight Range:  Vital Signs:   Temp:  [97.4 F (36.3 C)-101.5 F (38.6 C)] 99 F (37.2 C) (02/17 0800) Pulse Rate:  [45-123] 78 (02/17 0806) Resp:  [15-27] 24 (02/17 0806) BP: (99)/(43) 99/43 mmHg (02/16 1102) SpO2:  [93 %-100 %] 99 % (02/17 0800) Arterial Line BP: (77-124)/(44-71) 109/53 mmHg (02/17 0800) FiO2 (%):  [70 %-100 %] 70 % (02/17 0806) Weight:  [104.9 kg (231 lb 4.2 oz)] 104.9 kg (231 lb 4.2 oz) (02/17 0500) Last BM Date: 04/17/14  Weight change: Filed Weights   04/16/14 0500 04/17/14 0428 04/18/14 0500  Weight: 108.3 kg (238 lb 12.1 oz) 108.2 kg (238 lb 8.6 oz) 104.9 kg (231 lb 4.2 oz)    Intake/Output:   Intake/Output Summary (Last 24 hours) at 04/18/14 0936 Last data filed at 04/18/14 0800  Gross per 24 hour  Intake 5190.62 ml  Output   5925 ml  Net -734.38 ml     Physical Exam: General:  Intubated. Agitated diaphoretic HEENT: normal except for R eye enucleation  OGT and ETT Neck: supple.Carotids 2+ bilat; no bruits.   Cor: PMI laterally displaced. Regular tachy Lungs: rhonchi Abdomen: soft, nontender, + distended. No hepatosplenomegaly. No bruits or masses. Hypoactive bowel sounds. Extremities: no cyanosis, clubbing, rash, 1+ edema. Neuro: awake on vent. agitated  Telemetry: Sinus  100-110  Labs: Basic Metabolic Panel:  Recent Labs Lab 04/13/14 0400 04/14/14 0355 04/14/14 1104 04/15/14 0410 04/16/14 0400 04/17/14 0420 04/18/14 0335  NA 149* 150*  --  150* 151* 150* 147*  K 3.6 3.1*  --  3.6 3.3* 2.8* 2.4*  CL 110 111  --  113* 110 110 105  CO2 29 31  --  30 29 28  36*  GLUCOSE 129* 125*  --  125* 183* 151* 134*  BUN 75* 70*  --  72* 74* 76* 84*  CREATININE 1.25 1.25  --  1.22 1.18 1.31 1.25  CALCIUM 7.7* 7.4*  --  7.6* 7.2* 7.3* 7.7*  MG 3.1*  --   --   --   --  2.9* 2.3  PHOS  --   --  3.4  --   --  3.5 3.7    Liver Function Tests:  Recent Labs Lab 04/13/14 0400  AST 91*  ALT 63*  ALKPHOS 319*  BILITOT 1.4*  PROT 6.0  ALBUMIN 1.5*   No results for input(s): LIPASE, AMYLASE in the last 168 hours. No results for input(s): AMMONIA in the last 168 hours.  CBC:  Recent Labs Lab 04/14/14 0355 04/15/14 0410 04/16/14 0400 04/17/14 0420 04/18/14 0335  WBC 14.2* 14.1* 14.7* 12.4* 14.3*  HGB 9.0* 8.9* 9.0* 8.6* 9.3*  HCT 31.0* 30.2* 31.0* 29.6* 31.8*  MCV 96.3 98.1 97.5 97.4 95.5  PLT  447* 380 422* 397 356    Cardiac Enzymes: No results for input(s): CKTOTAL, CKMB, CKMBINDEX, TROPONINI in the last 168 hours.  BNP: BNP (last 3 results) No results for input(s): PROBNP in the last 8760 hours.   Other results:    Imaging: Dg Chest Port 1 View  04/18/2014   CLINICAL DATA:  Intubation.  EXAM: PORTABLE CHEST - 1 VIEW  COMPARISON:  04/17/2014.  FINDINGS: Endotracheal tube, left subclavian central line, NG tube in stable position. Persistent cardiomegaly with bilateral pulmonary alveolar infiltrates again noted consistent congestive heart failure pulmonary edema. Slight improvement of pulmonary edema noted. Pneumonia cannot be excluded. Small right pleural effusion. No pneumothorax.  IMPRESSION: 1. Lines and tubes in stable position. 2. Persistent changes of congestive heart failure and pulmonary edema with slight improvement of pulmonary  edema. Small right pleural effusion.   Electronically Signed   By: Maisie Fus  Register   On: 04/18/2014 07:03   Dg Chest Port 1 View  04/17/2014   CLINICAL DATA:  Assess endotracheal tube  EXAM: PORTABLE CHEST - 1 VIEW  COMPARISON:  04/16/2014  FINDINGS: There is an endotracheal tube with tip at the clavicular heads. Gastric suction tube at least reaches the stomach. Stable left subclavian central line, tip at the SVC level.  Diffuse opacification of the chest consistent with pulmonary edema and layering pleural effusions. Superimposed pneumonia or atelectasis cannot be excluded. No evidence of pneumothorax.  IMPRESSION: 1. Unchanged pulmonary edema and layering pleural effusions. 2. Above could obscure superimposed pneumonia or atelectasis.   Electronically Signed   By: Marnee Spring M.D.   On: 04/17/2014 07:05     Medications:     Scheduled Medications: . albumin human  12.5 g Intravenous Q6H  . antiseptic oral rinse  7 mL Mouth Rinse QID  . artificial tears  1 application Both Eyes 3 times per day  . aspirin  81 mg Per Tube Daily  . atorvastatin  80 mg Per Tube q1800  . ceFEPime (MAXIPIME) IV  1 g Intravenous Q8H  . chlorhexidine  15 mL Mouth Rinse BID  . digoxin  0.25 mg Intravenous Daily  . docusate  100 mg Oral BID  . feeding supplement (PRO-STAT SUGAR FREE 64)  60 mL Per Tube BID  . feeding supplement (VITAL HIGH PROTEIN)  1,000 mL Per Tube Q24H  . free water  250 mL Per Tube Q4H  . heparin subcutaneous  5,000 Units Subcutaneous Q8H  . insulin aspart  0-15 Units Subcutaneous 6 times per day  . insulin glargine  35 Units Subcutaneous Daily  . ipratropium  0.5 mg Nebulization Q6H  . levalbuterol  0.63 mg Nebulization Q6H  . metolazone  5 mg Oral Daily  . multivitamin  5 mL Per Tube Daily  . pantoprazole sodium  40 mg Per Tube Q24H  . potassium chloride  10 mEq Intravenous Q1 Hr x 6  . potassium chloride  10 mEq Intravenous Q1 Hr x 2  . potassium chloride  40 mEq Per Tube TID  .  QUEtiapine  100 mg Oral BID  . ticagrelor  90 mg Per Tube BID  . vancomycin  1,000 mg Intravenous Q12H    Infusions: . amiodarone 60 mg/hr (04/18/14 0724)  . cisatracurium (NIMBEX) infusion Stopped (04/17/14 1115)  . dextrose 50 mL/hr at 04/17/14 1325  . fentaNYL infusion INTRAVENOUS 100 mcg/hr (04/18/14 0630)  . furosemide (LASIX) infusion 10 mg/hr (04/18/14 0832)  . midazolam (VERSED) infusion 1 mg/hr (04/18/14 0630)  . norepinephrine (  LEVOPHED) Adult infusion 7 mcg/min (04/18/14 0650)    PRN Medications: Place/Maintain arterial line **AND** sodium chloride, acetaminophen (TYLENOL) oral liquid 160 mg/5 mL, bisacodyl, fentaNYL, Influenza vac split quadrivalent PF, levalbuterol, midazolam, ondansetron (ZOFRAN) IV, pneumococcal 23 valent vaccine, sennosides, sorbitol   Assessment:   1. Cardiogenic shock    --s/p Impella placement 1/31, Impella out 2/5.  2. Acute on chronic systolic HF    --EF 10% on echo    --Repeat echo (2/7) with EF 25-30%, peri-apical akinesis, no LV thrombus, normal RV 3. Anterolateral STEMI 1/31   --due to stent occlusion   --s/p PCI with DES to LAD and OM-2 on 1/31   --s/p PCI with DES to RCA 2/5 4. VDRF 5. CAD 6. Tobacco abuse ongoing 7. 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 Hypomagnesemia/hyponatremia 9. Anemia s/p 3u RBCs 10. SVT: Amiodarone started 11. MRSA Pneumonia, hospital acquired 12. Acute kidney injury 13. Hypernatremia  Plan/Discussion:    Main issue now is severe MRSA PNA and ARDS. Failing vent wean again. Long talk with (> 45 mins) with family and CCM regarding where to go from here and plan will be to proceed with trach when more stable from respiratory standpoint (? Friday) to give him a chance to recover. They realize odds are stacked against him but they are realistic about his situation and want to give him a chance to recover, if possible.  HF currently stable. Continue norepi for BP support. Continue ASA,  statin, brilinta.   Management of hypernatremia per CCM.  Back on IV amio for AF. Will reduce rate to 30.   The patient is critically ill with multiple organ systems failure and requires high complexity decision making for assessment and support, frequent evaluation and titration of therapies, application of advanced monitoring technologies and extensive interpretation of multiple databases.   Critical Care Time devoted to patient care services described in this note is 60 Minutes.   Length of Stay: 17 Arvilla Meres MD 04/18/2014, 9:36 AM  Advanced Heart Failure Team Pager [phone] (M-F; 7a - 4p)  Please contact [facility] for night-coverage after hours (4p -7a ) and weekends on [email or web address]

## 2014-04-18 NOTE — Progress Notes (Signed)
PULMONARY / CRITICAL CARE MEDICINE   Name: Micheal Ayala MRN: [id number] DOB: 10/18/1955    ADMISSION DATE:  03/17/2014 CONSULTATION DATE:  03/06/2014  REFERRING MD :  Dr. Herbie BaltimoreHarding  CHIEF COMPLAINT:  STEMI  INITIAL PRESENTATION:  59 yo smoker with STEMI, cardiogenic shock, VDRF.  He is visiting [place] from [place].  STUDIES:  1/31 Cardiac cath >> severe CAD with occlusion of LAD, circumflex, RCS, ischemic CM >> DES to LAD, circumflex 1/31 Echo >> LVEF 20-25%, severely reduced systolic function 2/04 LHC > DES RCA 2/06 Echo >> EF 25 to 30%, mod LVH, grade 2 diastolic dysfx  SIGNIFICANT EVENTS: 1/31 admit, cardiac cath, impella 2/01 TCTS consulted, transfuse 1 unit PRBC 2/05 impella removed; ?upper GI bleeding  2/08 increased respiratory secretions 2/10 ARDS protocol, added levophed 2/11 start paralytic 2/12 - tried off nimbex, liberated to 7cc/kg   SUBJECTIVE:  Did not tolerate nimbex d/c yesterday.  Now back on paralytic, worsening oxygenation.  On 100%, peep 14.  Increased secretions.   VITAL SIGNS: Temp:  [97.4 F (36.3 C)-101.5 F (38.6 C)] 98.2 F (36.8 C) (02/17 0400) Pulse Rate:  [45-123] 73 (02/17 0733) Resp:  [15-27] 24 (02/17 0733) BP: (99)/(43) 99/43 mmHg (02/16 1102) SpO2:  [93 %-100 %] 100 % (02/17 0700) Arterial Line BP: (77-124)/(44-71) 84/50 mmHg (02/17 0700) FiO2 (%):  [70 %-100 %] 80 % (02/17 0733) Weight:  [104.9 kg (231 lb 4.2 oz)] 104.9 kg (231 lb 4.2 oz) (02/17 0500) HEMODYNAMICS: CVP:  [9 mmHg-18 mmHg] 12 mmHg VENTILATOR SETTINGS: Vent Mode:  [-] PRVC FiO2 (%):  [70 %-100 %] 80 % Set Rate:  [22 bmp-24 bmp] 24 bmp Vt Set:  [520 mL] 520 mL PEEP:  [12 cmH20-14 cmH20] 14 cmH20 Plateau Pressure:  [30 cmH20-33 cmH20] 31 cmH20 INTAKE / OUTPUT:  Intake/Output Summary (Last 24 hours) at 04/18/14 0805 Last data filed at 04/18/14 0600  Gross per 24 hour  Intake 5359.15 ml  Output   5525 ml  Net -165.85 ml   PHYSICAL EXAMINATION:  Gen: withdraws to  pain, critically ill appearing  HEENT: Rt eye enucleated, ETT in place, copious ETT secretions  PULM: resps even on vent, coarse, diminished bases  CV: irregular AB: decreased bowel sounds, soft Ext: 1+ BLE edema  Neuro: RASS -3, no longer paralyzed  LABS:  CBC  Recent Labs Lab 04/16/14 0400 04/17/14 0420 04/18/14 0335  WBC 14.7* 12.4* 14.3*  HGB 9.0* 8.6* 9.3*  HCT 31.0* 29.6* 31.8*  PLT 422* 397 356   Coag's No results for input(s): APTT, INR in the last 168 hours. BMET  Recent Labs Lab 04/16/14 0400 04/17/14 0420 04/18/14 0335  NA 151* 150* 147*  K 3.3* 2.8* 2.4*  CL 110 110 105  CO2 29 28 36*  BUN 74* 76* 84*  CREATININE 1.18 1.31 1.25  GLUCOSE 183* 151* 134*   Electrolytes  Recent Labs Lab 04/13/14 0400  04/14/14 1104  04/16/14 0400 04/17/14 0420 04/18/14 0335  CALCIUM 7.7*  < >  --   < > 7.2* 7.3* 7.7*  MG 3.1*  --   --   --   --  2.9* 2.3  PHOS  --   --  3.4  --   --  3.5 3.7  < > = values in this interval not displayed. ABG  Recent Labs Lab 04/17/14 0455 04/17/14 1151 04/18/14 0333  PHART 7.418 7.366 7.452*  PCO2ART 48.2* 49.4* 48.3*  PO2ART 96.2 46.0* 96.8   Glucose  Recent Labs Lab  04/17/14 0423 04/17/14 0828 04/17/14 1144 04/17/14 1532 04/17/14 2007 04/18/14 0759  GLUCAP 151* 130* 195* 118* 114* 121*   ASSESSMENT / PLAN:  PULMONARY ETT 1/31>>> A:  Acute respiratory failure due to cardiogenic shock and pulmonary edema.   MRSA HCAP 2/08 with severe ARDS. Tobacco abuse. P:   - Full vent support >> will need a trach, family wishes for that as well but unsafe to do right now, will attempt to get to a PEEP of 10 then will proceed with tracheostomy. - Cont ARDS protocol. - Maintain off paralytics. - Attempt to lighten sedation and decrease fluid intake for today. - Scheduled BD's >> changed to atrovent and xopenex in setting of tachycardia - F/u CXR. - Lasix drip as below. - Postural drainage, bed percussion    CARDIOVASCULAR L Impella 1/31>>>2/5 Lt [place] CVL 2/08 >> A:  Cardiogenic shock post STEMI on admission - echo 2/7 EF 25-30%.  STEMI r/t stent occlusion, now s/p DES to LAD and RCA Septic shock developed from MRSA PNA 2/10. SVT, A fib with RVR. Cvp=13 2/14 P: - Amiodarone, brilinta per cardiology - Hold aldactone for now - Continue ASA, lipitor, digoxin per cardiology - Titrate levophed for MAP  RENAL A:  AKI in setting of cardiogenic/septic shock. Hypokalemia. Hypernatremia  P:   - Monitor BMET and UOP. - Replace electrolytes as needed. - Continue free water to 250 ml q4 hrs. - Increase Lasix drip 10 mg/hr x24 hours. - Zaroxolyn 5 mg PO x1. - D5W 50 ml/hr for another 24 hours. - Will attempt to minimize all fluid intake today as able now that paralytics are off, decrease sedation, let BP rise then will decrease levophed.  GASTROINTESTINAL A:   Nutrition. Constipation. ?upper GI bleed 2/05 >> no further episodes since. P:   - Protonix BID. - Bowel regimen. - TF per nutrition.  HEMATOLOGIC A:  Thrombocytopenia >> likely from impella >> resolved. Anemia of critical illness. P:  - F/u CBC. - SQ heparin for DVT prevention.  INFECTIOUS A: MRSA HCAP. Febrile overnight. P:    - Vanc 2/8>>> - Cefepime 2/17>>>  - Blood 2/08 >>ng - Sputum 2/08 >> MRSA - Blood 2/17>>>  ENDOCRINE A:   Hyperglycemia. P:   - SSI with lantus  NEUROLOGIC A:  Acute encephalopathy 2nd to respiratory failure, cardiogenic shock. P:   - RASS goal 0 to -2 now that paralytics are off - Minimize sedation as able.  UPDATES:  Family  Updated on 2/16, wish for full support, will consider tracheostomy when safe.  The patient is critically ill with multiple organ systems failure and requires high complexity decision making for assessment and support, frequent evaluation and titration of therapies, application of advanced monitoring technologies and extensive interpretation of multiple  databases.   Critical Care Time devoted to patient care services described in this note is  35  Minutes. This time reflects time of care of this signee Dr Koren Bound. This critical care time does not reflect procedure time, or teaching time or supervisory time of PA/NP/Med student/Med Resident etc but could involve care discussion time.  Alyson Reedy, M.D. [facility] Pulmonary/Critical Care Medicine. Pager: [phone]. After hours pager: [phone].

## 2014-04-18 NOTE — Progress Notes (Signed)
RT at bed side per RN request due to huge cuff leak with 0 ml return on exhale volume. I deflated the cuff and advanced the ET Tube 2cm for a good seal and return of volume. Good BBS noted on auscultation. X ray pending for tube placement.

## 2014-04-18 NOTE — Progress Notes (Signed)
eLink Physician-Brief Progress Note Patient Name: Micheal Ayala DOB: 06/25/1955 MRN: [id number]   Date of Service  04/18/2014  HPI/Events of Note  Acute huge cuff leak with no return volumes. Pt was moved in bed and apparently ETT withdrawn slightly. Reinserted 2cm by RT with return of normal Vt  eICU Interventions  STAT CXR to confirm placement      Intervention Category Intermediate Interventions: Respiratory distress - evaluation and management  Nannette Zill S. 04/18/2014, 10:12 PM

## 2014-04-19 ENCOUNTER — Inpatient Hospital Stay (HOSPITAL_COMMUNITY): Payer: Medicare Other

## 2014-04-19 DIAGNOSIS — I480 Paroxysmal atrial fibrillation: Secondary | ICD-10-CM

## 2014-04-19 LAB — BASIC METABOLIC PANEL
ANION GAP: 8 (ref 5–15)
Anion gap: 9 (ref 5–15)
BUN: 95 mg/dL — AB (ref 6–23)
BUN: 103 mg/dL — AB (ref 6–23)
CALCIUM: 8 mg/dL — AB (ref 8.4–10.5)
CHLORIDE: 98 mmol/L (ref 96–112)
CO2: 36 mmol/L — ABNORMAL HIGH (ref 19–32)
CO2: 34 mmol/L — ABNORMAL HIGH (ref 19–32)
Calcium: 7.8 mg/dL — ABNORMAL LOW (ref 8.4–10.5)
Chloride: 101 mmol/L (ref 96–112)
Creatinine, Ser: 1.36 mg/dL — ABNORMAL HIGH (ref 0.50–1.35)
Creatinine, Ser: 1.41 mg/dL — ABNORMAL HIGH (ref 0.50–1.35)
GFR calc non Af Amer: 56 mL/min — ABNORMAL LOW (ref >90)
GFR, EST AFRICAN AMERICAN: 65 mL/min — AB (ref >90)
GFR, EST AFRICAN AMERICAN: 62 mL/min — AB (ref >90)
GFR, EST NON AFRICAN AMERICAN: 53 mL/min — AB (ref >90)
Glucose, Bld: 139 mg/dL — ABNORMAL HIGH (ref 70–99)
Glucose, Bld: 126 mg/dL — ABNORMAL HIGH (ref 70–99)
Potassium: 2.1 mmol/L — CL (ref 3.5–5.1)
Potassium: 2.7 mmol/L — CL (ref 3.5–5.1)
Sodium: 143 mmol/L (ref 135–145)
Sodium: 143 mmol/L (ref 135–145)

## 2014-04-19 LAB — GLUCOSE, CAPILLARY
GLUCOSE-CAPILLARY: 118 mg/dL — AB (ref 70–99)
Glucose-Capillary: 127 mg/dL — ABNORMAL HIGH (ref 70–99)
Glucose-Capillary: 131 mg/dL — ABNORMAL HIGH (ref 70–99)
Glucose-Capillary: 142 mg/dL — ABNORMAL HIGH (ref 70–99)
Glucose-Capillary: 145 mg/dL — ABNORMAL HIGH (ref 70–99)
Glucose-Capillary: 146 mg/dL — ABNORMAL HIGH (ref 70–99)
Glucose-Capillary: 95 mg/dL (ref 70–99)

## 2014-04-19 LAB — BLOOD GAS, ARTERIAL
Acid-Base Excess: 9.9 mmol/L — ABNORMAL HIGH (ref 0.0–2.0)
BICARBONATE: 33.6 meq/L — AB (ref 20.0–24.0)
Drawn by: 40415
FIO2: 0.7 %
O2 Saturation: 99 %
PCO2 ART: 42.9 mmHg (ref 35.0–45.0)
PEEP: 14 cmH2O
PH ART: 7.506 — AB (ref 7.350–7.450)
Patient temperature: 98.6
RATE: 24 resp/min
TCO2: 34.9 mmol/L (ref 0–100)
VT: 520 mL
pO2, Arterial: 122 mmHg — ABNORMAL HIGH (ref 80.0–100.0)

## 2014-04-19 LAB — CBC
HCT: 29.1 % — ABNORMAL LOW (ref 39.0–52.0)
Hemoglobin: 8.7 g/dL — ABNORMAL LOW (ref 13.0–17.0)
MCH: 28 pg (ref 26.0–34.0)
MCHC: 29.9 g/dL — ABNORMAL LOW (ref 30.0–36.0)
MCV: 93.6 fL (ref 78.0–100.0)
Platelets: 324 10*3/uL (ref 150–400)
RBC: 3.11 MIL/uL — AB (ref 4.22–5.81)
RDW: 18.2 % — ABNORMAL HIGH (ref 11.5–15.5)
WBC: 14.3 10*3/uL — AB (ref 4.0–10.5)

## 2014-04-19 LAB — PROTIME-INR
INR: 1.2 (ref 0.00–1.49)
PROTHROMBIN TIME: 15.4 s — AB (ref 11.6–15.2)

## 2014-04-19 LAB — PHOSPHORUS: PHOSPHORUS: 3.7 mg/dL (ref 2.3–4.6)

## 2014-04-19 LAB — MAGNESIUM: Magnesium: 2.2 mg/dL (ref 1.5–2.5)

## 2014-04-19 LAB — APTT: aPTT: 23 seconds — ABNORMAL LOW (ref 24–37)

## 2014-04-19 MED ORDER — POTASSIUM CHLORIDE 10 MEQ/50ML IV SOLN
10.0000 meq | INTRAVENOUS | Status: AC
  Administered 2014-04-19 (×6): 10 meq via INTRAVENOUS
  Filled 2014-04-19 (×4): qty 50

## 2014-04-19 MED ORDER — ACETAZOLAMIDE SODIUM 500 MG IJ SOLR
250.0000 mg | Freq: Three times a day (TID) | INTRAMUSCULAR | Status: AC
  Administered 2014-04-19 (×2): 250 mg via INTRAVENOUS
  Filled 2014-04-19 (×2): qty 500

## 2014-04-19 MED ORDER — PRO-STAT SUGAR FREE PO LIQD
60.0000 mL | Freq: Three times a day (TID) | ORAL | Status: DC
  Administered 2014-04-19 – 2014-05-02 (×39): 60 mL
  Filled 2014-04-19 (×42): qty 60

## 2014-04-19 MED ORDER — MIDAZOLAM HCL 2 MG/2ML IJ SOLN: 4.0000 mg | Freq: Once | INTRAMUSCULAR | Status: DC

## 2014-04-19 MED ORDER — ETOMIDATE 2 MG/ML IV SOLN
40.0000 mg | Freq: Once | INTRAVENOUS | Status: AC
  Administered 2014-04-20: 40 mg via INTRAVENOUS
  Filled 2014-04-19: qty 20

## 2014-04-19 MED ORDER — FUROSEMIDE 10 MG/ML IJ SOLN
8.0000 mg/h | INTRAVENOUS | Status: DC
  Administered 2014-04-19: 8 mg/h via INTRAVENOUS
  Filled 2014-04-19: qty 25

## 2014-04-19 MED ORDER — SPIRONOLACTONE 50 MG PO TABS
50.0000 mg | ORAL_TABLET | Freq: Two times a day (BID) | ORAL | Status: AC
  Administered 2014-04-19 (×2): 50 mg via ORAL
  Filled 2014-04-19 (×2): qty 1

## 2014-04-19 MED ORDER — VITAL HIGH PROTEIN PO LIQD
1000.0000 mL | ORAL | Status: DC
  Administered 2014-04-19: 1000 mL
  Administered 2014-04-20
  Administered 2014-04-20 – 2014-04-21 (×2): 1000 mL
  Administered 2014-04-21 – 2014-04-22 (×6)
  Administered 2014-04-22: 1000 mL
  Administered 2014-04-22: 17:00:00
  Administered 2014-04-23 – 2014-04-26 (×4): 1000 mL
  Administered 2014-04-27 (×2)
  Administered 2014-04-27 – 2014-04-30 (×5): 1000 mL
  Administered 2014-04-30: 16:00:00
  Administered 2014-04-30: 1000 mL
  Administered 2014-04-30 (×2)
  Administered 2014-05-02: 1000 mL
  Filled 2014-04-19 (×17): qty 1000

## 2014-04-19 MED ORDER — METOLAZONE 5 MG PO TABS
5.0000 mg | ORAL_TABLET | Freq: Every day | ORAL | Status: AC
  Administered 2014-04-19: 5 mg via ORAL
  Filled 2014-04-19: qty 1

## 2014-04-19 MED ORDER — PROPOFOL 10 MG/ML IV EMUL: 5.0000 ug/kg/min | Freq: Once | INTRAVENOUS | Status: DC

## 2014-04-19 MED ORDER — POTASSIUM CHLORIDE 10 MEQ/100ML IV SOLN: 10.0000 meq | INTRAVENOUS | Status: DC

## 2014-04-19 MED ORDER — POTASSIUM CHLORIDE 20 MEQ/15ML (10%) PO SOLN
40.0000 meq | Freq: Once | ORAL | Status: AC
  Administered 2014-04-19: 40 meq
  Filled 2014-04-19 (×2): qty 30

## 2014-04-19 MED ORDER — FENTANYL CITRATE 0.05 MG/ML IJ SOLN: 200.0000 ug | Freq: Once | INTRAMUSCULAR | Status: DC

## 2014-04-19 MED ORDER — FENTANYL CITRATE 0.05 MG/ML IJ SOLN
200.0000 ug | Freq: Once | INTRAMUSCULAR | Status: AC
  Administered 2014-04-20: 200 ug via INTRAVENOUS

## 2014-04-19 MED ORDER — POTASSIUM CHLORIDE 20 MEQ/15ML (10%) PO SOLN
40.0000 meq | Freq: Three times a day (TID) | ORAL | Status: AC
  Administered 2014-04-19 (×3): 40 meq
  Filled 2014-04-19 (×2): qty 30

## 2014-04-19 MED ORDER — MIDAZOLAM HCL 2 MG/2ML IJ SOLN
4.0000 mg | Freq: Once | INTRAMUSCULAR | Status: AC
  Administered 2014-04-20: 4 mg via INTRAVENOUS

## 2014-04-19 MED ORDER — VECURONIUM BROMIDE 10 MG IV SOLR
10.0000 mg | Freq: Once | INTRAVENOUS | Status: DC
  Filled 2014-04-19: qty 10

## 2014-04-19 MED ORDER — POTASSIUM CHLORIDE 10 MEQ/50ML IV SOLN
10.0000 meq | INTRAVENOUS | Status: AC
  Administered 2014-04-19 – 2014-04-20 (×4): 10 meq via INTRAVENOUS
  Filled 2014-04-19 (×3): qty 50

## 2014-04-19 NOTE — Progress Notes (Addendum)
Patient ID: Micheal Ayala, male   DOB: 18-Jan-1956, 59 y.o.   MRN: [id number]  Advanced Heart Failure Rounding Note   Subjective:    Impella removed 2/5.  Off norepinephrine 2/5.   Echo (2/7) with EF 25-30%, peri-apical akinesis, no LV thrombus noted, normal RV.   Failed paralytic wean on 2/13.   Remains intubated in setting of severe MRSA PNA and ARDS.  This am FiO2 cut back to 60%.  Tolerating well. Diuresing well. Hypernatremia improving.   Remains on Norepi 8. SBP ~100. On lasix gtt per ccm. Renal function stable. CVP checked personally 17.   In/out of AF. Remains on IV amio  CXR reviewed - diffuse infiltrates and effusions. Unchanged.    Objective:   Weight Range:  Vital Signs:   Temp:  [97.6 F (36.4 C)-98.9 F (37.2 C)] 97.7 F (36.5 C) (02/18 0800) Pulse Rate:  [46-119] 83 (02/18 1000) Resp:  [19-29] 28 (02/18 1000) BP: (82-111)/(47-70) 111/55 mmHg (02/18 0726) SpO2:  [80 %-100 %] 80 % (02/18 1000) Arterial Line BP: (72-132)/(48-75) 92/48 mmHg (02/18 1000) FiO2 (%):  [50 %-100 %] 60 % (02/18 1013) Weight:  [105 kg (231 lb 7.7 oz)] 105 kg (231 lb 7.7 oz) (02/18 0500) Last BM Date: 04/18/14  Weight change: Filed Weights   04/17/14 0428 04/18/14 0500 04/19/14 0500  Weight: 108.2 kg (238 lb 8.6 oz) 104.9 kg (231 lb 4.2 oz) 105 kg (231 lb 7.7 oz)    Intake/Output:   Intake/Output Summary (Last 24 hours) at 04/19/14 1033 Last data filed at 04/19/14 1014  Gross per 24 hour  Intake 5785.36 ml  Output   5925 ml  Net -139.64 ml     Physical Exam: General:  Intubated.Sedated HEENT: normal except for R eye enucleation  OGT and ETT Neck: supple.Carotids 2+ bilat; no bruits.   Cor: PMI laterally displaced. Regular tachy Lungs: rhonchi Abdomen: soft, nontender, + distended. No hepatosplenomegaly. No bruits or masses. Hypoactive bowel sounds. Extremities: no cyanosis, clubbing, rash, 1+ edema. Neuro: awake on vent. agitated  Telemetry: Sinus 80s. intermittent  AF  Labs: Basic Metabolic Panel:  Recent Labs Lab 04/13/14 0400  04/14/14 1104 04/15/14 0410 04/16/14 0400 04/17/14 0420 04/18/14 0335 04/19/14 0411  NA 149*  < >  --  150* 151* 150* 147* 143  K 3.6  < >  --  3.6 3.3* 2.8* 2.4* 2.1*  CL 110  < >  --  113* 110 110 105 98  CO2 29  < >  --  36* 36*  GLUCOSE 129*  < >  --  125* 183* 151* 134* 139*  BUN 75*  < >  --  72* 74* 76* 84* 95*  CREATININE 1.25  < >  --  1.22 1.18 1.31 1.25 1.36*  CALCIUM 7.7*  < >  --  7.6* 7.2* 7.3* 7.7* 7.8*  MG 3.1*  --   --   --   --  2.9* 2.3 2.2  PHOS  --   --  3.4  --   --  3.5 3.7 3.7  < > = values in this interval not displayed.  Liver Function Tests:  Recent Labs Lab 04/13/14 0400  AST 91*  ALT 63*  ALKPHOS 319*  BILITOT 1.4*  PROT 6.0  ALBUMIN 1.5*   No results for input(s): LIPASE, AMYLASE in the last 168 hours. No results for input(s): AMMONIA in the last 168 hours.  CBC:  Recent Labs Lab 04/15/14 0410 04/16/14 0400 04/17/14  [id number] 04/18/14 0335 04/19/14 0411  WBC 14.1* 14.7* 12.4* 14.3* 14.3*  HGB 8.9* 9.0* 8.6* 9.3* 8.7*  HCT 30.2* 31.0* 29.6* 31.8* 29.1*  MCV 98.1 97.5 97.4 95.5 93.6  PLT 380 422* 397 356 324    Cardiac Enzymes: No results for input(s): CKTOTAL, CKMB, CKMBINDEX, TROPONINI in the last 168 hours.  BNP: BNP (last 3 results) No results for input(s): PROBNP in the last 8760 hours.   Other results:    Imaging: Dg Chest Port 1 View  04/19/2014   CLINICAL DATA:  Endotracheal tube.  EXAM: PORTABLE CHEST - 1 VIEW  COMPARISON:  04/18/2014  FINDINGS: The endotracheal tube tip is at the level of the clavicular heads. There is a nasogastric tube extending below the diaphragm and off the inferior edge of the image. There is a left subclavian central line with tip in the SVC. Diffuse airspace opacities persist bilaterally without significant interval change.  IMPRESSION: No significant interval change, with persistent diffuse airspace opacities.    Electronically Signed   By: Ellery Plunkaniel R Mitchell M.D.   On: 04/19/2014 05:35   Dg Chest Port 1 View  04/18/2014   CLINICAL DATA:  Intubated patient.  EXAM: PORTABLE CHEST - 1 VIEW  COMPARISON:  Single view of the CS 04/18/2014 and 5:09 a.m.  FINDINGS: ET tube is in place with tip in good position, well above the carina. Left subclavian catheter and NG tube are also again seen, unchanged. Diffuse bilateral airspace disease persists. Heart size is upper normal. No pneumothorax identified.  IMPRESSION: Support tubes and lines projecting good position. No change in extensive bilateral airspace disease.   Electronically Signed   By: Drusilla Kannerhomas  Dalessio M.D.   On: 04/18/2014 22:30   Dg Chest Port 1 View  04/18/2014   CLINICAL DATA:  Intubation.  EXAM: PORTABLE CHEST - 1 VIEW  COMPARISON:  04/17/2014.  FINDINGS: Endotracheal tube, left subclavian central line, NG tube in stable position. Persistent cardiomegaly with bilateral pulmonary alveolar infiltrates again noted consistent congestive heart failure pulmonary edema. Slight improvement of pulmonary edema noted. Pneumonia cannot be excluded. Small right pleural effusion. No pneumothorax.  IMPRESSION: 1. Lines and tubes in stable position. 2. Persistent changes of congestive heart failure and pulmonary edema with slight improvement of pulmonary edema. Small right pleural effusion.   Electronically Signed   By: Maisie Fushomas  Register   On: 04/18/2014 07:03     Medications:     Scheduled Medications: . acetaZOLAMIDE  250 mg Intravenous Q8H  . antiseptic oral rinse  7 mL Mouth Rinse QID  . aspirin  81 mg Per Tube Daily  . atorvastatin  80 mg Per Tube q1800  . ceFEPime (MAXIPIME) IV  1 g Intravenous Q8H  . chlorhexidine  15 mL Mouth Rinse BID  . digoxin  0.25 mg Intravenous Daily  . docusate  100 mg Oral BID  . feeding supplement (PRO-STAT SUGAR FREE 64)  60 mL Per Tube TID  . feeding supplement (VITAL HIGH PROTEIN)  1,000 mL Per Tube Q24H  . free water  250 mL  Per Tube Q4H  . heparin subcutaneous  5,000 Units Subcutaneous Q8H  . insulin aspart  0-15 Units Subcutaneous 6 times per day  . insulin glargine  35 Units Subcutaneous Daily  . ipratropium  0.5 mg Nebulization Q6H  . levalbuterol  0.63 mg Nebulization Q6H  . metolazone  5 mg Oral Daily  . multivitamin  5 mL Per Tube Daily  . pantoprazole sodium  40 mg Per Tube  Q24H  . potassium chloride  10 mEq Intravenous Q1 Hr x 6  . potassium chloride  40 mEq Per Tube TID  . QUEtiapine  100 mg Oral BID  . spironolactone  50 mg Oral BID  . ticagrelor  90 mg Per Tube BID  . vancomycin  1,000 mg Intravenous Q12H    Infusions: . amiodarone 30 mg/hr (04/19/14 0700)  . cisatracurium (NIMBEX) infusion Stopped (04/17/14 1115)  . dextrose Stopped (04/19/14 1000)  . fentaNYL infusion INTRAVENOUS 250 mcg/hr (04/19/14 0830)  . furosemide (LASIX) infusion 8 mg/hr (04/19/14 1007)  . midazolam (VERSED) infusion 2 mg/hr (04/19/14 0900)  . norepinephrine (LEVOPHED) Adult infusion 8 mcg/min (04/19/14 0700)    PRN Medications: Place/Maintain arterial line **AND** sodium chloride, acetaminophen (TYLENOL) oral liquid 160 mg/5 mL, bisacodyl, fentaNYL, Influenza vac split quadrivalent PF, levalbuterol, midazolam, ondansetron (ZOFRAN) IV, pneumococcal 23 valent vaccine, sennosides, sorbitol   Assessment:   1. Cardiogenic shock    --s/p Impella placement 1/31, Impella out 2/5.  2. Acute on chronic systolic HF    --EF 10% on echo    --Repeat echo (2/7) with EF 25-30%, peri-apical akinesis, no LV thrombus, normal RV 3. Anterolateral STEMI 1/31   --due to stent occlusion   --s/p PCI with DES to LAD and OM-2 on 1/31   --s/p PCI with DES to RCA 2/5 4. VDRF 5. CAD 6. Tobacco abuse ongoing 7. 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 Hypomagnesemia/hyponatremia 9. Anemia s/p 3u RBCs 10. SVT: Amiodarone started 11. MRSA Pneumonia, hospital acquired 12. Acute kidney injury 13.  Hypernatremia  Plan/Discussion:    Main issue now is severe MRSA PNA and ARDS. FiO2 slightly improved today. Continue supportive care. Probable tach tomorrow per CCM.  HF currently stable. Continue norepi for BP support. Continue lasix gtt.  Continue ASA, statin, brilinta.  Continue IV amio for AF.   K being supped.   Family updated.   The patient is critically ill with multiple organ systems failure and requires high complexity decision making for assessment and support, frequent evaluation and titration of therapies, application of advanced monitoring technologies and extensive interpretation of multiple databases.   Critical Care Time devoted to patient care services described in this note is 60 Minutes.   Length of Stay: 67 Arvilla Meres MD 04/19/2014, 10:33 AM  Advanced Heart Failure Team Pager [phone] (M-F; 7a - 4p)  Please contact [facility] for night-coverage after hours (4p -7a ) and weekends on [email or web address]

## 2014-04-19 NOTE — Progress Notes (Signed)
NUTRITION FOLLOW-UP  INTERVENTION:  Needs bowel regimen, discussed with RN  Decrease Vital High Protein to 59m/hr  Increase ProStat 60 ml TID  Provides 1440 kcals (67% of needs), 163 grams protein, 702 ml H20 Total free water: 2503 ml   NUTRITION DIAGNOSIS: Inadequate oral intake related to unable to eat as evidenced by NPO status; ongoing.   Goal: Enteral nutrition to provide 60-70% of estimated calorie needs (22-25 kcals/kg ideal body weight) and 100% of estimated protein needs, based on ASPEN guidelines for hypocaloric, high protein feeding in critically ill obese individuals; met.   Monitor:  TF tolerance, PO status, weight, labs    ASSESSMENT: 59[place], was presented to ED via EMS with severe chest pain, hypertension and shortness of breath. He underwent emergency left and right heart catheterization and placement of a Impella CP ventricular assist device via femoral artery, removed 2/5. Occluded prior stents have been replaced with new stents.   Patient is currently intubated on ventilator support MV: 11.9 L/min Temp (24hrs), Avg:98.1 F (36.7 C), Min:97.6 F (36.4 C), Max:98.9 F (37.2 C) Temperature trended down, estimated needs adjusted.  Potassium low on replacement.   Per MD respiratory status worsening due to MRSA PNA and ARDS. Plan for trach on Friday. MD's discussing plan of care with family.   Pt is on colace BID and has dulcolax ordered daily prn. Pt with diarrhea 2/16.   Pt receiving Vital High Protein @ 50 ml/hr with 60 ml prostat BID via OG tube.  Tube feeding regimen provides 1600 kcal (66% of needs), 165 grams of protein, and 1003 ml of H2O.  250 ml H2O every 4 hours Total free water: 2503 ml  Height: Ht Readings from Last 1 Encounters:  04/11/14 _0  (1.803 m)    Weight: Wt Readings from Last 1 Encounters:  04/19/14 231 lb 7.7 oz (105 kg)  Admission weight: 218 lb (99.1 kg)  1/31 Pt positive 10 L since admission   BMI:  Body mass index is 32.3 kg/(m^2). obese  Estimated Nutritional Needs: Kcal: 2132 Protein: 155-175 grams Fluid: 1.8 L daily  Skin: intact  Diet Order:     Intake/Output Summary (Last 24 hours) at 04/19/14 0933 Last data filed at 04/19/14 0908  Gross per 24 hour  Intake 5628.48 ml  Output   6000 ml  Net -371.52 ml    Last BM: 2/16  Labs:   Recent Labs Lab 04/17/14 0420 04/18/14 0335 04/19/14 0411  NA 150* 147* 143  K 2.8* 2.4* 2.1*  CL 110 105 98  CO2 28 36* 36*  BUN 76* 84* 95*  CREATININE 1.31 1.25 1.36*  CALCIUM 7.3* 7.7* 7.8*  MG 2.9* 2.3 2.2  PHOS 3.5 3.7 3.7  GLUCOSE 151* 134* 139*    CBG (last 3)   Recent Labs  04/19/14 0011 04/19/14 0429 04/19/14 0811  GLUCAP 145* 131* 146*    Scheduled Meds: . antiseptic oral rinse  7 mL Mouth Rinse QID  . aspirin  81 mg Per Tube Daily  . atorvastatin  80 mg Per Tube q1800  . ceFEPime (MAXIPIME) IV  1 g Intravenous Q8H  . chlorhexidine  15 mL Mouth Rinse BID  . digoxin  0.25 mg Intravenous Daily  . docusate  100 mg Oral BID  . feeding supplement (PRO-STAT SUGAR FREE 64)  60 mL Per Tube BID  . feeding supplement (VITAL HIGH PROTEIN)  1,000 mL Per Tube Q24H  .  free water  250 mL Per Tube Q4H  . heparin subcutaneous  5,000 Units Subcutaneous Q8H  . insulin aspart  0-15 Units Subcutaneous 6 times per day  . insulin glargine  35 Units Subcutaneous Daily  . ipratropium  0.5 mg Nebulization Q6H  . levalbuterol  0.63 mg Nebulization Q6H  . multivitamin  5 mL Per Tube Daily  . pantoprazole sodium  40 mg Per Tube Q24H  . potassium chloride  10 mEq Intravenous Q1 Hr x 6  . potassium chloride  40 mEq Per Tube TID  . QUEtiapine  100 mg Oral BID  . ticagrelor  90 mg Per Tube BID  . vancomycin  1,000 mg Intravenous Q12H    Continuous Infusions: . amiodarone 30 mg/hr (04/19/14 0700)  . cisatracurium (NIMBEX) infusion Stopped (04/17/14 1115)  . dextrose 50 mL/hr at  04/19/14 0700  . fentaNYL infusion INTRAVENOUS 125 mcg/hr (04/19/14 0800)  . midazolam (VERSED) infusion 1 mg/hr (04/19/14 0800)  . norepinephrine (LEVOPHED) Adult infusion 8 mcg/min (04/19/14 0700)   Maylon Peppers RD, [place], CNSC [phone] Pager [phone] After Hours Pager

## 2014-04-19 NOTE — Progress Notes (Signed)
eLink Physician-Brief Progress Note Patient Name: Micheal Ayala DOB: 07/31/1955 MRN: [id number]   Date of Service  04/19/2014  HPI/Events of Note  Critically low potassium again Cr rising  eICU Interventions  Aggressively replete K again Hold lasix gtt> consider potassium sparing diuretic strategy if bedside team feels he is still volume up     Intervention Category Intermediate Interventions: Electrolyte abnormality - evaluation and management  Micheal Ayala 04/19/2014, 4:54 AM

## 2014-04-19 NOTE — Progress Notes (Signed)
Pt spo2 81-83% on fio2 50%, peep 10.  Increased to 60% and peep of 12 per Dr Molli KnockYacoub. Verbal Order

## 2014-04-19 NOTE — Progress Notes (Signed)
PULMONARY / CRITICAL CARE MEDICINE   Name: Micheal Ayala MRN: [id number] DOB: 06/27/1955    ADMISSION DATE:  03/25/2014 CONSULTATION DATE:  03/12/2014  REFERRING MD :  Dr. Herbie BaltimoreHarding  CHIEF COMPLAINT:  STEMI  INITIAL PRESENTATION:  59 yo smoker with STEMI, cardiogenic shock, VDRF.  [place].  STUDIES:  1/31 Cardiac cath >> severe CAD with occlusion of LAD, circumflex, RCS, ischemic CM >> DES to LAD, circumflex 1/31 Echo >> LVEF 20-25%, severely reduced systolic function 2/04 LHC > DES RCA 2/06 Echo >> EF 25 to 30%, mod LVH, grade 2 diastolic dysfx  SIGNIFICANT EVENTS: 1/31 admit, cardiac cath, impella 2/01 TCTS consulted, transfuse 1 unit PRBC 2/05 impella removed; ?upper GI bleeding  2/08 increased respiratory secretions 2/10 ARDS protocol, added levophed 2/11 start paralytic 2/12 - tried off nimbex, liberated to 7cc/kg   SUBJECTIVE:  Did not tolerate nimbex d/c yesterday.  Now back on paralytic, worsening oxygenation.  On 100%, peep 14.  Increased secretions.   VITAL SIGNS: Temp:  [97.6 F (36.4 C)-98.9 F (37.2 C)] 97.7 F (36.5 C) (02/18 0800) Pulse Rate:  [46-119] 72 (02/18 0900) Resp:  [19-29] 24 (02/18 0900) BP: (82-111)/(47-70) 111/55 mmHg (02/18 0726) SpO2:  [83 %-100 %] 95 % (02/18 0900) Arterial Line BP: (72-139)/(48-75) 103/52 mmHg (02/18 0900) FiO2 (%):  [50 %-100 %] 50 % (02/18 0726) Weight:  [105 kg (231 lb 7.7 oz)] 105 kg (231 lb 7.7 oz) (02/18 0500) HEMODYNAMICS: CVP:  [14 mmHg-17 mmHg] 14 mmHg VENTILATOR SETTINGS: Vent Mode:  [-] PRVC FiO2 (%):  [50 %-100 %] 50 % Set Rate:  [24 bmp] 24 bmp Vt Set:  [520 mL] 520 mL PEEP:  [14 cmH20] 14 cmH20 Plateau Pressure:  [32 cmH20] 32 cmH20 INTAKE / OUTPUT:  Intake/Output Summary (Last 24 hours) at 04/19/14 0935 Last data filed at 04/19/14 0908  Gross per 24 hour  Intake 5628.48 ml  Output   6000 ml  Net -371.52 ml   PHYSICAL EXAMINATION:  Gen: withdraws to pain, critically ill  appearing  HEENT: Rt eye enucleated, ETT in place, copious ETT secretions  PULM: resps even on vent, coarse, diminished bases  CV: irregular AB: decreased bowel sounds, soft Ext: 1+ BLE edema  Neuro: RASS -3, no longer paralyzed  LABS:  CBC  Recent Labs Lab 04/17/14 0420 04/18/14 0335 04/19/14 0411  WBC 12.4* 14.3* 14.3*  HGB 8.6* 9.3* 8.7*  HCT 29.6* 31.8* 29.1*  PLT 397 356 324   Coag's No results for input(s): APTT, INR in the last 168 hours. BMET  Recent Labs Lab 04/17/14 0420 04/18/14 0335 04/19/14 0411  NA 150* 147* 143  K 2.8* 2.4* 2.1*  CL 110 105 98  CO2 28 36* 36*  BUN 76* 84* 95*  CREATININE 1.31 1.25 1.36*  GLUCOSE 151* 134* 139*   Electrolytes  Recent Labs Lab 04/17/14 0420 04/18/14 0335 04/19/14 0411  CALCIUM 7.3* 7.7* 7.8*  MG 2.9* 2.3 2.2  PHOS 3.5 3.7 3.7   ABG  Recent Labs Lab 04/17/14 1151 04/18/14 0333 04/19/14 0433  PHART 7.366 7.452* 7.506*  PCO2ART 49.4* 48.3* 42.9  PO2ART 46.0* 96.8 122.0*   Glucose  Recent Labs Lab 04/18/14 1234 04/18/14 1617 04/18/14 2012 04/19/14 0011 04/19/14 0429 04/19/14 0811  GLUCAP 168* 123* 138* 145* 131* 146*   ASSESSMENT / PLAN:  PULMONARY ETT 1/31>>> A:  Acute respiratory failure due to cardiogenic shock and pulmonary edema.   MRSA HCAP 2/08 with severe ARDS. Tobacco abuse.  P:   - Full vent support >> will need a trach, FiO2 down to 50 and PEEP to 10, if tolerated through the day today will proceed with tracheostomy in AM. - Cont ARDS protocol. - Maintain off paralytics. - Attempt to lighten sedation. - Scheduled BD's >> changed to atrovent and xopenex in setting of tachycardia - Decrease Lasix drip to 8 mg/hr. - Postural drainage, bed percussion   CARDIOVASCULAR L Impella 1/31>>>2/5 Lt [place] CVL 2/08 >> A:  Cardiogenic shock post STEMI on admission - echo 2/7 EF 25-30%.  STEMI r/t stent occlusion, now s/p DES to LAD and RCA Septic shock developed from MRSA PNA 2/10. SVT,  A fib with RVR. Cvp=13 2/14 P: - Amiodarone, brilinta per cardiology - Add aldactone as a potassium sparing agent - Continue ASA, lipitor, digoxin per cardiology - Titrate levophed for MAP  RENAL A:  AKI in setting of cardiogenic/septic shock. Hypokalemia. Hypernatremia  P:   - Monitor BMET and UOP. - Replace electrolytes as indicated. - Continue free water to 250 ml q4 hrs. - Decrease Lasix drip 8 mg/hr x24 hours. - Aldactone added 50 BID x2 doses. - Zaroxolyn 5 mg PO x1. - D/C D5W. - Albumin 25% 50 ml q6 x2 doses.  GASTROINTESTINAL A:   Nutrition. Constipation. ?upper GI bleed 2/05 >> no further episodes since. P:   - Protonix BID. - Bowel regimen. - TF per nutrition.  HEMATOLOGIC A:  Thrombocytopenia >> likely from impella >> resolved. Anemia of critical illness. P:  - F/u CBC. - SQ heparin for DVT prevention.  INFECTIOUS A: MRSA HCAP. Febrile overnight. P:    - Vanc 2/8>>> - Cefepime 2/17>>>  - Blood 2/08 >>ng - Sputum 2/08 >> MRSA - Blood 2/17>>>  ENDOCRINE A:   Hyperglycemia. P:   - SSI with lantus  NEUROLOGIC A:  Acute encephalopathy 2nd to respiratory failure, cardiogenic shock. P:   - RASS goal 0 to -2 now that paralytics are off - Minimize sedation as able.  UPDATES:  Family  Updated on 2/18, consent obtained for tracheostomy on 2/19, will schedule, assuming FiO2 and PEEP continue to improve.  The patient is critically ill with multiple organ systems failure and requires high complexity decision making for assessment and support, frequent evaluation and titration of therapies, application of advanced monitoring technologies and extensive interpretation of multiple databases.   Critical Care Time devoted to patient care services described in this note is  35  Minutes. This time reflects time of care of this signee Dr Koren Bound. This critical care time does not reflect procedure time, or teaching time or supervisory time of PA/NP/Med  student/Med Resident etc but could involve care discussion time.  Alyson Reedy, M.D. [facility] Pulmonary/Critical Care Medicine. Pager: [phone]. After hours pager: [phone].

## 2014-04-19 NOTE — Progress Notes (Signed)
Per Dr Molli KnockYacoub, decreased Peep to 10 and fio2 to 40%. Ok for spo2 85-88%, If spo2 <85 and maintaining, increase peep back to 12. Verbal Order

## 2014-04-19 NOTE — Progress Notes (Signed)
eLink Physician Progress Note and Electrolyte Replacement  Patient Name: Micheal Ayala DOB: 10/16/1955 MRN: [id number]  Date of Service  04/19/2014   HPI/Events of Note    Recent Labs Lab 04/13/14 0400  04/14/14 1104  04/16/14 0400 04/17/14 0420 04/18/14 0335 04/19/14 0411 04/19/14 1933  NA 149*  < >  --   < > 151* 150* 147* 143 143  K 3.6  < >  --   < > 3.3* 2.8* 2.4* 2.1* 2.7*  CL 110  < >  --   < > 110 110 105 98 101  CO2 29  < >  --   < > 29 28 36* 36* 34*  GLUCOSE 129*  < >  --   < > 183* 151* 134* 139* 126*  BUN 75*  < >  --   < > 74* 76* 84* 95* 103*  CREATININE 1.25  < >  --   < > 1.18 1.31 1.25 1.36* 1.41*  CALCIUM 7.7*  < >  --   < > 7.2* 7.3* 7.7* 7.8* 8.0*  MG 3.1*  --   --   --   --  2.9* 2.3 2.2  --   PHOS  --   --  3.4  --   --  3.5 3.7 3.7  --   < > = values in this interval not displayed.  Estimated Creatinine Clearance: 70.4 mL/min (by C-G formula based on Cr of 1.41).  Intake/Output      02/18 0701 - 02/19 0700   I.V. (mL/kg) 877.1 (8.4)   NG/GT 560   IV Piggyback 500   Total Intake(mL/kg) 1937.1 (18.4)   Urine (mL/kg/hr) 2625 (1.8)   Total Output 2625   Net -688        - I/O DETAILED x 24h      - I/O THIS SHIFT    ASSESSMENT Low k due to lasix Normal mag. Phos Making urine Creat ok  eICURN Interventions  kcl 40 po + 40 IV   ASSESSMENT: MAJOR ELECTROLYTE      Dr. Kalman ShanMurali Micheal Ayala, M.D., [facility].P Pulmonary and Critical Care Medicine Staff Physician [facility] Pulmonary and Critical Care Pager: [phone], If no answer or between  15:00h - 7:00h: call [phone]  04/19/2014 8:39 PM

## 2014-04-20 ENCOUNTER — Inpatient Hospital Stay (HOSPITAL_COMMUNITY): Payer: Medicare Other

## 2014-04-20 LAB — BLOOD GAS, ARTERIAL
Acid-Base Excess: 7.4 mmol/L — ABNORMAL HIGH (ref 0.0–2.0)
Bicarbonate: 31.2 mEq/L — ABNORMAL HIGH (ref 20.0–24.0)
DRAWN BY: 252031
FIO2: 0.6 %
LHR: 24 {breaths}/min
MECHVT: 530 mL
O2 SAT: 96.1 %
PATIENT TEMPERATURE: 98.6
PEEP/CPAP: 12 cmH2O
TCO2: 32.5 mmol/L (ref 0–100)
pCO2 arterial: 42.5 mmHg (ref 35.0–45.0)
pH, Arterial: 7.479 — ABNORMAL HIGH (ref 7.350–7.450)
pO2, Arterial: 82.8 mmHg (ref 80.0–100.0)

## 2014-04-20 LAB — BASIC METABOLIC PANEL
Anion gap: 9 (ref 5–15)
Anion gap: 14 (ref 5–15)
BUN: 107 mg/dL — AB (ref 6–23)
BUN: 104 mg/dL — ABNORMAL HIGH (ref 6–23)
CALCIUM: 8.1 mg/dL — AB (ref 8.4–10.5)
CHLORIDE: 102 mmol/L (ref 96–112)
CO2: 33 mmol/L — AB (ref 19–32)
CO2: 32 mmol/L (ref 19–32)
CREATININE: 1.47 mg/dL — AB (ref 0.50–1.35)
Calcium: 8 mg/dL — ABNORMAL LOW (ref 8.4–10.5)
Chloride: 98 mmol/L (ref 96–112)
Creatinine, Ser: 1.55 mg/dL — ABNORMAL HIGH (ref 0.50–1.35)
GFR calc Af Amer: 59 mL/min — ABNORMAL LOW (ref >90)
GFR calc Af Amer: 55 mL/min — ABNORMAL LOW (ref >90)
GFR calc non Af Amer: 51 mL/min — ABNORMAL LOW (ref >90)
GFR, EST NON AFRICAN AMERICAN: 48 mL/min — AB (ref >90)
Glucose, Bld: 118 mg/dL — ABNORMAL HIGH (ref 70–99)
Glucose, Bld: 135 mg/dL — ABNORMAL HIGH (ref 70–99)
Potassium: 3.6 mmol/L (ref 3.5–5.1)
Potassium: 2.9 mmol/L — ABNORMAL LOW (ref 3.5–5.1)
SODIUM: 144 mmol/L (ref 135–145)
Sodium: 144 mmol/L (ref 135–145)

## 2014-04-20 LAB — GLUCOSE, CAPILLARY
GLUCOSE-CAPILLARY: 107 mg/dL — AB (ref 70–99)
GLUCOSE-CAPILLARY: 141 mg/dL — AB (ref 70–99)
Glucose-Capillary: 96 mg/dL (ref 70–99)

## 2014-04-20 LAB — CBC
HCT: 30.4 % — ABNORMAL LOW (ref 39.0–52.0)
HEMOGLOBIN: 9.1 g/dL — AB (ref 13.0–17.0)
MCH: 28.1 pg (ref 26.0–34.0)
MCHC: 29.9 g/dL — ABNORMAL LOW (ref 30.0–36.0)
MCV: 93.8 fL (ref 78.0–100.0)
Platelets: 317 10*3/uL (ref 150–400)
RBC: 3.24 MIL/uL — AB (ref 4.22–5.81)
RDW: 18.6 % — ABNORMAL HIGH (ref 11.5–15.5)
WBC: 14 10*3/uL — AB (ref 4.0–10.5)

## 2014-04-20 LAB — PHOSPHORUS: Phosphorus: 4 mg/dL (ref 2.3–4.6)

## 2014-04-20 LAB — MAGNESIUM: Magnesium: 2.4 mg/dL (ref 1.5–2.5)

## 2014-04-20 MED ORDER — FUROSEMIDE 10 MG/ML IJ SOLN
40.0000 mg | Freq: Two times a day (BID) | INTRAMUSCULAR | Status: DC
  Administered 2014-04-21: 40 mg via INTRAVENOUS
  Filled 2014-04-20 (×2): qty 4

## 2014-04-20 MED ORDER — ROCURONIUM BROMIDE 50 MG/5ML IV SOLN
50.0000 mg | Freq: Once | INTRAVENOUS | Status: AC
  Administered 2014-04-20: 50 mg via INTRAVENOUS
  Filled 2014-04-20: qty 5

## 2014-04-20 MED ORDER — FUROSEMIDE 10 MG/ML IJ SOLN
INTRAMUSCULAR | Status: AC
  Administered 2014-04-20: 40 mg
  Filled 2014-04-20: qty 4

## 2014-04-20 MED ORDER — POTASSIUM CHLORIDE 20 MEQ/15ML (10%) PO SOLN
40.0000 meq | Freq: Once | ORAL | Status: AC
  Administered 2014-04-20: 40 meq
  Filled 2014-04-20 (×2): qty 30

## 2014-04-20 MED ORDER — FUROSEMIDE 10 MG/ML IJ SOLN
5.0000 mg/h | INTRAVENOUS | Status: DC
  Filled 2014-04-20: qty 25

## 2014-04-20 MED ORDER — POTASSIUM CHLORIDE 10 MEQ/50ML IV SOLN
10.0000 meq | INTRAVENOUS | Status: AC
Start: 2014-04-20 — End: 2014-04-21
  Administered 2014-04-20 – 2014-04-21 (×4): 10 meq via INTRAVENOUS
  Filled 2014-04-20 (×3): qty 50

## 2014-04-20 NOTE — Procedures (Signed)
Bedside Tracheostomy Insertion Procedure Note   Patient Details:   Name: Micheal Ayala DOB: 07/17/1955 MRN: [id number]  Procedure: Tracheostomy  Pre Procedure Assessment: ET Tube Size:8.0 ET Tube secured at lip (cm):24 Bite block in place: No Breath Sounds: Rhonch  Post Procedure Assessment: BP 114/54 mmHg  Pulse 87  Temp(Src) 99.1 F (37.3 C) (Oral)  Resp 13  Ht 5\' 11"  (1.803 m)  Wt 229 lb 8 oz (104.1 kg)  BMI 32.02 kg/m2  SpO2 89% O2 sats: stable throughout, spo2 decreased pt now on fio2 100%, 14 peep      Complications: No apparent complications Patient did tolerate procedure well Tracheostomy Brand:Shiley Tracheostomy Style:Cuffed Tracheostomy Size: 6 Tracheostomy Secured JYN:[id number]:Sutures Tracheostomy Placement Confirmation:Trach cuff visualized and in place and Chest X ray ordered for placement    Cherylin Mylaroyle, Geanette Buonocore 04/20/2014, 3:11 PM

## 2014-04-20 NOTE — Progress Notes (Signed)
Elink notified of critical potassium 2.7. Orders received.

## 2014-04-20 NOTE — Progress Notes (Signed)
PULMONARY / CRITICAL CARE MEDICINE   Name: Kirke Breach MRN: [id number] DOB: [birth year]    ADMISSION DATE:  01-May-2014 CONSULTATION DATE:  05/01/2014  REFERRING MD :  Dr. Herbie Baltimore  CHIEF COMPLAINT:  STEMI  INITIAL PRESENTATION:  59 yo smoker with STEMI, cardiogenic shock, VDRF.  He is visiting [place] from [place].  STUDIES:  1/31 Cardiac cath >> severe CAD with occlusion of LAD, circumflex, RCS, ischemic CM >> DES to LAD, circumflex 1/31 Echo >> LVEF 20-25%, severely reduced systolic function 2/04 LHC > DES RCA 2/06 Echo >> EF 25 to 30%, mod LVH, grade 2 diastolic dysfx  SIGNIFICANT EVENTS: 1/31 admit, cardiac cath, impella 2/01 TCTS consulted, transfuse 1 unit PRBC 2/05 impella removed; ?upper GI bleeding  2/08 increased respiratory secretions 2/10 ARDS protocol, added levophed 2/11 start paralytic 2/12 - tried off nimbex, liberated to 7cc/kg   SUBJECTIVE:  Did not tolerate nimbex d/c yesterday.  Now back on paralytic, worsening oxygenation.  On 100%, peep 14.  Increased secretions.   VITAL SIGNS: Temp:  [97.5 F (36.4 C)-98.4 F (36.9 C)] 98.4 F (36.9 C) (02/19 0800) Pulse Rate:  [64-95] 72 (02/19 0900) Resp:  [0-31] 0 (02/19 0900) BP: (110-128)/(56-99) 111/60 mmHg (02/19 0746) SpO2:  [87 %-100 %] 93 % (02/19 0900) Arterial Line BP: (87-135)/(46-70) 107/56 mmHg (02/19 0900) FiO2 (%):  [40 %-60 %] 50 % (02/19 0800) Weight:  [104.1 kg (229 lb 8 oz)] 104.1 kg (229 lb 8 oz) (02/19 0459) HEMODYNAMICS: CVP:  [13 mmHg-17 mmHg] 13 mmHg VENTILATOR SETTINGS: Vent Mode:  [-] PRVC FiO2 (%):  [40 %-60 %] 50 % Set Rate:  [24 bmp] 24 bmp Vt Set:  [520 mL] 520 mL PEEP:  [8 cmH20-12 cmH20] 10 cmH20 Plateau Pressure:  [24 cmH20-31 cmH20] 27 cmH20 INTAKE / OUTPUT:  Intake/Output Summary (Last 24 hours) at 04/20/14 1033 Last data filed at 04/20/14 0900  Gross per 24 hour  Intake 2458.01 ml  Output   4425 ml  Net -1966.99 ml   PHYSICAL EXAMINATION:  Gen: withdraws to pain,  critically ill appearing  HEENT: Rt eye enucleated, ETT in place, copious ETT secretions  PULM: resps even on vent, coarse, diminished bases  CV: irregular AB: decreased bowel sounds, soft Ext: 1+ BLE edema  Neuro: RASS -3, no longer paralyzed  LABS:  CBC  Recent Labs Lab 04/18/14 0335 04/19/14 0411 04/20/14 0320  WBC 14.3* 14.3* 14.0*  HGB 9.3* 8.7* 9.1*  HCT 31.8* 29.1* 30.4*  PLT 356 324 317   Coag's  Recent Labs Lab 04/19/14 1530  APTT 23*  INR 1.20   BMET  Recent Labs Lab 04/19/14 0411 04/19/14 1933 04/20/14 0320  NA 143 143 144  K 2.1* 2.7* 3.6  CL 98 101 102  CO2 36* 34* 33*  BUN 95* 103* 107*  CREATININE 1.36* 1.41* 1.47*  GLUCOSE 139* 126* 118*   Electrolytes  Recent Labs Lab 04/18/14 0335 04/19/14 0411 04/19/14 1933 04/20/14 0320  CALCIUM 7.7* 7.8* 8.0* 8.0*  MG 2.3 2.2  --  2.4  PHOS 3.7 3.7  --  4.0   ABG  Recent Labs Lab 04/18/14 0333 04/19/14 0433 04/20/14 0316  PHART 7.452* 7.506* 7.479*  PCO2ART 48.3* 42.9 42.5  PO2ART 96.8 122.0* 82.8   Glucose  Recent Labs Lab 04/19/14 1126 04/19/14 1529 04/19/14 2019 04/19/14 2350 04/20/14 0446 04/20/14 0730  GLUCAP 142* 95 118* 127* 107* 96   ASSESSMENT / PLAN:  PULMONARY ETT 1/31>>> A:  Acute respiratory failure due to cardiogenic  shock and pulmonary edema.   MRSA HCAP 2/08 with severe ARDS. Tobacco abuse. P:   - Full vent support >> trach today. - Cont ARDS protocol. - Maintain off paralytics. - Attempt to lighten sedation. - Scheduled BD's >> changed to atrovent and xopenex in setting of tachycardia - Change lasix to 40 mg IV BID. - Postural drainage, bed percussion   CARDIOVASCULAR L Impella 1/31>>>2/5 Lt [place] CVL 2/08 >> A:  Cardiogenic shock post STEMI on admission - echo 2/7 EF 25-30%.  STEMI r/t stent occlusion, now s/p DES to LAD and RCA Septic shock developed from MRSA PNA 2/10. SVT, A fib with RVR. Cvp=13 2/14 P: - Amiodarone, brilinta per  cardiology - Add aldactone as a potassium sparing agent - Continue ASA, lipitor, digoxin per cardiology - Titrate levophed for MAP  RENAL A:  AKI in setting of cardiogenic/septic shock. Hypokalemia. Hypernatremia  P:   - Monitor BMET and UOP. - Replace electrolytes as indicated. - Continue free water to 250 ml q4 hrs. - Lasix 40 mg IV BID.  GASTROINTESTINAL A:   Nutrition. Constipation. ?upper GI bleed 2/05 >> no further episodes since. P:   - Protonix BID. - Bowel regimen. - TF per nutrition.  HEMATOLOGIC A:  Thrombocytopenia >> likely from impella >> resolved. Anemia of critical illness. P:  - F/u CBC. - SQ heparin for DVT prevention.  INFECTIOUS A: MRSA HCAP. Febrile overnight. P:    - Vanc 2/8>>> - Cefepime 2/17>>>  - Blood 2/08 >>ng - Sputum 2/08 >> MRSA - Blood 2/17>>>  ENDOCRINE A:   Hyperglycemia. P:   - SSI with lantus  NEUROLOGIC A:  Acute encephalopathy 2nd to respiratory failure, cardiogenic shock. P:   - RASS goal 0 to -2 now that paralytics are off - Minimize sedation as able.  UPDATES:  Family Updated on 2/18, none bedside, trach today.  Diureses as above.  The patient is critically ill with multiple organ systems failure and requires high complexity decision making for assessment and support, frequent evaluation and titration of therapies, application of advanced monitoring technologies and extensive interpretation of multiple databases.   Critical Care Time devoted to patient care services described in this note is  35  Minutes. This time reflects time of care of this signee Dr Koren BoundWesam Jannette Cotham. This critical care time does not reflect procedure time, or teaching time or supervisory time of PA/NP/Med student/Med Resident etc but could involve care discussion time.  Alyson ReedyWesam G. Blanchard Willhite, M.D. [facility] Pulmonary/Critical Care Medicine. Pager: [phone]. After hours pager: [phone].

## 2014-04-20 NOTE — Procedures (Signed)
Bronchoscopy Procedure Note Job FoundsFrederick Smalls [id number] 05/26/1955  Procedure: Bronchoscopy Indications: Diagnostic evaluation of the airways  Procedure Details Consent: Risks of procedure as well as the alternatives and risks of each were explained to the (patient/caregiver).  Consent for procedure obtained. Time Out: Verified patient identification, verified procedure, site/side was marked, verified correct patient position, special equipment/implants available, medications/allergies/relevent history reviewed, required imaging and test results available.  Performed  In preparation for procedure, patient was given 100% FiO2. Sedation: Benzodiazepines, Etomidate and Fentnyl and Vecuronium  Airway entered and the following bronchi were examined: RUL, RML, RLL, LUL, LLL and Bronchi.   Bronchoscope removed.    Evaluation Hemodynamic Status: BP stable throughout; O2 sats: stable throughout Patient's Current Condition: stable Specimens:  None Complications: No apparent complications Patient did tolerate procedure well.   Koren BoundYACOUB,WESAM 04/20/2014

## 2014-04-20 NOTE — Progress Notes (Signed)
Increased Peep to 14 per Dr. Molli KnockYacoub order, fio2 100% due to decreased spo2 during tracheostomy at bedside.  Bedside RT Marisue IvanLiz called and made aware.

## 2014-04-20 NOTE — Procedures (Signed)
Percutaneous Tracheostomy Placement  Consent from family.  Patient sedated, paralyzed and position.  Placed on 100% FiO2 and RR matched.  Area cleaned and draped.  Lidocaine/epi injected.  Skin incision done followed by blunt dissection.  Trachea palpated then punctured, catheter passed and visualized bronchoscopically.  Wire placed and visualized.  Catheter removed.  Airway then crushed and dilated.  Size 6 cuffed shiley trach placed and visualized bronchoscopically well above carina.  Good volume returns.  Patient tolerated the procedure well without complications.  Minimal blood loss.  CXR ordered and pending.  Mj Willis G. Jenya Putz, M.D. [facility] Pulmonary/Critical Care Medicine. Pager: [phone]. After hours pager: [phone]. 

## 2014-04-20 NOTE — Progress Notes (Signed)
eLink Physician Progress Note and Electrolyte Replacement  Patient Name: Micheal Ayala DOB: 07/03/1955 MRN: [id number]  Date of Service  04/20/2014   HPI/Events of Note    Recent Labs Lab 04/14/14 1104  04/17/14 0420 04/18/14 0335 04/19/14 0411 04/19/14 1933 04/20/14 0320 04/20/14 1910  NA  --   < > 150* 147* 143 143 144 144  K  --   < > 2.8* 2.4* 2.1* 2.7* 3.6 2.9*  CL  --   < > 110 105 98 101 102 98  CO2  --   < > 28 36* 36* 34* 33* 32  GLUCOSE  --   < > 151* 134* 139* 126* 118* 135*  BUN  --   < > 76* 84* 95* 103* 107* 104*  CREATININE  --   < > 1.31 1.25 1.36* 1.41* 1.47* 1.55*  CALCIUM  --   < > 7.3* 7.7* 7.8* 8.0* 8.0* 8.1*  MG  --   --  2.9* 2.3 2.2  --  2.4  --   PHOS 3.4  --  3.5 3.7 3.7  --  4.0  --   < > = values in this interval not displayed.  Estimated Creatinine Clearance: 63.8 mL/min (by C-G formula based on Cr of 1.55).  Intake/Output      02/19 0701 - 02/20 0700   I.V. (mL/kg) 768.4 (7.4)   Other    NG/GT    IV Piggyback 350   Total Intake(mL/kg) 1118.4 (10.7)   Urine (mL/kg/hr) 2250 (1.6)   Total Output 2250   Net -1131.6        - I/O DETAILED x 24h    Total I/O In: -  Out: 1150 [Urine:1150] - I/O THIS SHIFT    ASSESSMENT Low K  eICURN Interventions  kcl 40 po + 40 Po + 4o IV   ASSESSMENT: MAJOR ELECTROLYTE      Dr. Kalman ShanMurali Adeoluwa Silvers, M.D., [facility].C.P Pulmonary and Critical Care Medicine Staff Physician [facility] [place] Pulmonary and Critical Care Pager: [phone], If no answer or between  15:00h - 7:00h: call [phone]  04/20/2014 8:31 PM

## 2014-04-20 NOTE — Progress Notes (Signed)
Patient ID: Micheal Ayala, male   DOB: 03-May-1955, 59 y.o.   MRN: [id number]  Advanced Heart Failure Rounding Note   Subjective:    Impella removed 2/5.   Echo (2/7) with EF 25-30%, peri-apical akinesis, no LV thrombus noted, normal RV.   Failed paralytic wean on 2/13.   Remains intubated in setting of severe MRSA PNA and ARDS.  Yesterday had pulmonary lavage.  Pulmonary status much better. This am FiO2 cut back to 50%.  Tolerating well. Diuresing well. K being supped.  Remains on Norepi with dose up to 12.Marland Kitchen SBP ~100.Creatinine slightly worse.  CVP checked personally 11.   In NSR on IV amio  CXR reviewed - diffuse infiltrates and effusions. Unchanged.    Objective:   Weight Range:  Vital Signs:   Temp:  [97.5 F (36.4 C)-97.9 F (36.6 C)] 97.9 F (36.6 C) (02/19 0000) Pulse Rate:  [64-95] 69 (02/19 0500) Resp:  [0-30] 24 (02/19 0500) BP: (110-128)/(56-99) 111/58 mmHg (02/18 1920) SpO2:  [80 %-100 %] 98 % (02/19 0500) Arterial Line BP: (88-117)/(48-61) 95/54 mmHg (02/19 0500) FiO2 (%):  [40 %-60 %] 50 % (02/19 0528) Weight:  [104.1 kg (229 lb 8 oz)] 104.1 kg (229 lb 8 oz) (02/19 0459) Last BM Date: 04/18/14  Weight change: Filed Weights   04/18/14 0500 04/19/14 0500 04/20/14 0459  Weight: 104.9 kg (231 lb 4.2 oz) 105 kg (231 lb 7.7 oz) 104.1 kg (229 lb 8 oz)    Intake/Output:   Intake/Output Summary (Last 24 hours) at 04/20/14 0803 Last data filed at 04/20/14 0645  Gross per 24 hour  Intake 3060.33 ml  Output   4650 ml  Net -1589.67 ml     Physical Exam: General:  Intubated.Sedated HEENT: normal except for R eye enucleation  OGT and ETT Neck: supple.Carotids 2+ bilat; no bruits.   Cor: PMI laterally displaced. Regular  Lungs: coarse but improved Abdomen: soft, nontender, + distended. No hepatosplenomegaly. No bruits or masses. Hypoactive bowel sounds. Extremities: no cyanosis, clubbing, rash, 1-2+ edema. Neuro: awake on vent. agitated  Telemetry: Sinus  80s. intermittent AF  Labs: Basic Metabolic Panel:  Recent Labs Lab 04/14/14 1104  04/17/14 0420 04/18/14 0335 04/19/14 0411 04/19/14 1933 04/20/14 0320  NA  --   < > 150* 147* 143 143 144  K  --   < > 2.8* 2.4* 2.1* 2.7* 3.6  CL  --   < > 110 105 98 101 102  CO2  --   < > 28 36* 36* 34* 33*  GLUCOSE  --   < > 151* 134* 139* 126* 118*  BUN  --   < > 76* 84* 95* 103* 107*  CREATININE  --   < > 1.31 1.25 1.36* 1.41* 1.47*  CALCIUM  --   < > 7.3* 7.7* 7.8* 8.0* 8.0*  MG  --   --  2.9* 2.3 2.2  --  2.4  PHOS 3.4  --  3.5 3.7 3.7  --  4.0  < > = values in this interval not displayed.  Liver Function Tests: No results for input(s): AST, ALT, ALKPHOS, BILITOT, PROT, ALBUMIN in the last 168 hours. No results for input(s): LIPASE, AMYLASE in the last 168 hours. No results for input(s): AMMONIA in the last 168 hours.  CBC:  Recent Labs Lab 04/16/14 0400 04/17/14 0420 04/18/14 0335 04/19/14 0411 04/20/14 0320  WBC 14.7* 12.4* 14.3* 14.3* 14.0*  HGB 9.0* 8.6* 9.3* 8.7* 9.1*  HCT 31.0* 29.6* 31.8* 29.1*  30.4*  MCV 97.5 97.4 95.5 93.6 93.8  PLT 422* 397 356 324 317    Cardiac Enzymes: No results for input(s): CKTOTAL, CKMB, CKMBINDEX, TROPONINI in the last 168 hours.  BNP: BNP (last 3 results) No results for input(s): PROBNP in the last 8760 hours.   Other results:    Imaging: Dg Chest Port 1 View  04/20/2014   CLINICAL DATA:  Acute respiratory failure. Acute myocardial infarction.  EXAM: PORTABLE CHEST - 1 VIEW  COMPARISON:  04/19/2014 and 04/18/2014  FINDINGS: Endotracheal tube, central venous catheter and NG tube appear unchanged and a good position. Extensive bilateral pulmonary edema and right pleural effusion persist. Pulmonary vascularity is slightly less prominent.  IMPRESSION: Persistent extensive pulmonary edema and right effusion. Pulmonary vascularity has improved.   Electronically Signed   By: Francene Boyers M.D.   On: 04/20/2014 07:14   Dg Chest Port 1  View  04/19/2014   CLINICAL DATA:  Endotracheal tube.  EXAM: PORTABLE CHEST - 1 VIEW  COMPARISON:  04/18/2014  FINDINGS: The endotracheal tube tip is at the level of the clavicular heads. There is a nasogastric tube extending below the diaphragm and off the inferior edge of the image. There is a left subclavian central line with tip in the SVC. Diffuse airspace opacities persist bilaterally without significant interval change.  IMPRESSION: No significant interval change, with persistent diffuse airspace opacities.   Electronically Signed   By: Ellery Plunk M.D.   On: 04/19/2014 05:35   Dg Chest Port 1 View  04/18/2014   CLINICAL DATA:  Intubated patient.  EXAM: PORTABLE CHEST - 1 VIEW  COMPARISON:  Single view of the CS 04/18/2014 and 5:09 a.m.  FINDINGS: ET tube is in place with tip in good position, well above the carina. Left subclavian catheter and NG tube are also again seen, unchanged. Diffuse bilateral airspace disease persists. Heart size is upper normal. No pneumothorax identified.  IMPRESSION: Support tubes and lines projecting good position. No change in extensive bilateral airspace disease.   Electronically Signed   By: Drusilla Kanner M.D.   On: 04/18/2014 22:30     Medications:     Scheduled Medications: . antiseptic oral rinse  7 mL Mouth Rinse QID  . aspirin  81 mg Per Tube Daily  . atorvastatin  80 mg Per Tube q1800  . ceFEPime (MAXIPIME) IV  1 g Intravenous Q8H  . chlorhexidine  15 mL Mouth Rinse BID  . digoxin  0.25 mg Intravenous Daily  . docusate  100 mg Oral BID  . etomidate  40 mg Intravenous Once  . feeding supplement (PRO-STAT SUGAR FREE 64)  60 mL Per Tube TID  . feeding supplement (VITAL HIGH PROTEIN)  1,000 mL Per Tube Q24H  . fentaNYL  200 mcg Intravenous Once  . free water  250 mL Per Tube Q4H  . heparin subcutaneous  5,000 Units Subcutaneous Q8H  . insulin aspart  0-15 Units Subcutaneous 6 times per day  . insulin glargine  35 Units Subcutaneous Daily   . ipratropium  0.5 mg Nebulization Q6H  . levalbuterol  0.63 mg Nebulization Q6H  . midazolam  4 mg Intravenous Once  . multivitamin  5 mL Per Tube Daily  . pantoprazole sodium  40 mg Per Tube Q24H  . propofol  5-80 mcg/kg/min Intravenous Once  . QUEtiapine  100 mg Oral BID  . ticagrelor  90 mg Per Tube BID  . vancomycin  1,000 mg Intravenous Q12H  . vecuronium  10 mg Intravenous Once    Infusions: . amiodarone 30 mg/hr (04/20/14 0454)  . cisatracurium (NIMBEX) infusion Stopped (04/17/14 1115)  . fentaNYL infusion INTRAVENOUS 250 mcg/hr (04/20/14 0230)  . furosemide (LASIX) infusion 8 mg/hr (04/19/14 1007)  . midazolam (VERSED) infusion 2 mg/hr (04/19/14 0900)  . norepinephrine (LEVOPHED) Adult infusion 12 mcg/min (04/20/14 0645)    PRN Medications: Place/Maintain arterial line **AND** sodium chloride, acetaminophen (TYLENOL) oral liquid 160 mg/5 mL, bisacodyl, fentaNYL, Influenza vac split quadrivalent PF, levalbuterol, midazolam, ondansetron (ZOFRAN) IV, pneumococcal 23 valent vaccine, sennosides, sorbitol   Assessment:   1. Cardiogenic shock    --s/p Impella placement 1/31, Impella out 2/5.  2. Acute on chronic systolic HF    --EF 10% on echo    --Repeat echo (2/7) with EF 25-30%, peri-apical akinesis, no LV thrombus, normal RV 3. Anterolateral STEMI 1/31   --due to stent occlusion   --s/p PCI with DES to LAD and OM-2 on 1/31   --s/p PCI with DES to RCA 2/5 4. VDRF 5. CAD 6. Tobacco abuse ongoing 7. 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 Hypomagnesemia/hyponatremia 9. Anemia s/p 3u RBCs 10. SVT: Amiodarone started 11. MRSA Pneumonia, hospital acquired 12. Acute kidney injury 13. Hypernatremia  Plan/Discussion:    Main issue now is severe MRSA PNA and ARDS. Looks much better. FiO2 improved after pulmonary lavage and diuresis. CXR slightly better. For trach today.  HF currently stable. Continue norepi for BP support. Will stop lasix gtt today  if ok with CCM. Change to 40 IV bid starting tomorrow.  Continue ASA, statin, brilinta.  Continue IV amio for AF.   K being supped.   Family updated.   The patient is critically ill with multiple organ systems failure and requires high complexity decision making for assessment and support, frequent evaluation and titration of therapies, application of advanced monitoring technologies and extensive interpretation of multiple databases.   Critical Care Time devoted to patient care services described in this note is 35 Minutes.   Length of Stay: [id number] Arvilla Meresaniel Esbeydi Manago MD 04/20/2014, 8:03 AM  Advanced Heart Failure Team Pager [phone] (M-F; 7a - 4p)  Please contact [facility] for night-coverage after hours (4p -7a ) and weekends on [email or web address]

## 2014-04-21 ENCOUNTER — Inpatient Hospital Stay (HOSPITAL_COMMUNITY): Payer: Medicare Other

## 2014-04-21 DIAGNOSIS — J80 Acute respiratory distress syndrome: Secondary | ICD-10-CM | POA: Diagnosis not present

## 2014-04-21 LAB — CBC
HEMATOCRIT: 30.5 % — AB (ref 39.0–52.0)
Hemoglobin: 9.1 g/dL — ABNORMAL LOW (ref 13.0–17.0)
MCH: 28.2 pg (ref 26.0–34.0)
MCHC: 29.8 g/dL — ABNORMAL LOW (ref 30.0–36.0)
MCV: 94.4 fL (ref 78.0–100.0)
Platelets: 311 10*3/uL (ref 150–400)
RBC: 3.23 MIL/uL — ABNORMAL LOW (ref 4.22–5.81)
RDW: 19.4 % — ABNORMAL HIGH (ref 11.5–15.5)
WBC: 11.4 10*3/uL — ABNORMAL HIGH (ref 4.0–10.5)

## 2014-04-21 LAB — GLUCOSE, CAPILLARY
GLUCOSE-CAPILLARY: 145 mg/dL — AB (ref 70–99)
GLUCOSE-CAPILLARY: 107 mg/dL — AB (ref 70–99)
GLUCOSE-CAPILLARY: 154 mg/dL — AB (ref 70–99)
GLUCOSE-CAPILLARY: 145 mg/dL — AB (ref 70–99)
Glucose-Capillary: 207 mg/dL — ABNORMAL HIGH (ref 70–99)
Glucose-Capillary: 143 mg/dL — ABNORMAL HIGH (ref 70–99)

## 2014-04-21 LAB — POCT I-STAT 3, ART BLOOD GAS (G3+)
ACID-BASE EXCESS: 5 mmol/L — AB (ref 0.0–2.0)
Bicarbonate: 30.1 mEq/L — ABNORMAL HIGH (ref 20.0–24.0)
O2 Saturation: 94 %
Patient temperature: 98.6
TCO2: 31 mmol/L (ref 0–100)
pCO2 arterial: 47 mmHg — ABNORMAL HIGH (ref 35.0–45.0)
pH, Arterial: 7.414 (ref 7.350–7.450)
pO2, Arterial: 70 mmHg — ABNORMAL LOW (ref 80.0–100.0)

## 2014-04-21 LAB — BASIC METABOLIC PANEL
ANION GAP: 9 (ref 5–15)
BUN: 104 mg/dL — AB (ref 6–23)
CALCIUM: 8.2 mg/dL — AB (ref 8.4–10.5)
CO2: 32 mmol/L (ref 19–32)
Chloride: 103 mmol/L (ref 96–112)
Creatinine, Ser: 1.5 mg/dL — ABNORMAL HIGH (ref 0.50–1.35)
GFR calc Af Amer: 58 mL/min — ABNORMAL LOW (ref >90)
GFR calc non Af Amer: 50 mL/min — ABNORMAL LOW (ref >90)
GLUCOSE: 151 mg/dL — AB (ref 70–99)
Potassium: 3.8 mmol/L (ref 3.5–5.1)
Sodium: 144 mmol/L (ref 135–145)

## 2014-04-21 LAB — BLOOD GAS, ARTERIAL
Acid-Base Excess: 6.6 mmol/L — ABNORMAL HIGH (ref 0.0–2.0)
BICARBONATE: 30.1 meq/L — AB (ref 20.0–24.0)
FIO2: 0.6 %
MECHVT: 520 mL
O2 Saturation: 98.7 %
PATIENT TEMPERATURE: 98.6
PEEP/CPAP: 10 cmH2O
PO2 ART: 113 mmHg — AB (ref 80.0–100.0)
RATE: 24 resp/min
TCO2: 31.3 mmol/L (ref 0–100)
pCO2 arterial: 39.2 mmHg (ref 35.0–45.0)
pH, Arterial: 7.497 — ABNORMAL HIGH (ref 7.350–7.450)

## 2014-04-21 LAB — MAGNESIUM: MAGNESIUM: 2.4 mg/dL (ref 1.5–2.5)

## 2014-04-21 LAB — DIGOXIN LEVEL: Digoxin Level: 1.6 ng/mL (ref 0.8–2.0)

## 2014-04-21 LAB — PROCALCITONIN: Procalcitonin: 0.53 ng/mL

## 2014-04-21 LAB — VANCOMYCIN, TROUGH: VANCOMYCIN TR: 43.9 ug/mL — AB (ref 10.0–20.0)

## 2014-04-21 LAB — PHOSPHORUS: PHOSPHORUS: 3.6 mg/dL (ref 2.3–4.6)

## 2014-04-21 MED ORDER — SUCCINYLCHOLINE CHLORIDE 20 MG/ML IJ SOLN
INTRAMUSCULAR | Status: AC
  Filled 2014-04-21: qty 1

## 2014-04-21 MED ORDER — ETOMIDATE 2 MG/ML IV SOLN
INTRAVENOUS | Status: AC
  Administered 2014-04-21: 20 mg
  Filled 2014-04-21: qty 20

## 2014-04-21 MED ORDER — MIDAZOLAM HCL 2 MG/2ML IJ SOLN: 2.0000 mg | INTRAMUSCULAR | Status: DC | PRN

## 2014-04-21 MED ORDER — LIDOCAINE HCL (CARDIAC) 20 MG/ML IV SOLN
INTRAVENOUS | Status: AC
  Filled 2014-04-21: qty 5

## 2014-04-21 MED ORDER — ROCURONIUM BROMIDE 50 MG/5ML IV SOLN
INTRAVENOUS | Status: AC
  Administered 2014-04-21: 10 mg
  Filled 2014-04-21: qty 2

## 2014-04-21 MED ORDER — PROPOFOL 10 MG/ML IV EMUL
5.0000 ug/kg/min | INTRAVENOUS | Status: DC
  Administered 2014-04-21: 5 ug/kg/min via INTRAVENOUS
  Filled 2014-04-21: qty 100

## 2014-04-21 MED ORDER — FUROSEMIDE 10 MG/ML IJ SOLN
40.0000 mg | Freq: Three times a day (TID) | INTRAMUSCULAR | Status: DC
  Administered 2014-04-21 – 2014-04-27 (×18): 40 mg via INTRAVENOUS
  Filled 2014-04-21 (×20): qty 4

## 2014-04-21 MED ORDER — MIDAZOLAM HCL 5 MG/ML IJ SOLN
0.0000 mg/h | INTRAMUSCULAR | Status: DC
  Administered 2014-04-21 – 2014-04-23 (×2): 2 mg/h via INTRAVENOUS
  Filled 2014-04-21 (×4): qty 10

## 2014-04-21 NOTE — Progress Notes (Signed)
PULMONARY / CRITICAL CARE MEDICINE   Name: Micheal Ayala MRN: [id number] DOB: [birth year]    ADMISSION DATE:  04/23/2014 CONSULTATION DATE:  04-23-2014  REFERRING MD :  Dr. Herbie Baltimore  CHIEF COMPLAINT:  STEMI  INITIAL PRESENTATION:  59 yo smoker with STEMI, cardiogenic shock, VDRF.  He is visiting [place] from [place].  STUDIES:  1/31 Cardiac cath >> severe CAD with occlusion of LAD, circumflex, RCS, ischemic CM >> DES to LAD, circumflex 1/31 Echo >> LVEF 20-25%, severely reduced systolic function 2/04 LHC > DES RCA 2/06 Echo >> EF 25 to 30%, mod LVH, grade 2 diastolic dysfx  SIGNIFICANT EVENTS: 1/31 admit, cardiac cath, impella 2/01 TCTS consulted, transfuse 1 unit PRBC 2/05 impella removed; ?upper GI bleeding  2/08 increased respiratory secretions 2/10 ARDS protocol, added levophed 2/11 start paralytic 2/12 - tried off nimbex, liberated to 7cc/kg  2/19- trach placed  SUBJECTIVE:  Levo at 18 remains, aline dc'ed   VITAL SIGNS: Temp:  [97.6 F (36.4 C)-99.1 F (37.3 C)] 97.6 F (36.4 C) (02/20 0400) Pulse Rate:  [29-103] 71 (02/20 0700) Resp:  [0-33] 24 (02/20 0700) BP: (90-130)/(54-67) 90/54 mmHg (02/20 0700) SpO2:  [84 %-100 %] 96 % (02/20 0700) Arterial Line BP: (87-158)/(46-82) 122/60 mmHg (02/20 0500) FiO2 (%):  [50 %-100 %] 50 % (02/20 0600) Weight:  [103.7 kg (228 lb 9.9 oz)] 103.7 kg (228 lb 9.9 oz) (02/20 0332) HEMODYNAMICS: CVP:  [10 mmHg-14 mmHg] 14 mmHg VENTILATOR SETTINGS: Vent Mode:  [-] PRVC FiO2 (%):  [50 %-100 %] 50 % Set Rate:  [24 bmp] 24 bmp Vt Set:  [520 mL] 520 mL PEEP:  [10 cmH20-14 cmH20] 10 cmH20 Plateau Pressure:  [25 cmH20-31 cmH20] 26 cmH20 INTAKE / OUTPUT:  Intake/Output Summary (Last 24 hours) at 04/21/14 0749 Last data filed at 04/21/14 0600  Gross per 24 hour  Intake 2781.2 ml  Output   3525 ml  Net -743.8 ml   PHYSICAL EXAMINATION:  Gen: trach placed HEENT: Rt eye enucleated, followed commands for RN PULM: rcoarse CV:  irregular s1 s 2RR AB: decreased bowel sounds, soft Ext: 1+ BLE edema  Neuro: RASS -2, no longer paralyzed  LABS:  CBC  Recent Labs Lab 04/19/14 0411 04/20/14 0320 04/21/14 0340  WBC 14.3* 14.0* 11.4*  HGB 8.7* 9.1* 9.1*  HCT 29.1* 30.4* 30.5*  PLT 324 317 311   Coag's  Recent Labs Lab 04/19/14 1530  APTT 23*  INR 1.20   BMET  Recent Labs Lab 04/20/14 0320 04/20/14 1910 04/21/14 0340  NA 144 144 144  K 3.6 2.9* 3.8  CL 102 98 103  CO2 33* 32 32  BUN 107* 104* 104*  CREATININE 1.47* 1.55* 1.50*  GLUCOSE 118* 135* 151*   Electrolytes  Recent Labs Lab 04/19/14 0411  04/20/14 0320 04/20/14 1910 04/21/14 0340  CALCIUM 7.8*  < > 8.0* 8.1* 8.2*  MG 2.2  --  2.4  --  2.4  PHOS 3.7  --  4.0  --  3.6  < > = values in this interval not displayed. ABG  Recent Labs Lab 04/19/14 0433 04/20/14 0316 04/21/14 0323  PHART 7.506* 7.479* 7.497*  PCO2ART 42.9 42.5 39.2  PO2ART 122.0* 82.8 113.0*   Glucose  Recent Labs Lab 04/19/14 2350 04/20/14 0446 04/20/14 0730 04/20/14 1132 04/21/14 0330 04/21/14 0736  GLUCAP 127* 107* 96 141* 145* 107*   ASSESSMENT / PLAN:  PULMONARY ETT 1/31>>> A:  Acute respiratory failure due to cardiogenic shock and pulmonary edema.  MRSA HCAP 2/08 with severe ARDS. Tobacco abuse. P:   - Cont ARDS protocol, keep plat less 30  - vent setting reviewed, peep remains 10, but likley can reduce  After we get to 40% -rate reduction required -atrovent and xopenex in setting of tachycardia - lasix to neg balance goals remain -goal to peep 5 40% , likely, if successful with levo steady or reduced can SBT  CARDIOVASCULAR L Impella 1/31>>>2/5 Lt [place] CVL 2/08 >> A:  Cardiogenic shock post STEMI on admission - echo 2/7 EF 25-30%.  STEMI r/t stent occlusion, now s/p DES to LAD and RCA Septic shock developed from MRSA PNA 2/10. SVT, A fib with RVR. Cvp=13 2/14 P: - Amiodarone, brilinta per cardiology, do we still need amio  drip? - Continue ASA, lipitor, digoxin per cardiology - Titrate levophed for MAP, with EF 35%, consider LOWER MAP goals  RENAL A:  AKI in setting of cardiogenic/septic shock. Hypokalemia. Hypernatremia  P:   - Monitor BMET and UOP. - Replace electrolytes as indicated. - Continue free water to 250 ml q4 hrs, low threshold to dc this - Lasix 40 mg IV BID, per cards, consider to maintain, was 675 cc neg  GASTROINTESTINAL A:   Nutrition. Constipation. ?upper GI bleed 2/05 >> no further episodes since. P:   - Protonix BID. - Bowel regimen. - TF per nutrition. -with improved O2, would NOT peg at this stage  HEMATOLOGIC A:  Thrombocytopenia >> likely from impella >> resolved. Anemia of critical illness. P:  - F/u CBC. - SQ heparin for DVT prevention.  INFECTIOUS A: MRSA HCAP. Febrile overnight. P:    - Vanc 2/8>>> - Cefepime 2/17>>>  - Blood 2/08 >>ng - Sputum 2/08 >> MRSA - Blood 2/17>>>  Consider dc cefepime in am if continued good clinical status Assess pct as well  ENDOCRINE A:   Hyperglycemia. P:   - SSI with lantus  NEUROLOGIC A:  Acute encephalopathy 2nd to respiratory failure, cardiogenic shock. P:   - RASS goal 0  now that paralytics are off - Minimize sedation as able. -dc versed drip, add versed  -goal to reduce drips fent  Ccm time 30 min   Mcarthur Rossettianiel J. Tyson [name], MD, FACP Pgr: [phone] [facility]

## 2014-04-21 NOTE — Progress Notes (Signed)
ANTIBIOTIC CONSULT NOTE - FOLLOW UP  Pharmacy Consult for Vancomycin  Indication: MRSA PNA  No Known Allergies  Patient Measurements: Height: 5\' 11"  (180.3 cm) Weight: 228 lb 9.9 oz (103.7 kg) IBW/kg (Calculated) : 75.3  Vital Signs: Temp: 98.2 F (36.8 C) (02/20 2000) Temp Source: Oral (02/20 2000) BP: 113/63 mmHg (02/20 2200) Pulse Rate: 97 (02/20 2200)  Labs:  Recent Labs  04/19/14 0411  04/20/14 0320 04/20/14 1910 04/21/14 0340  WBC 14.3*  --  14.0*  --  11.4*  HGB 8.7*  --  9.1*  --  9.1*  PLT 324  --  317  --  311  CREATININE 1.36*  < > 1.47* 1.55* 1.50*  < > = values in this interval not displayed. Estimated Creatinine Clearance: 65.8 mL/min (by C-G formula based on Cr of 1.5).  Recent Labs  04/21/14 2130  VANCOTROUGH 43.9*    Assessment: Supra-therapeutic vancomycin trough (drawn correctly)  Goal of Therapy:  Vancomycin trough level 15-20 mcg/ml  Plan:  -Hold vancomycin for now -Check random vancomycin level in ~24 hours   Abran DukeLedford, Brynda Heick 04/21/2014,10:41 PM

## 2014-04-21 NOTE — Procedures (Signed)
Bronchoscopy Procedure Note Micheal Ayala [id number] 12/04/1955  Procedure: Bronchoscopy Indications: Diagnostic evaluation of the airways and to assess ett then observe trach and then assess post trach  Procedure Details Consent: Unable to obtain consent because of emergent medical necessity. Time Out: Verified patient identification, verified procedure, site/side was marked, verified correct patient position, special equipment/implants available, medications/allergies/relevent history reviewed, required imaging and test results available.  Performed  In preparation for procedure, patient was given 100% FiO2. Sedation: Benzodiazepines and Muscle relaxants  Airway entered and the following bronchi were examined: Bronchi.   Procedures performed: Brushings performed no Bronchoscope removed.  , Patient placed back on 100% FiO2 at conclusion of procedure.    Evaluation Hemodynamic Status: Transient hypotension treated with pressors; O2 sats: transiently fell during during procedure Patient's Current Condition: stable Specimens:  None Complications: No apparent complications Patient did tolerate procedure well.   Micheal Ayala,Micheal J. 04/21/2014   1. Placed through ett , back ett up to above trach site hole 2. Observed placement of wire, glider and dilator then trach 3. Placed in new trach, wnl  Micheal Rossettianiel J. Tyson [name], MD, FACP Pgr: [phone] [facility]

## 2014-04-21 NOTE — Progress Notes (Signed)
eLink Physician-Brief Progress Note Patient Name: Micheal Ayala DOB: 02/05/1956 MRN: [id number]   Date of Service  04/21/2014  HPI/Events of Note  Left radial arterial line site erythematous and edematous.  eICU Interventions  Remove arterial line     Intervention Category Intermediate Interventions: Infection - evaluation and management  Micheal Ayala, Micheal Ayala, P 04/21/2014, 5:41 AM

## 2014-04-21 NOTE — Progress Notes (Signed)
Patient ID: Micheal Ayala, male   DOB: 25-Apr-1955, 59 y.o.   MRN: [id number]  Advanced Heart Failure Rounding Note   Subjective:    Impella removed 2/5.  Echo (2/7) with EF 25-30%, peri-apical akinesis, no LV thrombus noted, normal RV.   Underwent trach 2/16 due to severe MRSA PNA and ARDS.  Awake this am. Following commands. Agitated though. Respiratory status worse and had to be bumped up to 100% with PEEP 14. Bronch yesterday with thin bloody secretions. Heparin stopped.   Remains on Norepi. CVP now 15. Lasix increased  In NSR on IV amio  CXR reviewed - diffuse infiltrates and effusions. Unchanged.    Objective:   Weight Range:  Vital Signs:   Temp:  [97 F (36.1 C)-99.1 F (37.3 C)] 97 F (36.1 C) (02/20 0800) Pulse Rate:  [29-103] 100 (02/20 0900) Resp:  [9-34] 34 (02/20 0900) BP: (90-130)/(54-74) 112/66 mmHg (02/20 0900) SpO2:  [84 %-100 %] 95 % (02/20 0900) Arterial Line BP: (93-158)/(48-82) 122/60 mmHg (02/20 0500) FiO2 (%):  [40 %-100 %] 40 % (02/20 0747) Weight:  [103.7 kg (228 lb 9.9 oz)] 103.7 kg (228 lb 9.9 oz) (02/20 0332) Last BM Date: 04/18/14  Weight change: Filed Weights   04/19/14 0500 04/20/14 0459 04/21/14 0332  Weight: 105 kg (231 lb 7.7 oz) 104.1 kg (229 lb 8 oz) 103.7 kg (228 lb 9.9 oz)    Intake/Output:   Intake/Output Summary (Last 24 hours) at 04/21/14 0942 Last data filed at 04/21/14 0800  Gross per 24 hour  Intake 2698.92 ml  Output   3175 ml  Net -476.08 ml     Physical Exam: General:  On trach. Awake and agitated  HEENT: normal except for R eye enucleation  OGT and ETT Neck: supple.Carotids 2+ bilat; no bruits.   Cor: PMI laterally displaced. Regular  Lungs: diffuse rhonchi Abdomen: soft, nontender, + distended. No hepatosplenomegaly. No bruits or masses. Hypoactive bowel sounds. Extremities: no cyanosis, clubbing, rash, 1-2+ edema. Neuro: awake on vent. agitated  Telemetry: Sinus 80-100  Labs: Basic Metabolic  Panel:  Recent Labs Lab 04/17/14 0420 04/18/14 0335 04/19/14 0411 04/19/14 1933 04/20/14 0320 04/20/14 1910 04/21/14 0340  NA 150* 147* 143 143 144 144 144  K 2.8* 2.4* 2.1* 2.7* 3.6 2.9* 3.8  CL 110 105 98 101 102 98 103  CO2 28 36* 36* 34* 33* 32 32  GLUCOSE 151* 134* 139* 126* 118* 135* 151*  BUN 76* 84* 95* 103* 107* 104* 104*  CREATININE 1.31 1.25 1.36* 1.41* 1.47* 1.55* 1.50*  CALCIUM 7.3* 7.7* 7.8* 8.0* 8.0* 8.1* 8.2*  MG 2.9* 2.3 2.2  --  2.4  --  2.4  PHOS 3.5 3.7 3.7  --  4.0  --  3.6    Liver Function Tests: No results for input(s): AST, ALT, ALKPHOS, BILITOT, PROT, ALBUMIN in the last 168 hours. No results for input(s): LIPASE, AMYLASE in the last 168 hours. No results for input(s): AMMONIA in the last 168 hours.  CBC:  Recent Labs Lab 04/17/14 0420 04/18/14 0335 04/19/14 0411 04/20/14 0320 04/21/14 0340  WBC 12.4* 14.3* 14.3* 14.0* 11.4*  HGB 8.6* 9.3* 8.7* 9.1* 9.1*  HCT 29.6* 31.8* 29.1* 30.4* 30.5*  MCV 97.4 95.5 93.6 93.8 94.4  PLT 397 356 324 317 311    Cardiac Enzymes: No results for input(s): CKTOTAL, CKMB, CKMBINDEX, TROPONINI in the last 168 hours.  BNP: BNP (last 3 results) No results for input(s): PROBNP in the last 8760 hours.  Other results:    Imaging: Dg Chest Port 1 View  04/21/2014   CLINICAL DATA:  59 year old male with a history of endotracheal tube placement.  EXAM: PORTABLE CHEST - 1 VIEW  COMPARISON:  Multiple prior including 04/20/2014, 04/19/2014 04/18/2014  FINDINGS: Cardiomediastinal silhouette unchanged since the prior, partially obscured by overlying lung and pleural disease.  Right rotation of the patient accentuates the left mediastinal border and left hilar structures.  Unchanged position of the tracheostomy tube, terminating over the tracheal stripe.  Interval removal of the coaxial wire from enteric tube. Enteric tube projects over the mediastinum and terminates out of the field view with non visualization of the  tip.  Unchanged position of left upper extremity PICC  Persistent right greater than left interstitial and airspace disease. Partial obscuration the right hemidiaphragm. Node visualized pneumothorax.  IMPRESSION: Similar appearance of right greater than left interstitial and airspace disease, potentially reflecting a combination of pneumonia and/or asymmetric edema, with differential including evolving ARDS.  Unchanged position of tracheostomy tube.  Interval removal of coaxial wire from the enteric tube, which terminates out of the field of view. Unchanged position of left upper extremity PICC.  Signed,  Yvone Neu. Loreta Ave, DO  [facility]  [facility]   Electronically Signed   By: Gilmer Mor D.O.   On: 04/21/2014 07:12   Dg Chest Port 1 View  04/20/2014   CLINICAL DATA:  Endotracheal tube placement  EXAM: PORTABLE CHEST - 1 VIEW  COMPARISON:  04/20/2014  FINDINGS: Cardiomediastinal silhouette is stable. Endotracheal tube has been removed. Tracheostomy tube in place. Persistent pulmonary edema and small right pleural effusion. NG tube in place with tip within proximal stomach. No pneumothorax  IMPRESSION: Tracheostomy tube in place. No pneumothorax. Persistent pulmonary edema and small right pleural effusion. NG tube in place.   Electronically Signed   By: Natasha Mead M.D.   On: 04/20/2014 17:43   Dg Chest Port 1 View  04/20/2014   CLINICAL DATA:  Acute respiratory failure. Acute myocardial infarction.  EXAM: PORTABLE CHEST - 1 VIEW  COMPARISON:  04/19/2014 and 04/18/2014  FINDINGS: Endotracheal tube, central venous catheter and NG tube appear unchanged and a good position. Extensive bilateral pulmonary edema and right pleural effusion persist. Pulmonary vascularity is slightly less prominent.  IMPRESSION: Persistent extensive pulmonary edema and right effusion. Pulmonary vascularity has improved.   Electronically Signed   By: Francene Boyers M.D.   On: 04/20/2014  07:14   Dg Abd Portable 1v  04/20/2014   CLINICAL DATA:  Feeding tube placement, history MI  EXAM: PORTABLE ABDOMEN ONE VIEW:  COMPARISON:  Portable exam 1728 hours compared to 04/04/2014  FINDINGS: Tip of feeding tube projects over proximal to mid stomach.  Bowel gas pattern normal.  Osseous structures unremarkable.  No pathologic calcifications.  IMPRESSION: Tip of feeding tube projects over the proximal to mid stomach.   Electronically Signed   By: Ulyses Southward M.D.   On: 04/20/2014 17:43     Medications:     Scheduled Medications: . antiseptic oral rinse  7 mL Mouth Rinse QID  . aspirin  81 mg Per Tube Daily  . atorvastatin  80 mg Per Tube q1800  . ceFEPime (MAXIPIME) IV  1 g Intravenous Q8H  . chlorhexidine  15 mL Mouth Rinse BID  . digoxin  0.25 mg Intravenous Daily  . docusate  100 mg Oral BID  . feeding supplement (PRO-STAT SUGAR FREE 64)  60 mL Per Tube TID  .  feeding supplement (VITAL HIGH PROTEIN)  1,000 mL Per Tube Q24H  . free water  250 mL Per Tube Q4H  . furosemide  40 mg Intravenous Q8H  . insulin aspart  0-15 Units Subcutaneous 6 times per day  . insulin glargine  35 Units Subcutaneous Daily  . ipratropium  0.5 mg Nebulization Q6H  . levalbuterol  0.63 mg Nebulization Q6H  . multivitamin  5 mL Per Tube Daily  . pantoprazole sodium  40 mg Per Tube Q24H  . propofol  5-80 mcg/kg/min Intravenous Once  . QUEtiapine  100 mg Oral BID  . ticagrelor  90 mg Per Tube BID  . vancomycin  1,000 mg Intravenous Q12H  . vecuronium  10 mg Intravenous Once    Infusions: . amiodarone 30 mg/hr (04/21/14 [id number])  . cisatracurium (NIMBEX) infusion Stopped (04/17/14 1115)  . fentaNYL infusion INTRAVENOUS 100 mcg/hr (04/21/14 0919)  . norepinephrine (LEVOPHED) Adult infusion 18.027 mcg/min (04/21/14 0800)  . propofol      PRN Medications: Place/Maintain arterial line **AND** sodium chloride, acetaminophen (TYLENOL) oral liquid 160 mg/5 mL, bisacodyl, fentaNYL, Influenza vac split  quadrivalent PF, levalbuterol, ondansetron (ZOFRAN) IV, pneumococcal 23 valent vaccine, sennosides, sorbitol   Assessment:   1. Cardiogenic shock    --s/p Impella placement 1/31, Impella out 2/5.  2. Acute on chronic systolic HF    --EF 10% on echo    --Repeat echo (2/7) with EF 25-30%, peri-apical akinesis, no LV thrombus, normal RV 3. Anterolateral STEMI 1/31   --due to stent occlusion   --s/p PCI with DES to LAD and OM-2 on 1/31   --s/p PCI with DES to RCA 2/5 4. VDRF 5. CAD 6. Tobacco abuse ongoing 7. 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 Hypomagnesemia/hyponatremia 9. Anemia s/p 3u RBCs 10. SVT: Amiodarone started 11. MRSA Pneumonia, hospital acquired 12. Acute kidney injury 13. Hypernatremia  Plan/Discussion:    Main issue now is severe MRSA PNA and ARDS. S/p trach yesterday   HF currently stable. Continue norepi for BP support. Agree with restarting lasix.  Continue ASA, statin, brilinta.  Continue IV amio for AF.   K+ improved.    The patient is critically ill with multiple organ systems failure and requires high complexity decision making for assessment and support, frequent evaluation and titration of therapies, application of advanced monitoring technologies and extensive interpretation of multiple databases.   Critical Care Time devoted to patient care services described in this note is 35 Minutes.   Length of Stay: 20 Arvilla Meresaniel Fatina Sprankle MD 04/21/2014, 9:42 AM  Advanced Heart Failure Team Pager [phone] (M-F; 7a - 4p)  Please contact [facility] for night-coverage after hours (4p -7a ) and weekends on [email or web address]

## 2014-04-21 NOTE — Procedures (Signed)
Name:  Micheal Ayala MRN:  [id number] DOB:  10/04/1955  OPERATIVE NOTE  Procedure:  Percutaneous tracheostomy.  Indications:  Ventilator-dependent respiratory failure- huge cuff leak on trach ,desat Procedure summary:  Appropriate equipment was assembled.  The patient was identified as Micheal Ayala and safety timeout was performed. The patient was placed in supine position with a towel roll behind shoulder blades and neck extended.  Sterile technique was used best could under emergent circumstances. The patient's neck and upper chest were prepped using chlorhexidine / alcohol scrub and the field was draped in usual sterile fashion with full body drape. 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  sheeth placed in airway fistula without needle needed. Following this, a guidewire was inserted. The needle was removed, leaving the sheath and the guidewire intact.The tracheal rings were then dilated. A 6 Shiley was then opened. The balloon was checked. It was placed over a tracheal dilator, which was then advanced over the guidewire and through the previously dilated tract. The Shiley tracheostomy tube was noted to pass in the trachea with little resistance. The guidewire and dilator tubes were removed from the trachea. An inner cannula was placed through the tracheostomy tube. The tracheostomy was then secured at the anterior neck with 4 monofilament sutures. The oral endotracheal tube was removed and the ventilator was attached to the newly placed tracheostomy tube. Adequate tidal volumes were noted. The cuff was inflated and no evidence of air leak was noted. No evidence of bleeding was noted. At this point, the procedure was concluded. Post-procedure chest x-ray was ordered.  Complications:  No immediate complications were noted.  Hemodynamic parameters and oxygenation remained  stable throughout the procedure.  Estimated blood loss:  15 mL.  Nelda BucksFEINSTEIN,Stavros Cail J., MD Pulmonary and Critical Care Medicine [facility] Pager: [phone]  04/21/2014, 11:48 AM  When I arrived they were bagging, concern would be some air in mediastinum from this

## 2014-04-21 NOTE — Procedures (Signed)
Intubation Procedure Note Job FoundsFrederick Sulkowski [id number] 01/03/1956  Procedure: Intubation Indications: Respiratory insufficiency  Procedure Details Consent: Unable to obtain consent because of emergent medical necessity. Time Out: Verified patient identification, verified procedure, site/side was marked, verified correct patient position, special equipment/implants available, medications/allergies/relevent history reviewed, required imaging and test results available.  Performed  Maximum sterile technique was used including gown, hand hygiene and mask.  MAC and 4    Evaluation Hemodynamic Status: BP stable throughout; O2 sats: transiently fell during during procedure Patient's Current Condition: unstable Complications: No apparent complications Patient did tolerate procedure well. Chest X-ray ordered to verify placement.  CXR: tube position acceptable.   Nelda BucksFEINSTEIN,Melizza Kanode J. 04/21/2014

## 2014-04-21 NOTE — Progress Notes (Signed)
ANTIBIOTIC CONSULT NOTE - FOLLOW UP  Pharmacy Consult for vancomycin/cefepime Indication: MRSA pneumonia  No Known Allergies  Patient Measurements: Height:  (180.3 cm) Weight: 228 lb 9.9 oz (103.7 kg) IBW/kg (Calculated) : 75.3  Vital Signs: Temp: 97 F (36.1 C) (02/20 0800) Temp Source: Oral (02/20 0800) BP: 83/52 mmHg (02/20 1300) Pulse Rate: 73 (02/20 1300) Intake/Output from previous day: 02/19 0701 - 02/20 0700 In: 2947.3 [I.V.:1589.7; NG/GT:357.6; IV Piggyback:800] Out: 3525 [Urine:3525] Intake/Output from this shift: Total I/O In: 1366.8 [I.V.:391.8; NG/GT:775; IV Piggyback:200] Out: -   Labs:  Recent Labs  04/19/14 0411  04/20/14 0320 04/20/14 1910 04/21/14 0340  WBC 14.3*  --  14.0*  --  11.4*  HGB 8.7*  --  9.1*  --  9.1*  PLT 324  --  317  --  311  CREATININE 1.36*  < > 1.47* 1.55* 1.50*  < > = values in this interval not displayed. Estimated Creatinine Clearance: 65.8 mL/min (by C-G formula based on Cr of 1.5). No results for input(s): VANCOTROUGH, VANCOPEAK, VANCORANDOM, GENTTROUGH, GENTPEAK, GENTRANDOM, TOBRATROUGH, TOBRAPEAK, TOBRARND, AMIKACINPEAK, AMIKACINTROU, AMIKACIN in the last 72 hours.   Microbiology: Recent Results (from the past 720 hour(s))  MRSA PCR Screening     Status: None   Collection Time: 03/27/2014  8:01 AM  Result Value Ref Range Status   MRSA by PCR NEGATIVE NEGATIVE Final    Comment:        The GeneXpert MRSA Assay (FDA approved for NASAL specimens only), is one component of a comprehensive MRSA colonization surveillance program. It is not intended to diagnose MRSA infection nor to guide or monitor treatment for MRSA infections.   Culture, blood (routine x 2)     Status: None   Collection Time: 04/02/14  1:32 PM  Result Value Ref Range Status   Specimen Description BLOOD RIGHT HAND  Final   Special Requests BOTTLES DRAWN AEROBIC AND ANAEROBIC 10CC  Final   Culture   Final    NO GROWTH 5 DAYS Note: Culture  results may be compromised due to an excessive volume of blood received in culture bottles. Performed at [facility]    Report Status 04/08/2014 FINAL  Final  Culture, blood (routine x 2)     Status: None   Collection Time: 04/02/14  2:12 PM  Result Value Ref Range Status   Specimen Description BLOOD RIGHT ARM  Final   Special Requests BOTTLES DRAWN AEROBIC AND ANAEROBIC 5CC  Final   Culture   Final    NO GROWTH 5 DAYS Performed at [facility]    Report Status 04/08/2014 FINAL  Final  Culture, respiratory (NON-Expectorated)     Status: None   Collection Time: 04/09/14  9:25 AM  Result Value Ref Range Status   Specimen Description TRACHEAL ASPIRATE  Final   Special Requests Normal  Final   Gram Stain   Final    MODERATE WBC PRESENT, PREDOMINANTLY PMN NO SQUAMOUS EPITHELIAL CELLS SEEN FEW GRAM POSITIVE COCCI IN PAIRS IN CLUSTERS Performed at [facility]    Culture   Final    ABUNDANT METHICILLIN RESISTANT STAPHYLOCOCCUS AUREUS Note: RIFAMPIN AND GENTAMICIN SHOULD NOT BE USED AS SINGLE DRUGS FOR TREATMENT OF STAPH INFECTIONS. CRITICAL RESULT CALLED TO, READ BACK BY AND VERIFIED WITH: ERIN S@7 :30AM ON 04/11/14 BY DANTS Performed at [facility]    Report Status 04/11/2014 FINAL  Final   Organism ID, Bacteria METHICILLIN RESISTANT STAPHYLOCOCCUS AUREUS  Final  Susceptibility   Methicillin resistant staphylococcus aureus - MIC*    CLINDAMYCIN >=8 RESISTANT Resistant     ERYTHROMYCIN >=8 RESISTANT Resistant     GENTAMICIN <=0.5 SENSITIVE Sensitive     LEVOFLOXACIN >=8 RESISTANT Resistant     OXACILLIN >=4 RESISTANT Resistant     PENICILLIN >=0.5 RESISTANT Resistant     RIFAMPIN <=0.5 SENSITIVE Sensitive     TRIMETH/SULFA <=10 SENSITIVE Sensitive     VANCOMYCIN <=0.5 SENSITIVE Sensitive     TETRACYCLINE <=1 SENSITIVE Sensitive     * ABUNDANT METHICILLIN RESISTANT STAPHYLOCOCCUS AUREUS  Culture, blood (routine x 2)     Status: None    Collection Time: 04/09/14 11:23 AM  Result Value Ref Range Status   Specimen Description BLOOD LEFT HAND  Final   Special Requests BOTTLES DRAWN AEROBIC AND ANAEROBIC 10CC  Final   Culture   Final    NO GROWTH 5 DAYS [facility]    Report Status 04/15/2014 FINAL  Final  Culture, blood (routine x 2)     Status: None   Collection Time: 04/09/14 11:35 AM  Result Value Ref Range Status   Specimen Description BLOOD RIGHT ARM  Final   Special Requests BOTTLES DRAWN AEROBIC ONLY 6CC  Final   Culture   Final    NO GROWTH 5 DAYS [facility] at [facility]    Report Status 04/15/2014 FINAL  Final  Clostridium Difficile by PCR     Status: None   Collection Time: 04/17/14  4:46 PM  Result Value Ref Range Status   C difficile by pcr NEGATIVE NEGATIVE Final  Culture, blood (routine x 2)     Status: None (Preliminary result)   Collection Time: 04/18/14  8:50 AM  Result Value Ref Range Status   Specimen Description BLOOD LEFT HAND  Final   Special Requests BOTTLES DRAWN AEROBIC AND ANAEROBIC 10CC  Final   Culture   Final           BLOOD CULTURE RECEIVED NO GROWTH TO DATE CULTURE WILL BE HELD FOR 5 DAYS BEFORE ISSUING A FINAL NEGATIVE REPORT [facility]    Report Status PENDING  Incomplete  Culture, blood (routine x 2)     Status: None (Preliminary result)   Collection Time: 04/18/14  8:57 AM  Result Value Ref Range Status   Specimen Description BLOOD RIGHT HAND  Final   Special Requests BOTTLES DRAWN AEROBIC AND ANAEROBIC 10CC  Final   Culture   Final           BLOOD CULTURE RECEIVED NO GROWTH TO DATE CULTURE WILL BE HELD FOR 5 DAYS BEFORE ISSUING A FINAL NEGATIVE REPORT [facility]    Report Status PENDING  Incomplete    Anti-infectives    Start     Dose/Rate Route Frequency Ordered Stop   04/18/14 0900  ceFEPIme (MAXIPIME) 1 g in dextrose 5 % 50 mL IVPB     1 g 100 mL/hr over 30 Minutes Intravenous Every 8  hours 04/18/14 0836     04/13/14 2200  vancomycin (VANCOCIN) IVPB 1000 mg/200 mL premix     1,000 mg 200 mL/hr over 60 Minutes Intravenous Every 12 hours 04/13/14 1316 04/23/14 2359   04/12/14 2200  vancomycin (VANCOCIN) IVPB 750 mg/150 ml premix  Status:  Discontinued     750 mg 150 mL/hr over 60 Minutes Intravenous Every 12 hours 04/12/14 0953 04/13/14 1316   04/09/14 2100  vancomycin (VANCOCIN)  IVPB 1000 mg/200 mL premix  Status:  Discontinued     1,000 mg 200 mL/hr over 60 Minutes Intravenous Every 12 hours 04/09/14 0925 04/12/14 0953   04/09/14 1100  piperacillin-tazobactam (ZOSYN) IVPB 3.375 g  Status:  Discontinued     3.375 g 12.5 mL/hr over 240 Minutes Intravenous 3 times per day 04/09/14 0925 04/11/14 1033   04/09/14 1000  vancomycin (VANCOCIN) 2,000 mg in sodium chloride 0.9 % 500 mL IVPB     2,000 mg 250 mL/hr over 120 Minutes Intravenous  Once 04/09/14 0925 04/09/14 1425   04/02/14 1200  vancomycin (VANCOCIN) 1,250 mg in sodium chloride 0.9 % 250 mL IVPB  Status:  Discontinued     1,250 mg 166.7 mL/hr over 90 Minutes Intravenous Every 12 hours 04/02/14 0005 04/04/14 1127   04/02/14 0015  vancomycin (VANCOCIN) 1,250 mg in sodium chloride 0.9 % 250 mL IVPB     1,250 mg 166.7 mL/hr over 90 Minutes Intravenous  Once 04/02/14 0005 04/02/14 0412   04-11-2014 0602  ceFAZolin (ANCEF) 2-3 GM-% IVPB SOLR    Comments:  Andrey Spearman   : cabinet override      04-11-2014 0602 11-Apr-2014 0602      Assessment: 59 yo m admitted for STEMI. Patient is on day 12 of vancomycin for MRSA pneumonia.  Stop date in computer for 2/22 for a total of 14 days. Patient is also on cefepime for continued fevers, added on 2/17.  Per CCM, will d/c cefepime in the AM if patient in stable condition. Wbc down to 11.4, afebrile, SCr 1.55>1.50 today.  Doses of antibiotics are appropriate. With fluctuating renal function, will get vancomycin trough for tonight.  Zosyn 2/8 >> 2/10 Vanc 2/1 >> 2/3, 2/8 >>  (2/22) Cefepime 2/17 >>  2/1 BCx: negative 2/8 BCx: negative 2/8 trach asp- MRSA 2/17 BCx: ngtd 2/16 cdiff neg  Goal of Therapy:  Vancomycin trough level 15-20 mcg/ml  Plan:  Continue vancomycin 1 gm IV q12h Continue cefepime 1gm IV q8h Monitor wbc, cultures, renal function, clinical course VT tonight @ 2130  Cassie L. Roseanne Reno, PharmD Clinical Pharmacy Resident Pager: [phone] 04/21/2014 2:11 PM

## 2014-04-21 NOTE — Progress Notes (Signed)
Patient ID: Micheal Ayala, male   DOB: 03/14/1955, 59 y.o.   MRN: [id number]  Called as huge leak, sats dropping Clearly cuff blown sats when I arrive 50% i placed ETT 7 easly, did not push when hit resistance of trach placed bouche through old trach, removed it Attempted to place new trach over, unable, the fistula is hours old and did not force as would have high chance to false track etc  Then placed bronch through new ETT, back it up to above trach site Removed bouche Proceed to new trach as leak so huge and sats remain low Even with compression, hence need trach to seal See additional noted  Ccm time now 90 min   Micheal Ayala [name], MD, FACP Pgr: [phone] [facility]'

## 2014-04-21 NOTE — Progress Notes (Addendum)
Per Elink MD okay to resume TFs.

## 2014-04-22 ENCOUNTER — Inpatient Hospital Stay (HOSPITAL_COMMUNITY): Payer: Medicare Other

## 2014-04-22 LAB — COMPREHENSIVE METABOLIC PANEL
ALK PHOS: 317 U/L — AB (ref 39–117)
ALT: 75 U/L — ABNORMAL HIGH (ref 0–53)
AST: 82 U/L — ABNORMAL HIGH (ref 0–37)
Albumin: 1.9 g/dL — ABNORMAL LOW (ref 3.5–5.2)
Anion gap: 6 (ref 5–15)
BILIRUBIN TOTAL: 1.4 mg/dL — AB (ref 0.3–1.2)
BUN: 100 mg/dL — ABNORMAL HIGH (ref 6–23)
CO2: 36 mmol/L — ABNORMAL HIGH (ref 19–32)
Calcium: 7.8 mg/dL — ABNORMAL LOW (ref 8.4–10.5)
Chloride: 101 mmol/L (ref 96–112)
Creatinine, Ser: 1.53 mg/dL — ABNORMAL HIGH (ref 0.50–1.35)
GFR calc Af Amer: 56 mL/min — ABNORMAL LOW (ref >90)
GFR calc non Af Amer: 48 mL/min — ABNORMAL LOW (ref >90)
Glucose, Bld: 168 mg/dL — ABNORMAL HIGH (ref 70–99)
Potassium: 3.4 mmol/L — ABNORMAL LOW (ref 3.5–5.1)
Sodium: 143 mmol/L (ref 135–145)
TOTAL PROTEIN: 5.9 g/dL — AB (ref 6.0–8.3)

## 2014-04-22 LAB — GLUCOSE, CAPILLARY
GLUCOSE-CAPILLARY: 135 mg/dL — AB (ref 70–99)
GLUCOSE-CAPILLARY: 139 mg/dL — AB (ref 70–99)
GLUCOSE-CAPILLARY: 157 mg/dL — AB (ref 70–99)
GLUCOSE-CAPILLARY: 162 mg/dL — AB (ref 70–99)
Glucose-Capillary: 172 mg/dL — ABNORMAL HIGH (ref 70–99)
Glucose-Capillary: 157 mg/dL — ABNORMAL HIGH (ref 70–99)

## 2014-04-22 LAB — CBC WITH DIFFERENTIAL/PLATELET
Basophils Absolute: 0 10*3/uL (ref 0.0–0.1)
Basophils Relative: 0 % (ref 0–1)
Eosinophils Absolute: 0.3 10*3/uL (ref 0.0–0.7)
Eosinophils Relative: 2 % (ref 0–5)
HCT: 30.6 % — ABNORMAL LOW (ref 39.0–52.0)
Hemoglobin: 8.8 g/dL — ABNORMAL LOW (ref 13.0–17.0)
Lymphocytes Relative: 9 % — ABNORMAL LOW (ref 12–46)
Lymphs Abs: 1.2 10*3/uL (ref 0.7–4.0)
MCH: 27.7 pg (ref 26.0–34.0)
MCHC: 28.8 g/dL — AB (ref 30.0–36.0)
MCV: 96.2 fL (ref 78.0–100.0)
MONOS PCT: 4 % (ref 3–12)
Monocytes Absolute: 0.5 10*3/uL (ref 0.1–1.0)
NEUTROS PCT: 85 % — AB (ref 43–77)
Neutro Abs: 11.7 10*3/uL — ABNORMAL HIGH (ref 1.7–7.7)
PLATELETS: 322 10*3/uL (ref 150–400)
RBC: 3.18 MIL/uL — ABNORMAL LOW (ref 4.22–5.81)
RDW: 19.7 % — ABNORMAL HIGH (ref 11.5–15.5)
WBC: 13.7 10*3/uL — AB (ref 4.0–10.5)

## 2014-04-22 LAB — CARBOXYHEMOGLOBIN
Carboxyhemoglobin: 2.2 % — ABNORMAL HIGH (ref 0.5–1.5)
Methemoglobin: 0.7 % (ref 0.0–1.5)
O2 SAT: 65.1 %
TOTAL HEMOGLOBIN: 8.3 g/dL — AB (ref 13.5–18.0)

## 2014-04-22 LAB — BLOOD GAS, ARTERIAL
Acid-Base Excess: 6.8 mmol/L — ABNORMAL HIGH (ref 0.0–2.0)
Bicarbonate: 31.3 mEq/L — ABNORMAL HIGH (ref 20.0–24.0)
Drawn by: 39899
FIO2: 0.6 %
LHR: 16 {breaths}/min
O2 Saturation: 88.3 %
PCO2 ART: 49.7 mmHg — AB (ref 35.0–45.0)
PEEP: 10 cmH2O
PH ART: 7.416 (ref 7.350–7.450)
PO2 ART: 58.4 mmHg — AB (ref 80.0–100.0)
Patient temperature: 98.6
TCO2: 32.9 mmol/L (ref 0–100)
VT: 520 mL

## 2014-04-22 LAB — PROCALCITONIN: PROCALCITONIN: 0.64 ng/mL

## 2014-04-22 LAB — VANCOMYCIN, RANDOM: Vancomycin Rm: 25.9 ug/mL

## 2014-04-22 LAB — PHOSPHORUS: PHOSPHORUS: 4.2 mg/dL (ref 2.3–4.6)

## 2014-04-22 LAB — MAGNESIUM: MAGNESIUM: 2.3 mg/dL (ref 1.5–2.5)

## 2014-04-22 MED ORDER — HEPARIN SODIUM (PORCINE) 5000 UNIT/ML IJ SOLN
5000.0000 [IU] | Freq: Three times a day (TID) | INTRAMUSCULAR | Status: DC
  Administered 2014-04-22 – 2014-05-02 (×30): 5000 [IU] via SUBCUTANEOUS
  Filled 2014-04-22 (×33): qty 1

## 2014-04-22 MED ORDER — SODIUM CHLORIDE 0.9 % IJ SOLN
10.0000 mL | INTRAMUSCULAR | Status: DC | PRN
  Administered 2014-04-22: 10 mL
  Filled 2014-04-22: qty 40

## 2014-04-22 MED ORDER — POTASSIUM CHLORIDE 20 MEQ/15ML (10%) PO SOLN
40.0000 meq | Freq: Once | ORAL | Status: AC
  Administered 2014-04-22: 40 meq
  Filled 2014-04-22: qty 30

## 2014-04-22 MED ORDER — SODIUM CHLORIDE 0.9 % IJ SOLN
10.0000 mL | Freq: Two times a day (BID) | INTRAMUSCULAR | Status: DC
  Administered 2014-04-22 (×2): 10 mL
  Administered 2014-04-23: 20 mL
  Administered 2014-04-24 – 2014-04-30 (×11): 10 mL

## 2014-04-22 MED ORDER — SODIUM CHLORIDE 0.9 % IV SOLN: INTRAVENOUS | Status: DC | PRN

## 2014-04-22 MED ORDER — ALTEPLASE 2 MG IJ SOLR
2.0000 mg | Freq: Once | INTRAMUSCULAR | Status: DC
  Filled 2014-04-22: qty 2

## 2014-04-22 NOTE — Progress Notes (Signed)
Patient ID: Micheal Ayala, male   DOB: 08/22/1955, 59 y.o.   MRN: [id number]  Advanced Heart Failure Rounding Note   Subjective:    Impella removed 2/5.  Echo (2/7) with EF 25-30%, peri-apical akinesis, no LV thrombus noted, normal RV.   Underwent trach 2/16 due to severe MRSA PNA and ARDS. Yesterday developed cuff leak and trach replaced.  Cuff appears to have developed recurrent leak. Trying to minimize pressures. Now of FIO2 60%. Sats in high 80s Requiring more levophed. Now at 30. Co-ox 66%  Remians NSR on IV amio  CXR reviewed - diffuse infiltrates and effusions. Unchanged.    Objective:   Weight Range:  Vital Signs:   Temp:  [98.2 F (36.8 C)-99.3 F (37.4 C)] 98.4 F (36.9 C) (02/21 0800) Pulse Rate:  [73-112] 83 (02/21 0800) Resp:  [0-37] 24 (02/21 0800) BP: (67-125)/(43-83) 109/68 mmHg (02/21 0800) SpO2:  [50 %-98 %] 91 % (02/21 0800) FiO2 (%):  [50 %-70 %] 60 % (02/21 0755) Weight:  [103.4 kg (227 lb 15.3 oz)] 103.4 kg (227 lb 15.3 oz) (02/21 0359) Last BM Date: 04/18/14  Weight change: Filed Weights   04/20/14 0459 04/21/14 0332 04/22/14 0359  Weight: 104.1 kg (229 lb 8 oz) 103.7 kg (228 lb 9.9 oz) 103.4 kg (227 lb 15.3 oz)    Intake/Output:   Intake/Output Summary (Last 24 hours) at 04/22/14 0915 Last data filed at 04/22/14 0600  Gross per 24 hour  Intake 4296.78 ml  Output   3850 ml  Net 446.78 ml     Physical Exam: General:  On trach. Diaphoretic and agitated HEENT: normal except for R eye enucleation  OGT Neck: supple.Carotids 2+ bilat; no bruits.   Cor: PMI laterally displaced. Regular  Lungs: diffuse rhonchi Abdomen: soft, nontender, + distended. No hepatosplenomegaly. No bruits or masses. Hypoactive bowel sounds. Extremities: no cyanosis, clubbing, rash, 1+ edema. Neuro: awake on vent. agitated  Telemetry: Sinus 80-100  Labs: Basic Metabolic Panel:  Recent Labs Lab 04/18/14 0335 04/19/14 0411 04/19/14 1933 04/20/14 0320  04/20/14 1910 04/21/14 0340 04/22/14 0400  NA 147* 143 143 144 144 144 143  K 2.4* 2.1* 2.7* 3.6 2.9* 3.8 3.4*  CL 105 98 101 102 98 103 101  CO2 36* 36* 34* 33* 32 32 36*  GLUCOSE 134* 139* 126* 118* 135* 151* 168*  BUN 84* 95* 103* 107* 104* 104* 100*  CREATININE 1.25 1.36* 1.41* 1.47* 1.55* 1.50* 1.53*  CALCIUM 7.7* 7.8* 8.0* 8.0* 8.1* 8.2* 7.8*  MG 2.3 2.2  --  2.4  --  2.4 2.3  PHOS 3.7 3.7  --  4.0  --  3.6 4.2    Liver Function Tests:  Recent Labs Lab 04/22/14 0400  AST 82*  ALT 75*  ALKPHOS 317*  BILITOT 1.4*  PROT 5.9*  ALBUMIN 1.9*   No results for input(s): LIPASE, AMYLASE in the last 168 hours. No results for input(s): AMMONIA in the last 168 hours.  CBC:  Recent Labs Lab 04/18/14 0335 04/19/14 0411 04/20/14 0320 04/21/14 0340 04/22/14 0400  WBC 14.3* 14.3* 14.0* 11.4* 13.7*  NEUTROABS  --   --   --   --  11.7*  HGB 9.3* 8.7* 9.1* 9.1* 8.8*  HCT 31.8* 29.1* 30.4* 30.5* 30.6*  MCV 95.5 93.6 93.8 94.4 96.2  PLT 356 324 317 311 322    Cardiac Enzymes: No results for input(s): CKTOTAL, CKMB, CKMBINDEX, TROPONINI in the last 168 hours.  BNP: BNP (last 3 results) No results  for input(s): PROBNP in the last 8760 hours.   Other results:    Imaging: Dg Chest Port 1 View  04/22/2014   CLINICAL DATA:  Pulmonary edema, MI  EXAM: PORTABLE CHEST - 1 VIEW  COMPARISON:  Portable exam 0612 hr compared to 04/21/2014  FINDINGS: Tracheostomy tube tip projects approximate 6.5 cm above carina.  Feeding tube extends into stomach.  LEFT subclavian central venous catheter with tip projecting over SVC.  Numerous EKG leads project over central chest.  Rotated to the LEFT.  Stable heart size mediastinal contours.  BILATERAL airspace infiltrates which could represent edema, pneumonia or ARDS, slightly improved.  Small the RIGHT pleural effusion.  No pneumothorax.  Bones unremarkable.  IMPRESSION: Improved airspace infiltrates which could represent edema, infection or  ARDS.   Electronically Signed   By: Ulyses Southward M.D.   On: 04/22/2014 07:30   Dg Chest Port 1 View  04/21/2014   CLINICAL DATA:  Tracheostomy complication  EXAM: PORTABLE CHEST - 1 VIEW  COMPARISON:  04/21/2014  FINDINGS: Cardiomediastinal silhouette is stable. Thoracostomy tube and NG tube is unchanged in position. Again noted bilateral diffuse airspace disease highly suspicious for pneumonia or ARDS. Superimposed pulmonary edema cannot be excluded. Clinical correlation is necessary. There is no pneumothorax. Stable left subclavian central line position.  IMPRESSION: Stable support apparatus. Again noted bilateral diffuse airspace disease highly suspicious for pneumonia or ARDS, superimposed pulmonary edema cannot be excluded. Clinical correlation is necessary. No pneumothorax.   Electronically Signed   By: Natasha Mead M.D.   On: 04/21/2014 12:37   Dg Chest Port 1 View  04/21/2014   CLINICAL DATA:  59 year old male with a history of endotracheal tube placement.  EXAM: PORTABLE CHEST - 1 VIEW  COMPARISON:  Multiple prior including 04/20/2014, 04/19/2014 04/18/2014  FINDINGS: Cardiomediastinal silhouette unchanged since the prior, partially obscured by overlying lung and pleural disease.  Right rotation of the patient accentuates the left mediastinal border and left hilar structures.  Unchanged position of the tracheostomy tube, terminating over the tracheal stripe.  Interval removal of the coaxial wire from enteric tube. Enteric tube projects over the mediastinum and terminates out of the field view with non visualization of the tip.  Unchanged position of left upper extremity PICC  Persistent right greater than left interstitial and airspace disease. Partial obscuration the right hemidiaphragm. Node visualized pneumothorax.  IMPRESSION: Similar appearance of right greater than left interstitial and airspace disease, potentially reflecting a combination of pneumonia and/or asymmetric edema, with differential  including evolving ARDS.  Unchanged position of tracheostomy tube.  Interval removal of coaxial wire from the enteric tube, which terminates out of the field of view. Unchanged position of left upper extremity PICC.  Signed,  Yvone Neu. Loreta Ave, DO  [facility]  [facility]   Electronically Signed   By: Gilmer Mor D.O.   On: 04/21/2014 07:12   Dg Chest Port 1 View  04/20/2014   CLINICAL DATA:  Endotracheal tube placement  EXAM: PORTABLE CHEST - 1 VIEW  COMPARISON:  04/20/2014  FINDINGS: Cardiomediastinal silhouette is stable. Endotracheal tube has been removed. Tracheostomy tube in place. Persistent pulmonary edema and small right pleural effusion. NG tube in place with tip within proximal stomach. No pneumothorax  IMPRESSION: Tracheostomy tube in place. No pneumothorax. Persistent pulmonary edema and small right pleural effusion. NG tube in place.   Electronically Signed   By: Natasha Mead M.D.   On: 04/20/2014 17:43   Dg Abd Portable 1v  04/20/2014   CLINICAL DATA:  Feeding tube placement, history MI  EXAM: PORTABLE ABDOMEN ONE VIEW:  COMPARISON:  Portable exam 1728 hours compared to 04/04/2014  FINDINGS: Tip of feeding tube projects over proximal to mid stomach.  Bowel gas pattern normal.  Osseous structures unremarkable.  No pathologic calcifications.  IMPRESSION: Tip of feeding tube projects over the proximal to mid stomach.   Electronically Signed   By: Ulyses Southward M.D.   On: 04/20/2014 17:43     Medications:     Scheduled Medications: . alteplase  2 mg Intracatheter Once  . antiseptic oral rinse  7 mL Mouth Rinse QID  . aspirin  81 mg Per Tube Daily  . atorvastatin  80 mg Per Tube q1800  . ceFEPime (MAXIPIME) IV  1 g Intravenous Q8H  . chlorhexidine  15 mL Mouth Rinse BID  . docusate  100 mg Oral BID  . feeding supplement (PRO-STAT SUGAR FREE 64)  60 mL Per Tube TID  . feeding supplement (VITAL HIGH PROTEIN)  1,000 mL Per Tube Q24H  .  furosemide  40 mg Intravenous Q8H  . heparin subcutaneous  5,000 Units Subcutaneous 3 times per day  . insulin aspart  0-15 Units Subcutaneous 6 times per day  . insulin glargine  35 Units Subcutaneous Daily  . ipratropium  0.5 mg Nebulization Q6H  . levalbuterol  0.63 mg Nebulization Q6H  . multivitamin  5 mL Per Tube Daily  . pantoprazole sodium  40 mg Per Tube Q24H  . potassium chloride  40 mEq Per Tube Once  . QUEtiapine  100 mg Oral BID  . sodium chloride  10-40 mL Intracatheter Q12H  . ticagrelor  90 mg Per Tube BID  . vancomycin  1,000 mg Intravenous Q12H  . vecuronium  10 mg Intravenous Once    Infusions: . amiodarone 30 mg/hr (04/22/14 0411)  . cisatracurium (NIMBEX) infusion Stopped (04/17/14 1115)  . fentaNYL infusion INTRAVENOUS 300 mcg/hr (04/22/14 0600)  . midazolam (VERSED) infusion 2 mg/hr (04/21/14 2000)  . norepinephrine (LEVOPHED) Adult infusion 28 mcg/min (04/22/14 0200)    PRN Medications: Place/Maintain arterial line **AND** sodium chloride, Place/Maintain arterial line **AND** sodium chloride, acetaminophen (TYLENOL) oral liquid 160 mg/5 mL, bisacodyl, fentaNYL, Influenza vac split quadrivalent PF, levalbuterol, ondansetron (ZOFRAN) IV, pneumococcal 23 valent vaccine, sennosides, sodium chloride, sorbitol   Assessment:   1. Cardiogenic shock    --s/p Impella placement 1/31, Impella out 2/5.  2. Acute on chronic systolic HF    --EF 10% on echo    --Repeat echo (2/7) with EF 25-30%, peri-apical akinesis, no LV thrombus, normal RV 3. Anterolateral STEMI 1/31   --due to stent occlusion   --s/p PCI with DES to LAD and OM-2 on 1/31   --s/p PCI with DES to RCA 2/5 4. VDRF 5. CAD 6. Tobacco abuse ongoing 7. 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 Hypomagnesemia/hyponatremia 9. Anemia s/p 3u RBCs 10. SVT: Amiodarone started 11. MRSA Pneumonia, hospital acquired 12. Acute kidney injury 13. Hypernatremia  Plan/Discussion:    Main issue  now is severe MRSA PNA and ARDS. S/p trach c/b air leak. Respiratory status worse. Requiring more pressors.   P/CCM managing ARDS and airway.   HF currently stable. Continue norepi for BP support. Continue lasix.  Continue ASA, statin, brilinta.  Continue IV amio for AF.   Prognosis appears much more tenuous   The patient is critically ill with multiple organ systems failure and requires high complexity  decision making for assessment and support, frequent evaluation and titration of therapies, application of advanced monitoring technologies and extensive interpretation of multiple databases.   Critical Care Time devoted to patient care services described in this note is 35 Minutes.   Length of Stay: 21 Arvilla Meres MD 04/22/2014, 9:15 AM  Advanced Heart Failure Team Pager [phone] (M-F; 7a - 4p)  Please contact [facility] for night-coverage after hours (4p -7a ) and weekends on [email or web address]

## 2014-04-22 NOTE — Progress Notes (Signed)
ANTIBIOTIC CONSULT NOTE - FOLLOW UP  Pharmacy Consult for vancomycin Indication: MRSA PNA  No Known Allergies  Patient Measurements: Height:  (180.3 cm) Weight: 227 lb 15.3 oz (103.4 kg) IBW/kg (Calculated) : 75.3 Adjusted Body Weight:   Vital Signs: Temp: 99.5 F (37.5 C) (02/21 1923) Temp Source: Oral (02/21 1923) BP: 80/47 mmHg (02/21 2200) Pulse Rate: 93 (02/21 2200) Intake/Output from previous day: 02/20 0701 - 02/21 0700 In: 4423.2 [I.V.:1863.2; NG/GT:2210; IV Piggyback:350] Out: 3850 [Urine:3850] Intake/Output from this shift: Total I/O In: 423.4 [I.V.:228.4; NG/GT:195] Out: 900 [Urine:900]  Labs:  Recent Labs  04/20/14 0320 04/20/14 1910 04/21/14 0340 04/22/14 0400  WBC 14.0*  --  11.4* 13.7*  HGB 9.1*  --  9.1* 8.8*  PLT 317  --  311 322  CREATININE 1.47* 1.55* 1.50* 1.53*   Estimated Creatinine Clearance: 64.4 mL/min (by C-G formula based on Cr of 1.53).  Recent Labs  04/21/14 2130 04/22/14 2120  VANCOTROUGH 43.9*  --   VANCORANDOM  --  25.9     Microbiology: Recent Results (from the past 720 hour(s))  MRSA PCR Screening     Status: None   Collection Time: 2014-04-22  8:01 AM  Result Value Ref Range Status   MRSA by PCR NEGATIVE NEGATIVE Final    Comment:        The GeneXpert MRSA Assay (FDA approved for NASAL specimens only), is one component of a comprehensive MRSA colonization surveillance program. It is not intended to diagnose MRSA infection nor to guide or monitor treatment for MRSA infections.   Culture, blood (routine x 2)     Status: None   Collection Time: 04/02/14  1:32 PM  Result Value Ref Range Status   Specimen Description BLOOD RIGHT HAND  Final   Special Requests BOTTLES DRAWN AEROBIC AND ANAEROBIC 10CC  Final   Culture   Final    NO GROWTH 5 DAYS Note: Culture results may be compromised due to an excessive volume of blood received in culture bottles. Performed at [facility]    Report Status  04/08/2014 FINAL  Final  Culture, blood (routine x 2)     Status: None   Collection Time: 04/02/14  2:12 PM  Result Value Ref Range Status   Specimen Description BLOOD RIGHT ARM  Final   Special Requests BOTTLES DRAWN AEROBIC AND ANAEROBIC 5CC  Final   Culture   Final    NO GROWTH 5 DAYS Performed at [facility]    Report Status 04/08/2014 FINAL  Final  Culture, respiratory (NON-Expectorated)     Status: None   Collection Time: 04/09/14  9:25 AM  Result Value Ref Range Status   Specimen Description TRACHEAL ASPIRATE  Final   Special Requests Normal  Final   Gram Stain   Final    MODERATE WBC PRESENT, PREDOMINANTLY PMN NO SQUAMOUS EPITHELIAL CELLS SEEN FEW GRAM POSITIVE COCCI IN PAIRS IN CLUSTERS Performed at [facility]    Culture   Final    ABUNDANT METHICILLIN RESISTANT STAPHYLOCOCCUS AUREUS Note: RIFAMPIN AND GENTAMICIN SHOULD NOT BE USED AS SINGLE DRUGS FOR TREATMENT OF STAPH INFECTIONS. CRITICAL RESULT CALLED TO, READ BACK BY AND VERIFIED WITH: ERIN S@7 :30AM ON 04/11/14 BY DANTS Performed at [facility]    Report Status 04/11/2014 FINAL  Final   Organism ID, Bacteria METHICILLIN RESISTANT STAPHYLOCOCCUS AUREUS  Final      Susceptibility   Methicillin resistant staphylococcus aureus - MIC*    CLINDAMYCIN >=8 RESISTANT Resistant  ERYTHROMYCIN >=8 RESISTANT Resistant     GENTAMICIN <=0.5 SENSITIVE Sensitive     LEVOFLOXACIN >=8 RESISTANT Resistant     OXACILLIN >=4 RESISTANT Resistant     PENICILLIN >=0.5 RESISTANT Resistant     RIFAMPIN <=0.5 SENSITIVE Sensitive     TRIMETH/SULFA <=10 SENSITIVE Sensitive     VANCOMYCIN <=0.5 SENSITIVE Sensitive     TETRACYCLINE <=1 SENSITIVE Sensitive     * ABUNDANT METHICILLIN RESISTANT STAPHYLOCOCCUS AUREUS  Culture, blood (routine x 2)     Status: None   Collection Time: 04/09/14 11:23 AM  Result Value Ref Range Status   Specimen Description BLOOD LEFT HAND  Final   Special Requests BOTTLES DRAWN  AEROBIC AND ANAEROBIC 10CC  Final   Culture   Final    NO GROWTH 5 DAYS Performed at [facility]    Report Status 04/15/2014 FINAL  Final  Culture, blood (routine x 2)     Status: None   Collection Time: 04/09/14 11:35 AM  Result Value Ref Range Status   Specimen Description BLOOD RIGHT ARM  Final   Special Requests BOTTLES DRAWN AEROBIC ONLY 6CC  Final   Culture   Final    NO GROWTH 5 DAYS Performed at [facility]    Report Status 04/15/2014 FINAL  Final  Clostridium Difficile by PCR     Status: None   Collection Time: 04/17/14  4:46 PM  Result Value Ref Range Status   C difficile by pcr NEGATIVE NEGATIVE Final  Culture, blood (routine x 2)     Status: None (Preliminary result)   Collection Time: 04/18/14  8:50 AM  Result Value Ref Range Status   Specimen Description BLOOD LEFT HAND  Final   Special Requests BOTTLES DRAWN AEROBIC AND ANAEROBIC 10CC  Final   Culture   Final           BLOOD CULTURE RECEIVED NO GROWTH TO DATE CULTURE WILL BE HELD FOR 5 DAYS BEFORE ISSUING A FINAL NEGATIVE REPORT Performed at [facility]    Report Status PENDING  Incomplete  Culture, blood (routine x 2)     Status: None (Preliminary result)   Collection Time: 04/18/14  8:57 AM  Result Value Ref Range Status   Specimen Description BLOOD RIGHT HAND  Final   Special Requests BOTTLES DRAWN AEROBIC AND ANAEROBIC 10CC  Final   Culture   Final           BLOOD CULTURE RECEIVED NO GROWTH TO DATE CULTURE WILL BE HELD FOR 5 DAYS BEFORE ISSUING A FINAL NEGATIVE REPORT Performed at [facility]    Report Status PENDING  Incomplete    Anti-infectives    Start     Dose/Rate Route Frequency Ordered Stop   04/18/14 0900  ceFEPIme (MAXIPIME) 1 g in dextrose 5 % 50 mL IVPB     1 g 100 mL/hr over 30 Minutes Intravenous Every 8 hours 04/18/14 0836     04/13/14 2200  vancomycin (VANCOCIN) IVPB 1000 mg/200 mL premix     1,000 mg 200 mL/hr over 60 Minutes Intravenous Every 12  hours 04/13/14 1316 04/23/14 2359   04/12/14 2200  vancomycin (VANCOCIN) IVPB 750 mg/150 ml premix  Status:  Discontinued     750 mg 150 mL/hr over 60 Minutes Intravenous Every 12 hours 04/12/14 0953 04/13/14 1316   04/09/14 2100  vancomycin (VANCOCIN) IVPB 1000 mg/200 mL premix  Status:  Discontinued     1,000 mg 200 mL/hr over 60 Minutes  Intravenous Every 12 hours 04/09/14 0925 04/12/14 0953   04/09/14 1100  piperacillin-tazobactam (ZOSYN) IVPB 3.375 g  Status:  Discontinued     3.375 g 12.5 mL/hr over 240 Minutes Intravenous 3 times per day 04/09/14 0925 04/11/14 1033   04/09/14 1000  vancomycin (VANCOCIN) 2,000 mg in sodium chloride 0.9 % 500 mL IVPB     2,000 mg 250 mL/hr over 120 Minutes Intravenous  Once 04/09/14 0925 04/09/14 1425   04/02/14 1200  vancomycin (VANCOCIN) 1,250 mg in sodium chloride 0.9 % 250 mL IVPB  Status:  Discontinued     1,250 mg 166.7 mL/hr over 90 Minutes Intravenous Every 12 hours 04/02/14 0005 04/04/14 1127   04/02/14 0015  vancomycin (VANCOCIN) 1,250 mg in sodium chloride 0.9 % 250 mL IVPB     1,250 mg 166.7 mL/hr over 90 Minutes Intravenous  Once 04/02/14 0005 04/02/14 0412   03/09/2014 0602  ceFAZolin (ANCEF) 2-3 GM-% IVPB SOLR    Comments:  Andrey SpearmanBaldwin, Veronica   : cabinet override      03/19/2014 0602 03/31/2014 0602      Assessment: 59 yo male with MRSA PNA is currently on supratherapeutic vancomycin. Vancomycin random level was 25.9.  Goal of Therapy:  Vancomycin trough level 15-20 mcg/ml  Plan:  - Hold vancomycin for now. - recheck vancomycin random level in am to reassess dosing  Grady Lucci, Tsz-Yin 04/22/2014,10:13 PM

## 2014-04-22 NOTE — Progress Notes (Signed)
PULMONARY / CRITICAL CARE MEDICINE   Name: Micheal Ayala MRN: [id number] DOB: 11/11/1955    ADMISSION DATE:  17-Sep-2014 CONSULTATION DATE:  August 15, 2014  REFERRING MD :  Dr. Herbie Ayala  CHIEF COMPLAINT:  STEMI  INITIAL PRESENTATION:  59 yo smoker with STEMI, cardiogenic shock, VDRF.  He is visiting [place] from [place].  STUDIES:  1/31 Cardiac cath >> severe CAD with occlusion of LAD, circumflex, RCS, ischemic CM >> DES to LAD, circumflex 1/31 Echo >> LVEF 20-25%, severely reduced systolic function 2/04 LHC > DES RCA 2/06 Echo >> EF 25 to 30%, mod LVH, grade 2 diastolic dysfx  SIGNIFICANT EVENTS: 1/31 admit, cardiac cath, impella 2/01 TCTS consulted, transfuse 1 unit PRBC 2/05 impella removed; ?upper GI bleeding  2/08 increased respiratory secretions 2/10 ARDS protocol, added levophed 2/11 start paralytic 2/12 - tried off nimbex, liberated to 7cc/kg  2/19- trach placed 2/20- trach huge cuff leak, desaturation, emergent ETT then retrach 2/21- pressors increased  SUBJECTIVE:  Levo at 30, RN reports some leak again noted  VITAL SIGNS: Temp:  [97 F (36.1 C)-99.3 F (37.4 C)] 98.5 F (36.9 C) (02/21 0400) Pulse Rate:  [65-112] 82 (02/21 0734) Resp:  [0-37] 22 (02/21 0734) BP: (67-125)/(43-83) 97/59 mmHg (02/21 0734) SpO2:  [50 %-98 %] 96 % (02/21 0730) FiO2 (%):  [40 %-70 %] 60 % (02/21 0734) Weight:  [103.4 kg (227 lb 15.3 oz)] 103.4 kg (227 lb 15.3 oz) (02/21 0359) HEMODYNAMICS: CVP:  [15 mmHg-16 mmHg] 16 mmHg VENTILATOR SETTINGS: Vent Mode:  [-] PRVC FiO2 (%):  [40 %-70 %] 60 % Set Rate:  [16 bmp-24 bmp] 16 bmp Vt Set:  [520 mL] 520 mL PEEP:  [10 cmH20-12 cmH20] 12 cmH20 Plateau Pressure:  [25 cmH20-32 cmH20] 31 cmH20 INTAKE / OUTPUT:  Intake/Output Summary (Last 24 hours) at 04/22/14 0746 Last data filed at 04/22/14 0600  Gross per 24 hour  Intake 4423.18 ml  Output   3850 ml  Net 573.18 ml   PHYSICAL EXAMINATION:  Gen: trach placed, less blood then  prior HEENT: Rt eye enucleated PULM: coarse unchanged CV: irregular s1 s 2RR AB: decreased bowel sounds, soft, no r/g Ext: 2+ BLE edema  Neuro: RASS -3, no longer paralyzed  LABS:  CBC  Recent Labs Lab 04/20/14 0320 04/21/14 0340 04/22/14 0400  WBC 14.0* 11.4* 13.7*  HGB 9.1* 9.1* 8.8*  HCT 30.4* 30.5* 30.6*  PLT 317 311 322   Coag's  Recent Labs Lab 04/19/14 1530  APTT 23*  INR 1.20   BMET  Recent Labs Lab 04/20/14 1910 04/21/14 0340 04/22/14 0400  NA 144 144 143  K 2.9* 3.8 3.4*  CL 98 103 101  CO2 32 32 36*  BUN 104* 104* 100*  CREATININE 1.55* 1.50* 1.53*  GLUCOSE 135* 151* 168*   Electrolytes  Recent Labs Lab 04/20/14 0320 04/20/14 1910 04/21/14 0340 04/22/14 0400  CALCIUM 8.0* 8.1* 8.2* 7.8*  MG 2.4  --  2.4 2.3  PHOS 4.0  --  3.6 4.2   ABG  Recent Labs Lab 04/20/14 0316 04/21/14 0323 04/21/14 1415  PHART 7.479* 7.497* 7.414  PCO2ART 42.5 39.2 47.0*  PO2ART 82.8 113.0* 70.0*   Glucose  Recent Labs Lab 04/21/14 0736 04/21/14 1303 04/21/14 1641 04/21/14 1947 04/21/14 2328 04/22/14 0356  GLUCAP 107* 154* 145* 207* 143* 162*   PCXR 2/21 - slight improved it edema, trach wnl, rt effusion  ASSESSMENT / PLAN:  PULMONARY ETT 1/31>>>2/19 Trach Micheal Ayala(JY) 2/19>>>re do 2/20>>> A:  Acute respiratory  failure due to cardiogenic shock and pulmonary edema.   MRSA HCAP 2/08 with severe ARDS. Tobacco abuse. 2/20 emergent re-trach from leak P:   - Cont ARDS protocol, keep plat less 30  - with leak concerns would like to lower peep, goal 10, with 70% if needed -some leak this am noted, easily fixed -I think he needs an extra long 6 placed, again would NOT do within 36 hrs of trach, unless emergently declines again -neg balance as able -ABG in 1 hr to ensure with small leak, MV adaquate  CARDIOVASCULAR L Impella 1/31>>>2/5 Micheal Ayala CVL 2/08 >> A:  Cardiogenic shock post STEMI on admission - echo 2/7 EF 25-30%.  STEMI r/t stent occlusion,  now s/p DES to LAD and RCA Septic shock developed from MRSA PNA 2/10. SVT, A fib with RVR. Cvp=13 2/14 P: - Amiodarone, brilinta per cardiology - Continue ASA, lipitor, digoxin per cardiology - Titrate levophed for MAP to 60, with EF 35%, consider LOWER MAP goals -levo increasing, consider a line  RENAL A:  AKI in setting of cardiogenic/septic shock. Hypokalemia. Hypernatremia resolved P:   - Monitor BMET and UOP. - dc free water - Lasix 40 per cards -k supp  GASTROINTESTINAL A:   Nutrition. Constipation. ?upper GI bleed 2/05 >> no further episodes since. P:   - Protonix BID. - Bowel regimen, no BM x 3 days, prn med to be given - TF on going -would NOT peg at this time  HEMATOLOGIC A:  Thrombocytopenia >> likely from impella >> resolved. Anemia of critical illness. P:  - F/u CBC. - SQ heparin for DVT prevention, restart, trach clean  INFECTIOUS A: MRSA HCAP. Febrile overnight. P:    - Vanc 2/8>>> - Cefepime 2/17>>>2/21  - Blood 2/08 >>ng - Sputum 2/08 >> MRSA - Blood 2/17>>>  Consider dc cefepime in am, follow pressor needs prior to dc Assess pct as well- unimpressive  ENDOCRINE A:   Hyperglycemia. P:   - SSI with lantus -goal NICE 140-180  NEUROLOGIC A:  Acute encephalopathy 2nd to respiratory failure, cardiogenic shock. P:   - RASS goal to -2 today -goal to reduce drips versed by afternoon -to goal fent 200 -add fent patch -would NOT use methadone to reduce fent drip  Ccm time 30 min   Micheal Ayala. Micheal Alias, MD, FACP Pgr: [phone] [facility]

## 2014-04-22 NOTE — Progress Notes (Signed)
Pt was on 150 fentanyl and 1 versed ( half dose for wake up assessment ) and had been tolerating since 830am. Pt became anxious, breathing over vent,sats 70's. Increased sedation and analgesia. Increased vent to 100%, sats now low 80's. MD aware of cuff leak, continue to monitor. No secretions when suction.   Micheal Ayala

## 2014-04-22 NOTE — Procedures (Signed)
Arterial Catheter Insertion Procedure Note Micheal Ayala [id number] 10/12/1955  Procedure: Insertion of Arterial Catheter  Indications: Blood pressure monitoring and Frequent blood sampling  Procedure Details Consent: Unable to obtain consent because of patient being on ventilator. Time Out: Verified patient identification, verified procedure, site/side was marked, verified correct patient position, special equipment/implants available, medications/allergies/relevent history reviewed, required imaging and test results available.  Performed  Maximum sterile technique was used including antiseptics, cap, gloves, gown, hand hygiene, mask and sheet. Skin prep: Chlorhexidine; local anesthetic administered 20 gauge catheter was inserted into right radial artery using the Seldinger technique.  Evaluation Blood flow good; BP tracing good. Complications: No apparent complications.   Durwin GlazeBrown, Bastian Andreoli N 04/22/2014

## 2014-04-22 NOTE — Progress Notes (Signed)
Upon entering patient room, checked cuff pressure. Cuff pressure was noted to be 18 and tidal volumes were decreased, added air to cuff pressure of 26.  Patient immediately lost air and tidal volumes began to drop again.  Added air again to cuff pressure of 50 and immediately dropped to 20.  Added air and clamped pilot balloon with hemostats to maintain tidal volumes.  RN notified MD.  Will continue to monitor.

## 2014-04-23 ENCOUNTER — Inpatient Hospital Stay (HOSPITAL_COMMUNITY): Payer: Medicare Other

## 2014-04-23 DIAGNOSIS — A419 Sepsis, unspecified organism: Secondary | ICD-10-CM | POA: Diagnosis not present

## 2014-04-23 DIAGNOSIS — R6521 Severe sepsis with septic shock: Secondary | ICD-10-CM

## 2014-04-23 DIAGNOSIS — J15212 Pneumonia due to Methicillin resistant Staphylococcus aureus: Secondary | ICD-10-CM | POA: Diagnosis not present

## 2014-04-23 LAB — GLUCOSE, CAPILLARY
GLUCOSE-CAPILLARY: 123 mg/dL — AB (ref 70–99)
GLUCOSE-CAPILLARY: 146 mg/dL — AB (ref 70–99)
GLUCOSE-CAPILLARY: 150 mg/dL — AB (ref 70–99)
GLUCOSE-CAPILLARY: 133 mg/dL — AB (ref 70–99)
GLUCOSE-CAPILLARY: 134 mg/dL — AB (ref 70–99)
GLUCOSE-CAPILLARY: 133 mg/dL — AB (ref 70–99)
GLUCOSE-CAPILLARY: 130 mg/dL — AB (ref 70–99)
Glucose-Capillary: 107 mg/dL — ABNORMAL HIGH (ref 70–99)
Glucose-Capillary: 114 mg/dL — ABNORMAL HIGH (ref 70–99)
Glucose-Capillary: 119 mg/dL — ABNORMAL HIGH (ref 70–99)
Glucose-Capillary: 130 mg/dL — ABNORMAL HIGH (ref 70–99)
Glucose-Capillary: 130 mg/dL — ABNORMAL HIGH (ref 70–99)
Glucose-Capillary: 143 mg/dL — ABNORMAL HIGH (ref 70–99)
Glucose-Capillary: 151 mg/dL — ABNORMAL HIGH (ref 70–99)
Glucose-Capillary: 151 mg/dL — ABNORMAL HIGH (ref 70–99)
Glucose-Capillary: 161 mg/dL — ABNORMAL HIGH (ref 70–99)
Glucose-Capillary: 164 mg/dL — ABNORMAL HIGH (ref 70–99)
Glucose-Capillary: 167 mg/dL — ABNORMAL HIGH (ref 70–99)
Glucose-Capillary: 171 mg/dL — ABNORMAL HIGH (ref 70–99)
Glucose-Capillary: 182 mg/dL — ABNORMAL HIGH (ref 70–99)
Glucose-Capillary: 90 mg/dL (ref 70–99)
Glucose-Capillary: 103 mg/dL — ABNORMAL HIGH (ref 70–99)

## 2014-04-23 LAB — BASIC METABOLIC PANEL
Anion gap: 12 (ref 5–15)
BUN: 94 mg/dL — AB (ref 6–23)
CO2: 32 mmol/L (ref 19–32)
Calcium: 8 mg/dL — ABNORMAL LOW (ref 8.4–10.5)
Chloride: 101 mmol/L (ref 96–112)
Creatinine, Ser: 1.35 mg/dL (ref 0.50–1.35)
GFR calc Af Amer: 65 mL/min — ABNORMAL LOW (ref >90)
GFR calc non Af Amer: 56 mL/min — ABNORMAL LOW (ref >90)
Glucose, Bld: 141 mg/dL — ABNORMAL HIGH (ref 70–99)
POTASSIUM: 3.2 mmol/L — AB (ref 3.5–5.1)
SODIUM: 145 mmol/L (ref 135–145)

## 2014-04-23 LAB — BLOOD GAS, ARTERIAL
ACID-BASE EXCESS: 8.9 mmol/L — AB (ref 0.0–2.0)
Bicarbonate: 32.9 mEq/L — ABNORMAL HIGH (ref 20.0–24.0)
DRAWN BY: 252031
FIO2: 60 %
LHR: 16 {breaths}/min
MECHVT: 520 mL
O2 Saturation: 98.5 %
PATIENT TEMPERATURE: 98.6
PCO2 ART: 45.4 mmHg — AB (ref 35.0–45.0)
PEEP: 12 cmH2O
PH ART: 7.474 — AB (ref 7.350–7.450)
TCO2: 34.3 mmol/L (ref 0–100)
pO2, Arterial: 90.5 mmHg (ref 80.0–100.0)

## 2014-04-23 LAB — PROCALCITONIN: Procalcitonin: 0.49 ng/mL

## 2014-04-23 LAB — VANCOMYCIN, RANDOM: Vancomycin Rm: 17.5 ug/mL

## 2014-04-23 LAB — CBC WITH DIFFERENTIAL/PLATELET
BASOS ABS: 0 10*3/uL (ref 0.0–0.1)
BASOS PCT: 0 % (ref 0–1)
Eosinophils Absolute: 0.1 10*3/uL (ref 0.0–0.7)
Eosinophils Relative: 0 % (ref 0–5)
HEMATOCRIT: 28 % — AB (ref 39.0–52.0)
HEMOGLOBIN: 8.2 g/dL — AB (ref 13.0–17.0)
LYMPHS PCT: 6 % — AB (ref 12–46)
Lymphs Abs: 0.6 10*3/uL — ABNORMAL LOW (ref 0.7–4.0)
MCH: 28.2 pg (ref 26.0–34.0)
MCHC: 29.3 g/dL — ABNORMAL LOW (ref 30.0–36.0)
MCV: 96.2 fL (ref 78.0–100.0)
MONO ABS: 0.5 10*3/uL (ref 0.1–1.0)
Monocytes Relative: 5 % (ref 3–12)
Neutro Abs: 10 10*3/uL — ABNORMAL HIGH (ref 1.7–7.7)
Neutrophils Relative %: 89 % — ABNORMAL HIGH (ref 43–77)
PLATELETS: 267 10*3/uL (ref 150–400)
RBC: 2.91 MIL/uL — AB (ref 4.22–5.81)
RDW: 19.7 % — ABNORMAL HIGH (ref 11.5–15.5)
WBC: 11.2 10*3/uL — ABNORMAL HIGH (ref 4.0–10.5)

## 2014-04-23 LAB — MAGNESIUM: Magnesium: 2.3 mg/dL (ref 1.5–2.5)

## 2014-04-23 LAB — PHOSPHORUS: PHOSPHORUS: 3.4 mg/dL (ref 2.3–4.6)

## 2014-04-23 MED ORDER — VANCOMYCIN HCL IN DEXTROSE 1-5 GM/200ML-% IV SOLN
1000.0000 mg | Freq: Once | INTRAVENOUS | Status: AC
  Administered 2014-04-23: 1000 mg via INTRAVENOUS
  Filled 2014-04-23: qty 200

## 2014-04-23 MED ORDER — QUETIAPINE FUMARATE 50 MG PO TABS
50.0000 mg | ORAL_TABLET | Freq: Two times a day (BID) | ORAL | Status: DC
  Filled 2014-04-23 (×4): qty 1

## 2014-04-23 MED ORDER — TRAMADOL HCL 50 MG PO TABS: 50.0000 mg | ORAL_TABLET | Freq: Four times a day (QID) | ORAL | Status: DC | PRN

## 2014-04-23 MED ORDER — POTASSIUM CHLORIDE 20 MEQ/15ML (10%) PO SOLN
ORAL | Status: AC
  Filled 2014-04-23: qty 30

## 2014-04-23 MED ORDER — POTASSIUM CHLORIDE 20 MEQ/15ML (10%) PO SOLN
40.0000 meq | ORAL | Status: AC
  Administered 2014-04-23 (×2): 40 meq
  Filled 2014-04-23: qty 30

## 2014-04-23 MED ORDER — LIDOCAINE 5 % EX PTCH
2.0000 | MEDICATED_PATCH | CUTANEOUS | Status: DC
  Filled 2014-04-23: qty 2

## 2014-04-23 NOTE — Progress Notes (Signed)
Wasted 20 mL of Versed in the sink with Deretha EmoryMildred Shaw RN

## 2014-04-23 NOTE — Progress Notes (Signed)
PULMONARY / CRITICAL CARE MEDICINE   Name: Micheal Ayala MRN: [id number] DOB: [birth year]    ADMISSION DATE:  03/15/2014 CONSULTATION DATE:  03/19/2014  REFERRING MD :  Dr. Herbie Baltimore  CHIEF COMPLAINT:  STEMI  INITIAL PRESENTATION:  59 yo smoker with STEMI, cardiogenic shock, VDRF.  He is visiting [place] from [place].  STUDIES:  1/31 Cardiac cath >> severe CAD with occlusion of LAD, circumflex, RCS, ischemic CM >> DES to LAD, circumflex 1/31 Echo >> LVEF 20-25%, severely reduced systolic function 2/04 LHC > DES RCA 2/06 Echo >> EF 25 to 30%, mod LVH, grade 2 diastolic dysfx  SIGNIFICANT EVENTS: 1/31 admit, cardiac cath, impella 2/01 TCTS consulted, transfuse 1 unit PRBC 2/05 impella removed; ?upper GI bleeding  2/08 increased respiratory secretions 2/10 ARDS protocol, added levophed 2/11 start paralytic 2/12 - tried off nimbex, liberated to 7cc/kg  2/19- trach placed 2/20- trach huge cuff leak, desaturation, emergent ETT then retrach 2/21- pressors increased  SUBJECTIVE:  Note trach replaced 2/21, still w some cuff leak Norepi weaned to 5 last 24 hour PEEP 12, Fio2 60 Not following commands for RN this am   VITAL SIGNS: Temp:  [99.2 F (37.3 C)-99.9 F (37.7 C)] 99.2 F (37.3 C) (02/22 0328) Pulse Rate:  [64-109] 99 (02/22 0800) Resp:  [16-36] 30 (02/22 0800) BP: (80-122)/(46-73) 97/65 mmHg (02/22 0800) SpO2:  [84 %-98 %] 96 % (02/22 0800) Arterial Line BP: (89-128)/(43-74) 106/49 mmHg (02/22 0800) FiO2 (%):  [0.6 %-100 %] 60 % (02/22 0800) Weight:  [102.1 kg (225 lb 1.4 oz)] 102.1 kg (225 lb 1.4 oz) (02/22 0328) HEMODYNAMICS:   VENTILATOR SETTINGS: Vent Mode:  [-] PRVC FiO2 (%):  [0.6 %-100 %] 60 % Set Rate:  [16 bmp] 16 bmp Vt Set:  [520 mL] 520 mL PEEP:  [10 cmH20-12 cmH20] 12 cmH20 Plateau Pressure:  [26 cmH20-33 cmH20] 30 cmH20 INTAKE / OUTPUT:  Intake/Output Summary (Last 24 hours) at 04/23/14 0850 Last data filed at 04/23/14 0800  Gross per 24 hour   Intake 3311.03 ml  Output   4100 ml  Net -788.97 ml   PHYSICAL EXAMINATION:  Gen: sedated, comfortable HEENT: Rt eye enucleated PULM: B coarse BS, B crackles CV: regular, 100's, no M AB: decreased bowel sounds, soft, no r/g Ext: 2+ BLE edema  Neuro: Moves with stim, pain. Does not follow commands am 2/22  LABS:  CBC  Recent Labs Lab 04/21/14 0340 04/22/14 0400 04/23/14 0400  WBC 11.4* 13.7* 11.2*  HGB 9.1* 8.8* 8.2*  HCT 30.5* 30.6* 28.0*  PLT 311 322 267   Coag's  Recent Labs Lab 04/19/14 1530  APTT 23*  INR 1.20   BMET  Recent Labs Lab 04/21/14 0340 04/22/14 0400 04/23/14 0400  NA 144 143 145  K 3.8 3.4* 3.2*  CL 103 101 101  CO2 32 36* 32  BUN 104* 100* 94*  CREATININE 1.50* 1.53* 1.35  GLUCOSE 151* 168* 141*   Electrolytes  Recent Labs Lab 04/21/14 0340 04/22/14 0400 04/23/14 0400  CALCIUM 8.2* 7.8* 8.0*  MG 2.4 2.3 2.3  PHOS 3.6 4.2 3.4   ABG  Recent Labs Lab 04/21/14 1415 04/22/14 0825 04/23/14 0354  PHART 7.414 7.416 7.474*  PCO2ART 47.0* 49.7* 45.4*  PO2ART 70.0* 58.4* 90.5   Glucose  Recent Labs Lab 04/22/14 1213 04/22/14 1621 04/22/14 1929 04/22/14 2335 04/23/14 0344 04/23/14 0739  GLUCAP 172* 135* 157* 139* 133* 130*   PCXR 2/22 > no change B alveolar infiltrates, trach well positioned  ASSESSMENT / PLAN:  PULMONARY ETT 1/31>>>2/19 Trach [name] 2/19>>>re do 2/20>>> A:  Acute respiratory failure due to cardiogenic shock and pulmonary edema.   Superimposed MRSA HCAP +  severe ARDS. Tobacco abuse. 2/20 emergent re-trach from cuff leak  P:   - Cont ARDS protocol, keep P plat less 30  - with leak concerns would like to lower peep, goal 10. Consider increase FiO2 to 70 to allow this - will need an extra long 6 placed, probably end of week  - neg balance as able, + 8L for hospitalization as of 2/22  CARDIOVASCULAR L Impella 1/31>>>2/5 Lt [place] CVL 2/08 >> A:  Cardiogenic shock post STEMI on admission - echo  2/7 EF 25-30%.  STEMI r/t stent occlusion, now s/p DES to LAD and RCA Septic shock developed from MRSA PNA 2/10. SVT, A fib with RVR. P: - Amiodarone, brilinta per cardiology - Continue ASA, lipitor. Digoxin stopped - continue to wean norepi, currently for goal MAP > 60. Could consider making a lower goal depending on our ability to wean   RENAL A:  AKI in setting of cardiogenic/septic shock. Hypokalemia. Hypernatremia resolved P:   - Monitor BMET and UOP. - Lasix 40 q8h  - replete K  GASTROINTESTINAL A:   Nutrition. Constipation. ?upper GI bleed 2/05 >> no further episodes since. P:   - Protonix > changed to qd 2/8 - Bowel regimen order, no BM reported since 2/16 - TF on going  HEMATOLOGIC A:  Thrombocytopenia >> likely from impella >> resolved. Anemia of critical illness. P:  - F/u CBC. - SQ heparin for DVT prevention, no evidence bleeding  INFECTIOUS A: MRSA HCAP. Febrile overnight. P:    - Vanc 2/8>>>  - Cefepime 2/17>>>2/22  - Blood 2/08 >>ng - Sputum 2/08 >> MRSA - Blood 2/17>>>  D/c cefepime 2/22 and follow clinically, follow ability to wean norepi Plan d/c vanco 2/23 if he remains stable; he has been on for 15 days.   ENDOCRINE A:   Hyperglycemia. P:   - SSI with lantus - goal CBG 140-180  NEUROLOGIC A:  Acute encephalopathy 2nd to respiratory failure, cardiogenic shock. P:   - RASS goal to -1 on 2/22 - fentanyl gtt, goal to 50-75 - versed gtt - decrease seroquel to 50 bid  Independent CC time 35 minutes   Levy Pupaobert Mike Hamre, MD, PhD 04/23/2014, 9:13 AM [facility] [phone] or if no answer [phone]

## 2014-04-23 NOTE — Progress Notes (Signed)
ANTIBIOTIC CONSULT NOTE - FOLLOW UP  Pharmacy Consult for vancomycin Indication: MRSA PNA  No Known Allergies  Patient Measurements: Height: 5\' 11"  (180.3 cm) Weight: 225 lb 1.4 oz (102.1 kg) IBW/kg (Calculated) : 75.3 Adjusted Body Weight:   Vital Signs: Temp: 99.2 F (37.3 C) (02/22 0328) Temp Source: Oral (02/22 0328) BP: 89/54 mmHg (02/22 1000) Pulse Rate: 90 (02/22 1000) Intake/Output from previous day: 02/21 0701 - 02/22 0700 In: 3306.1 [I.V.:1796.1; NG/GT:1360; IV Piggyback:150] Out: 3500 [Urine:3500] Intake/Output from this shift: Total I/O In: 230.3 [I.V.:110.3; NG/GT:70; IV Piggyback:50] Out: 650 [Urine:650]  Labs:  Recent Labs  04/21/14 0340 04/22/14 0400 04/23/14 0400  WBC 11.4* 13.7* 11.2*  HGB 9.1* 8.8* 8.2*  PLT 311 322 267  CREATININE 1.50* 1.53* 1.35   Estimated Creatinine Clearance: 72.6 mL/min (by C-G formula based on Cr of 1.35).  Recent Labs  04/21/14 2130 04/22/14 2120 04/23/14 0800  VANCOTROUGH 43.9*  --   --   VANCORANDOM  --  25.9 17.5     Assessment: 59 yo male with MRSA PNA who developed AKI and was supratherapeutic on vancomycin 1g q 12 hrs.  Vancomycin on hold since 2/20, level now back in therapeutic range.  Spoke with Dr. Delton CoombesByrum, planning to d/c vancomycin tomorrow.  Goal of Therapy:  Vancomycin trough level 15-20 mcg/ml  Plan:  - Vancomycin 1g IV x 1 more dose, then d/c vancomycin.  Tad MooreJessica Mykell Rawl, Pharm D, BCPS  Clinical Pharmacist Pager [phone]  04/23/2014 10:48 AM

## 2014-04-23 NOTE — Progress Notes (Signed)
eLink Physician-Brief Progress Note Patient Name: Micheal FoundsFrederick Ayala DOB: 09/26/1955 MRN: [id number]   Date of Service  04/23/2014  HPI/Events of Note  Hypokalemia  eICU Interventions  Potassium replaced     Intervention Category Intermediate Interventions: Electrolyte abnormality - evaluation and management  DETERDING,ELIZABETH 04/23/2014, 5:52 AM

## 2014-04-23 NOTE — Progress Notes (Signed)
Patient ID: Micheal Ayala, male   DOB: January 05, 1956, 59 y.o.   MRN: [id number]  Advanced Heart Failure Rounding Note   Subjective:    Impella removed 2/5.  Echo (2/7) with EF 25-30%, peri-apical akinesis, no LV thrombus noted, normal RV.   Underwent trach 2/16 due to severe MRSA PNA and ARDS.   Somewhat improved this am.  Now on FIO2 60%. Levophed down to 5. Weaning sedation but still remains with periods of agitation. Continues on lasix. Weight down. Creatinine stable.   Remians NSR on IV amio    Objective:   Weight Range:  Vital Signs:   Temp:  [99.2 F (37.3 C)-99.9 F (37.7 C)] 99.2 F (37.3 C) (02/22 0328) Pulse Rate:  [64-109] 93 (02/22 0700) Resp:  [16-36] 26 (02/22 0700) BP: (80-122)/(46-73) 84/56 mmHg (02/22 0700) SpO2:  [84 %-98 %] 96 % (02/22 0743) Arterial Line BP: (89-128)/(43-74) 105/49 mmHg (02/22 0700) FiO2 (%):  [0.6 %-100 %] 0.6 % (02/22 0753) Weight:  [102.1 kg (225 lb 1.4 oz)] 102.1 kg (225 lb 1.4 oz) (02/22 0328) Last BM Date: 04/18/14  Weight change: Filed Weights   04/21/14 0332 04/22/14 0359 04/23/14 0328  Weight: 103.7 kg (228 lb 9.9 oz) 103.4 kg (227 lb 15.3 oz) 102.1 kg (225 lb 1.4 oz)    Intake/Output:   Intake/Output Summary (Last 24 hours) at 04/23/14 0800 Last data filed at 04/23/14 0700  Gross per 24 hour  Intake 3218.13 ml  Output   3500 ml  Net -281.87 ml     Physical Exam: General:  On trach.  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   Cor: PMI laterally displaced. Regular  Lungs: diffuse rhonchi Abdomen: soft, nontender, + mildly distended. No hepatosplenomegaly. No bruits or masses. Hypoactive bowel sounds. Extremities: no cyanosis, clubbing, rash, 1+ edema. Neuro: awake on vent. agitated  Telemetry: Sinus 90-100  Labs: Basic Metabolic Panel:  Recent Labs Lab 04/19/14 0411  04/20/14 0320 04/20/14 1910 04/21/14 0340 04/22/14 0400 04/23/14 0400  NA 143  < > 144  144 144 143 145  K 2.1*  < > 3.6 2.9* 3.8 3.4* 3.2*  CL 98  < > 102 98 103 101 101  CO2 36*  < > 33* 32 32 36* 32  GLUCOSE 139*  < > 118* 135* 151* 168* 141*  BUN 95*  < > 107* 104* 104* 100* 94*  CREATININE 1.36*  < > 1.47* 1.55* 1.50* 1.53* 1.35  CALCIUM 7.8*  < > 8.0* 8.1* 8.2* 7.8* 8.0*  MG 2.2  --  2.4  --  2.4 2.3 2.3  PHOS 3.7  --  4.0  --  3.6 4.2 3.4  < > = values in this interval not displayed.  Liver Function Tests:  Recent Labs Lab 04/22/14 0400  AST 82*  ALT 75*  ALKPHOS 317*  BILITOT 1.4*  PROT 5.9*  ALBUMIN 1.9*   No results for input(s): LIPASE, AMYLASE in the last 168 hours. No results for input(s): AMMONIA in the last 168 hours.  CBC:  Recent Labs Lab 04/19/14 0411 04/20/14 0320 04/21/14 0340 04/22/14 0400 04/23/14 0400  WBC 14.3* 14.0* 11.4* 13.7* 11.2*  NEUTROABS  --   --   --  11.7* 10.0*  HGB 8.7* 9.1* 9.1* 8.8* 8.2*  HCT 29.1* 30.4* 30.5* 30.6* 28.0*  MCV 93.6 93.8 94.4 96.2 96.2  PLT 324 317 311 322 267    Cardiac Enzymes: No results for input(s): CKTOTAL,  CKMB, CKMBINDEX, TROPONINI in the last 168 hours.  BNP: BNP (last 3 results) No results for input(s): PROBNP in the last 8760 hours.   Other results:    Imaging: Dg Chest Port 1 View  04/23/2014   CLINICAL DATA:  Shortness of breath, myocardial infarction, history of tobacco use.  EXAM: PORTABLE CHEST - 1 VIEW  COMPARISON:  Portable chest x-ray of February 20th and April 22, 2014 and April 16, 2014.  FINDINGS: The lungs are well-expanded. The bilateral confluent alveolar opacities are more conspicuous today. A small right pleural effusion persists. The cardiac silhouette is enlarged. The pulmonary vascularity is engorged and indistinct. The tracheostomy appliance tube tip projects 5.2 cm above the carina. The esophagogastric tube tip projects below the inferior margin of the image. The left subclavian catheter tip projects over the midportion of the SVC.  IMPRESSION: There is  mild interval worsening in the bilateral alveolar opacities consistent with pulmonary edema/ARDS. Superimposed pneumonia may be present.   Electronically Signed   By: David  SwazilandJordan   On: 04/23/2014 07:32   Dg Chest Port 1 View  04/22/2014   CLINICAL DATA:  Pulmonary edema, MI  EXAM: PORTABLE CHEST - 1 VIEW  COMPARISON:  Portable exam 0612 hr compared to 04/21/2014  FINDINGS: Tracheostomy tube tip projects approximate 6.5 cm above carina.  Feeding tube extends into stomach.  LEFT subclavian central venous catheter with tip projecting over SVC.  Numerous EKG leads project over central chest.  Rotated to the LEFT.  Stable heart size mediastinal contours.  BILATERAL airspace infiltrates which could represent edema, pneumonia or ARDS, slightly improved.  Small the RIGHT pleural effusion.  No pneumothorax.  Bones unremarkable.  IMPRESSION: Improved airspace infiltrates which could represent edema, infection or ARDS.   Electronically Signed   By: Ulyses SouthwardMark  Boles M.D.   On: 04/22/2014 07:30   Dg Chest Port 1 View  04/21/2014   CLINICAL DATA:  Tracheostomy complication  EXAM: PORTABLE CHEST - 1 VIEW  COMPARISON:  04/21/2014  FINDINGS: Cardiomediastinal silhouette is stable. Thoracostomy tube and NG tube is unchanged in position. Again noted bilateral diffuse airspace disease highly suspicious for pneumonia or ARDS. Superimposed pulmonary edema cannot be excluded. Clinical correlation is necessary. There is no pneumothorax. Stable left subclavian central line position.  IMPRESSION: Stable support apparatus. Again noted bilateral diffuse airspace disease highly suspicious for pneumonia or ARDS, superimposed pulmonary edema cannot be excluded. Clinical correlation is necessary. No pneumothorax.   Electronically Signed   By: Natasha MeadLiviu  Pop M.D.   On: 04/21/2014 12:37     Medications:     Scheduled Medications: . alteplase  2 mg Intracatheter Once  . antiseptic oral rinse  7 mL Mouth Rinse QID  . aspirin  81 mg Per Tube  Daily  . atorvastatin  80 mg Per Tube q1800  . ceFEPime (MAXIPIME) IV  1 g Intravenous Q8H  . chlorhexidine  15 mL Mouth Rinse BID  . docusate  100 mg Oral BID  . feeding supplement (PRO-STAT SUGAR FREE 64)  60 mL Per Tube TID  . feeding supplement (VITAL HIGH PROTEIN)  1,000 mL Per Tube Q24H  . furosemide  40 mg Intravenous Q8H  . heparin subcutaneous  5,000 Units Subcutaneous 3 times per day  . insulin aspart  0-15 Units Subcutaneous 6 times per day  . insulin glargine  35 Units Subcutaneous Daily  . ipratropium  0.5 mg Nebulization Q6H  . levalbuterol  0.63 mg Nebulization Q6H  . multivitamin  5 mL  Per Tube Daily  . pantoprazole sodium  40 mg Per Tube Q24H  . potassium chloride  40 mEq Per Tube Q4H  . QUEtiapine  100 mg Oral BID  . sodium chloride  10-40 mL Intracatheter Q12H  . ticagrelor  90 mg Per Tube BID  . vancomycin  1,000 mg Intravenous Q12H  . vecuronium  10 mg Intravenous Once    Infusions: . amiodarone 30 mg/hr (04/23/14 0333)  . cisatracurium (NIMBEX) infusion Stopped (04/17/14 1115)  . fentaNYL infusion INTRAVENOUS 100 mcg/hr (04/23/14 0730)  . midazolam (VERSED) infusion 1 mg/hr (04/23/14 0730)  . norepinephrine (LEVOPHED) Adult infusion 5 mcg/min (04/23/14 0500)    PRN Medications: Place/Maintain arterial line **AND** sodium chloride, Place/Maintain arterial line **AND** sodium chloride, acetaminophen (TYLENOL) oral liquid 160 mg/5 mL, bisacodyl, fentaNYL, Influenza vac split quadrivalent PF, levalbuterol, ondansetron (ZOFRAN) IV, pneumococcal 23 valent vaccine, sennosides, sodium chloride, sorbitol   Assessment:   1. Cardiogenic shock    --s/p Impella placement 1/31, Impella out 2/5.  2. Acute on chronic systolic HF    --EF 10% on echo    --Repeat echo (2/7) with EF 25-30%, peri-apical akinesis, no LV thrombus, normal RV 3. Anterolateral STEMI 1/31   --due to stent occlusion   --s/p PCI with DES to LAD and OM-2 on 1/31   --s/p PCI with DES to RCA  2/5 4. VDRF 5. CAD 6. Tobacco abuse ongoing 7. 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 Hypomagnesemia/hyponatremia 9. Anemia s/p 3u RBCs 10. SVT: Amiodarone started 11. MRSA Pneumonia, hospital acquired 12. Acute kidney injury 13. Hypernatremia  Plan/Discussion:    Main issue now is severe MRSA PNA and ARDS. S/p trach c/b air leak.   P/CCM managing ARDS and airway.   HF currently stable. Supp K+. Continue norepi for BP support. Continue lasix.  Continue ASA, statin, brilinta.   Continue IV amio for AF.    The patient is critically ill with multiple organ systems failure and requires high complexity decision making for assessment and support, frequent evaluation and titration of therapies, application of advanced monitoring technologies and extensive interpretation of multiple databases.   Critical Care Time devoted to patient care services described in this note is 35 Minutes.   Length of Stay: 22 Arvilla Meres MD 04/23/2014, 8:00 AM  Advanced Heart Failure Team Pager [phone] (M-F; 7a - 4p)  Please contact [facility] for night-coverage after hours (4p -7a ) and weekends on [email or web address]

## 2014-04-24 ENCOUNTER — Inpatient Hospital Stay (HOSPITAL_COMMUNITY): Payer: Medicare Other

## 2014-04-24 DIAGNOSIS — J15212 Pneumonia due to Methicillin resistant Staphylococcus aureus: Secondary | ICD-10-CM

## 2014-04-24 LAB — COMPREHENSIVE METABOLIC PANEL
ALT: 68 U/L — ABNORMAL HIGH (ref 0–53)
ANION GAP: 3 — AB (ref 5–15)
AST: 78 U/L — AB (ref 0–37)
Albumin: 1.7 g/dL — ABNORMAL LOW (ref 3.5–5.2)
Alkaline Phosphatase: 227 U/L — ABNORMAL HIGH (ref 39–117)
BUN: 98 mg/dL — ABNORMAL HIGH (ref 6–23)
CALCIUM: 8 mg/dL — AB (ref 8.4–10.5)
CO2: 37 mmol/L — ABNORMAL HIGH (ref 19–32)
Chloride: 108 mmol/L (ref 96–112)
Creatinine, Ser: 1.37 mg/dL — ABNORMAL HIGH (ref 0.50–1.35)
GFR, EST AFRICAN AMERICAN: 64 mL/min — AB (ref >90)
GFR, EST NON AFRICAN AMERICAN: 55 mL/min — AB (ref >90)
GLUCOSE: 131 mg/dL — AB (ref 70–99)
Potassium: 3 mmol/L — ABNORMAL LOW (ref 3.5–5.1)
Sodium: 148 mmol/L — ABNORMAL HIGH (ref 135–145)
Total Bilirubin: 1.1 mg/dL (ref 0.3–1.2)
Total Protein: 5.7 g/dL — ABNORMAL LOW (ref 6.0–8.3)

## 2014-04-24 LAB — CBC
HCT: 26.6 % — ABNORMAL LOW (ref 39.0–52.0)
HEMOGLOBIN: 7.7 g/dL — AB (ref 13.0–17.0)
MCH: 27.9 pg (ref 26.0–34.0)
MCHC: 28.9 g/dL — ABNORMAL LOW (ref 30.0–36.0)
MCV: 96.4 fL (ref 78.0–100.0)
Platelets: 256 10*3/uL (ref 150–400)
RBC: 2.76 MIL/uL — ABNORMAL LOW (ref 4.22–5.81)
RDW: 20.1 % — ABNORMAL HIGH (ref 11.5–15.5)
WBC: 7.8 10*3/uL (ref 4.0–10.5)

## 2014-04-24 LAB — CULTURE, BLOOD (ROUTINE X 2)
CULTURE: NO GROWTH
Culture: NO GROWTH

## 2014-04-24 LAB — POCT I-STAT 3, ART BLOOD GAS (G3+)
Acid-Base Excess: 8 mmol/L — ABNORMAL HIGH (ref 0.0–2.0)
Bicarbonate: 33 mEq/L — ABNORMAL HIGH (ref 20.0–24.0)
O2 SAT: 99 %
Patient temperature: 98.6
TCO2: 34 mmol/L (ref 0–100)
pCO2 arterial: 49.6 mmHg — ABNORMAL HIGH (ref 35.0–45.0)
pH, Arterial: 7.431 (ref 7.350–7.450)
pO2, Arterial: 127 mmHg — ABNORMAL HIGH (ref 80.0–100.0)

## 2014-04-24 LAB — PROTIME-INR
INR: 1.18 (ref 0.00–1.49)
PROTHROMBIN TIME: 15.2 s (ref 11.6–15.2)

## 2014-04-24 LAB — GLUCOSE, CAPILLARY
GLUCOSE-CAPILLARY: 130 mg/dL — AB (ref 70–99)
Glucose-Capillary: 138 mg/dL — ABNORMAL HIGH (ref 70–99)
Glucose-Capillary: 158 mg/dL — ABNORMAL HIGH (ref 70–99)
Glucose-Capillary: 136 mg/dL — ABNORMAL HIGH (ref 70–99)
Glucose-Capillary: 159 mg/dL — ABNORMAL HIGH (ref 70–99)

## 2014-04-24 MED ORDER — POTASSIUM CHLORIDE 20 MEQ/15ML (10%) PO SOLN
40.0000 meq | ORAL | Status: AC
  Administered 2014-04-24 (×2): 40 meq
  Filled 2014-04-24 (×2): qty 30

## 2014-04-24 MED ORDER — MIDAZOLAM HCL 2 MG/2ML IJ SOLN
4.0000 mg | Freq: Once | INTRAMUSCULAR | Status: AC
  Administered 2014-04-24: 4 mg via INTRAVENOUS

## 2014-04-24 MED ORDER — SODIUM CHLORIDE 0.9 % IV SOLN
INTRAVENOUS | Status: DC
Start: 2014-04-24 — End: 2014-05-02
  Administered 2014-04-26: 10:00:00 via INTRAVENOUS
  Administered 2014-04-30: 10 mL/h via INTRAVENOUS

## 2014-04-24 MED ORDER — QUETIAPINE FUMARATE 50 MG PO TABS
50.0000 mg | ORAL_TABLET | Freq: Every day | ORAL | Status: DC
  Administered 2014-04-24 – 2014-04-29 (×6): 50 mg via ORAL
  Filled 2014-04-24 (×8): qty 1

## 2014-04-24 NOTE — Progress Notes (Signed)
RT note-Called to room for sp02 decreased to 84%, by RN, now at 87%. Recruitment maneuver performed, sp02 only up to 91%, now at 88% again. Respiratory rate 36 bpm, RN and MD aware.

## 2014-04-24 NOTE — Progress Notes (Signed)
eLink Physician-Brief Progress Note Patient Name: Micheal Ayala DOB: 03/30/1955 MRN: [id number]   Date of Service  04/24/2014  HPI/Events of Note  Hypokalemia  eICU Interventions  Potassium replaced     Intervention Category Intermediate Interventions: Electrolyte abnormality - evaluation and management  Nicholos Aloisi 04/24/2014, 6:29 AM

## 2014-04-24 NOTE — Progress Notes (Addendum)
Patient ID: Micheal Ayala, male   DOB: 1955/11/16, 59 y.o.   MRN: [id number]  Advanced Heart Failure Rounding Note   Subjective:    Impella removed 2/5.  Echo (2/7) with EF 25-30%, peri-apical akinesis, no LV thrombus noted, normal RV.   Underwent trach 2/16 due to severe MRSA PNA and ARDS.   FIO2 back up to 80%. Levophed up to 7. Remains agitated. Continues on lasix. Weight and creatinine stable.   Remians NSR on IV amio    Objective:   Weight Range:  Vital Signs:   Temp:  [98.6 F (37 C)-100 F (37.8 C)] 98.6 F (37 C) (02/23 0400) Pulse Rate:  [80-105] 80 (02/23 0700) Resp:  [18-37] 22 (02/23 0700) BP: (88-102)/(41-66) 89/41 mmHg (02/23 0259) SpO2:  [89 %-100 %] 99 % (02/23 0728) Arterial Line BP: (87-133)/(41-65) 105/48 mmHg (02/23 0700) FiO2 (%):  [60 %-100 %] 80 % (02/23 0728) Weight:  [102.3 kg (225 lb 8.5 oz)] 102.3 kg (225 lb 8.5 oz) (02/23 0428) Last BM Date: 04/17/14  Weight change: Filed Weights   04/22/14 0359 04/23/14 0328 04/24/14 0428  Weight: 103.4 kg (227 lb 15.3 oz) 102.1 kg (225 lb 1.4 oz) 102.3 kg (225 lb 8.5 oz)    Intake/Output:   Intake/Output Summary (Last 24 hours) at 04/24/14 0805 Last data filed at 04/24/14 0700  Gross per 24 hour  Intake 2524.85 ml  Output   2150 ml  Net 374.85 ml     Physical Exam: General:  On trach.  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   Cor: PMI laterally displaced. Regular  Lungs: diffuse rhonchi Abdomen: soft, nontender, + mildly distended. No hepatosplenomegaly. No bruits or masses. Hypoactive bowel sounds. Extremities: no cyanosis, clubbing, rash, 1+ edema. Neuro: awake on vent. agitated  Telemetry: Sinus 90-100  Labs: Basic Metabolic Panel:  Recent Labs Lab 04/19/14 0411  04/20/14 0320 04/20/14 1910 04/21/14 0340 04/22/14 0400 04/23/14 0400 04/24/14 0420  NA 143  < > 144 144 144 143 145 148*  K 2.1*  < > 3.6 2.9* 3.8 3.4* 3.2* 3.0*   CL 98  < > 102 98 103 101 101 108  CO2 36*  < > 33* 32 32 36* 32 37*  GLUCOSE 139*  < > 118* 135* 151* 168* 141* 131*  BUN 95*  < > 107* 104* 104* 100* 94* 98*  CREATININE 1.36*  < > 1.47* 1.55* 1.50* 1.53* 1.35 1.37*  CALCIUM 7.8*  < > 8.0* 8.1* 8.2* 7.8* 8.0* 8.0*  MG 2.2  --  2.4  --  2.4 2.3 2.3  --   PHOS 3.7  --  4.0  --  3.6 4.2 3.4  --   < > = values in this interval not displayed.  Liver Function Tests:  Recent Labs Lab 04/22/14 0400 04/24/14 0420  AST 82* 78*  ALT 75* 68*  ALKPHOS 317* 227*  BILITOT 1.4* 1.1  PROT 5.9* 5.7*  ALBUMIN 1.9* 1.7*   No results for input(s): LIPASE, AMYLASE in the last 168 hours. No results for input(s): AMMONIA in the last 168 hours.  CBC:  Recent Labs Lab 04/20/14 0320 04/21/14 0340 04/22/14 0400 04/23/14 0400 04/24/14 0420  WBC 14.0* 11.4* 13.7* 11.2* 7.8  NEUTROABS  --   --  11.7* 10.0*  --   HGB 9.1* 9.1* 8.8* 8.2* 7.7*  HCT 30.4* 30.5* 30.6* 28.0* 26.6*  MCV 93.8 94.4 96.2 96.2 96.4  PLT 317 311  322 267 256    Cardiac Enzymes: No results for input(s): CKTOTAL, CKMB, CKMBINDEX, TROPONINI in the last 168 hours.  BNP: BNP (last 3 results) No results for input(s): PROBNP in the last 8760 hours.   Other results:    Imaging: Dg Chest Port 1 View  04/23/2014   CLINICAL DATA:  Shortness of breath, myocardial infarction, history of tobacco use.  EXAM: PORTABLE CHEST - 1 VIEW  COMPARISON:  Portable chest x-ray of February 20th and April 22, 2014 and April 16, 2014.  FINDINGS: The lungs are well-expanded. The bilateral confluent alveolar opacities are more conspicuous today. A small right pleural effusion persists. The cardiac silhouette is enlarged. The pulmonary vascularity is engorged and indistinct. The tracheostomy appliance tube tip projects 5.2 cm above the carina. The esophagogastric tube tip projects below the inferior margin of the image. The left subclavian catheter tip projects over the midportion of the  SVC.  IMPRESSION: There is mild interval worsening in the bilateral alveolar opacities consistent with pulmonary edema/ARDS. Superimposed pneumonia may be present.   Electronically Signed   By: David  [place]   On: 04/23/2014 07:32   Dg Abd Portable 1v  04/23/2014   CLINICAL DATA:  59 year old male under evaluation for nasogastric tube placement.  EXAM: PORTABLE ABDOMEN - 1 VIEW  COMPARISON:  Abdominal radiograph 04/20/2014.  FINDINGS: Feeding tube is present in the body of the stomach. Visualized bowel gas pattern is nonobstructive. Extensive airspace consolidation noted in the left lung base.  IMPRESSION: 1. Tip of feeding tube in the body of the stomach.   Electronically Signed   By: Trudie Reedaniel  Entrikin M.D.   On: 04/23/2014 15:20     Medications:     Scheduled Medications: . alteplase  2 mg Intracatheter Once  . antiseptic oral rinse  7 mL Mouth Rinse QID  . aspirin  81 mg Per Tube Daily  . atorvastatin  80 mg Per Tube q1800  . chlorhexidine  15 mL Mouth Rinse BID  . docusate  100 mg Oral BID  . feeding supplement (PRO-STAT SUGAR FREE 64)  60 mL Per Tube TID  . feeding supplement (VITAL HIGH PROTEIN)  1,000 mL Per Tube Q24H  . furosemide  40 mg Intravenous Q8H  . heparin subcutaneous  5,000 Units Subcutaneous 3 times per day  . insulin aspart  0-15 Units Subcutaneous 6 times per day  . insulin glargine  35 Units Subcutaneous Daily  . ipratropium  0.5 mg Nebulization Q6H  . levalbuterol  0.63 mg Nebulization Q6H  . multivitamin  5 mL Per Tube Daily  . pantoprazole sodium  40 mg Per Tube Q24H  . potassium chloride  40 mEq Per Tube Q4H  . QUEtiapine  50 mg Oral BID  . sodium chloride  10-40 mL Intracatheter Q12H  . ticagrelor  90 mg Per Tube BID  . vecuronium  10 mg Intravenous Once    Infusions: . amiodarone 30 mg/hr (04/24/14 0429)  . cisatracurium (NIMBEX) infusion Stopped (04/17/14 1115)  . fentaNYL infusion INTRAVENOUS 150 mcg/hr (04/24/14 0622)  . midazolam (VERSED) infusion  2 mg/hr (04/23/14 1855)  . norepinephrine (LEVOPHED) Adult infusion 8 mcg/min (04/24/14 0300)    PRN Medications: Place/Maintain arterial line **AND** sodium chloride, Place/Maintain arterial line **AND** sodium chloride, acetaminophen (TYLENOL) oral liquid 160 mg/5 mL, bisacodyl, fentaNYL, Influenza vac split quadrivalent PF, levalbuterol, ondansetron (ZOFRAN) IV, pneumococcal 23 valent vaccine, sennosides, sodium chloride, sorbitol   Assessment:   1. Cardiogenic shock    --s/p Impella placement  1/31, Impella out 2/5.  2. Acute on chronic systolic HF    --EF 10% on echo    --Repeat echo (2/7) with EF 25-30%, peri-apical akinesis, no LV thrombus, normal RV 3. Anterolateral STEMI 1/31   --due to stent occlusion   --s/p PCI with DES to LAD and OM-2 on 1/31   --s/p PCI with DES to RCA 2/5 4. VDRF 5. CAD 6. Tobacco abuse ongoing 7. 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 Hypomagnesemia/hyponatremia 9. Anemia s/p 3u RBCs 10. SVT: Amiodarone started 11. MRSA Pneumonia, hospital acquired 12. Acute kidney injury 13. Hypernatremia  Plan/Discussion:    Main issue now is severe MRSA PNA and ARDS. S/p trach c/b air leak.   Despite trach respiratory status remains very tenuous and actually had to go back up on FiO2. P/CCM managing ARDS and airway. Many benefit from RBC transfusion. 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   HF currently stable. Supp K+. Continue norepi for BP support. Continue lasix.  Continue ASA, statin, brilinta.   Continue IV amio for AF.   Prognosis increasingly concerning.   Length of Stay: 23 Arvilla Meres MD 04/24/2014, 8:05 AM  Advanced Heart Failure Team Pager [phone] (M-F; 7a - 4p)  Please contact [facility] for night-coverage after hours (4p -7a ) and weekends on [email or web address]

## 2014-04-24 NOTE — Progress Notes (Signed)
ABG    Component Value Date/Time   PHART 7.474* 04/23/2014 0354   PCO2ART 45.4* 04/23/2014 0354   PO2ART 90.5 04/23/2014 0354   HCO3 32.9* 04/23/2014 0354   TCO2 34.3 04/23/2014 0354   ACIDBASEDEF 0.4 04/06/2014 0500   O2SAT 98.5 04/23/2014 0354   Vent Mode:  [-] PRVC FiO2 (%):  [0.6 %-100 %] 80 % Set Rate:  [16 bmp] 16 bmp Vt Set:  [520 mL] 520 mL PEEP:  [10 cmH20-12 cmH20] 12 cmH20 Plateau Pressure:  [30 cmH20-36 cmH20] 30 cmH20

## 2014-04-24 NOTE — Progress Notes (Signed)
Paged MD, pt breathing 30+ times a minutes, SATS 85% on 100%. One time orders given.   Will continue to monitor.   1430: Current sats 97%, RR 27.   Lorana Maffeo, Lily PeerEAL R, RN

## 2014-04-24 NOTE — Progress Notes (Signed)
PULMONARY / CRITICAL CARE MEDICINE   Name: Micheal Ayala MRN: [id number] DOB: 02/25/1956    ADMISSION DATE:  08/16/2014 CONSULTATION DATE:  12/17/14  REFERRING MD :  Dr. Herbie BaltimoreHarding  CHIEF COMPLAINT:  STEMI  INITIAL PRESENTATION:  59 yo smoker with STEMI, cardiogenic shock, VDRF.  He is visiting [place] from [place].  STUDIES:  1/31 Cardiac cath >> severe CAD with occlusion of LAD, circumflex, RCS, ischemic CM >> DES to LAD, circumflex 1/31 Echo >> LVEF 20-25%, severely reduced systolic function 2/04 LHC > DES RCA 2/06 Echo >> EF 25 to 30%, mod LVH, grade 2 diastolic dysfx  SIGNIFICANT EVENTS: 1/31 admit, cardiac cath, impella 2/01 TCTS consulted, transfuse 1 unit PRBC 2/05 impella removed; ?upper GI bleeding  2/08 increased respiratory secretions 2/10 ARDS protocol, added levophed 2/11 start paralytic 2/12 - tried off nimbex, liberated to 7cc/kg  2/19- trach placed 2/20- trach huge cuff leak, desaturation, emergent ETT then retrach 2/21- pressors increased  SUBJECTIVE:  I decreased PEEP to 10 on 2/22 but he only tolerated x 45 minutes, then desaturation.  PEEP back to 12, fiO2 at 80% Fentanyl decreased 2/23 norepi at 7  VITAL SIGNS: Temp:  [98.6 F (37 C)-100 F (37.8 C)] 98.6 F (37 C) (02/23 0400) Pulse Rate:  [80-104] 85 (02/23 0800) Resp:  [18-37] 18 (02/23 0800) BP: (89-102)/(41-66) 89/41 mmHg (02/23 0259) SpO2:  [89 %-100 %] 98 % (02/23 0800) Arterial Line BP: (87-133)/(41-65) 102/51 mmHg (02/23 0800) FiO2 (%):  [60 %-100 %] 80 % (02/23 0728) Weight:  [102.3 kg (225 lb 8.5 oz)] 102.3 kg (225 lb 8.5 oz) (02/23 0428) HEMODYNAMICS:   VENTILATOR SETTINGS: Vent Mode:  [-] PRVC FiO2 (%):  [60 %-100 %] 80 % Set Rate:  [16 bmp] 16 bmp Vt Set:  [520 mL] 520 mL PEEP:  [10 cmH20-12 cmH20] 12 cmH20 Plateau Pressure:  [25 cmH20-36 cmH20] 25 cmH20 INTAKE / OUTPUT:  Intake/Output Summary (Last 24 hours) at 04/24/14 0906 Last data filed at 04/24/14 0830  Gross per 24  hour  Intake 2436.65 ml  Output   2550 ml  Net -113.35 ml   PHYSICAL EXAMINATION:  Gen: sedated, comfortable HEENT: Rt eye enucleated PULM: B coarse BS, B crackles CV: regular, 100's, no M AB: decreased bowel sounds, soft, no r/g Ext: 2+ BLE edema  Neuro: Moves with stim, pain. Does not follow commands am 2/23  LABS:  CBC  Recent Labs Lab 04/22/14 0400 04/23/14 0400 04/24/14 0420  WBC 13.7* 11.2* 7.8  HGB 8.8* 8.2* 7.7*  HCT 30.6* 28.0* 26.6*  PLT 322 267 256   Coag's  Recent Labs Lab 04/19/14 1530 04/24/14 0420  APTT 23*  --   INR 1.20 1.18   BMET  Recent Labs Lab 04/22/14 0400 04/23/14 0400 04/24/14 0420  NA 143 145 148*  K 3.4* 3.2* 3.0*  CL 101 101 108  CO2 36* 32 37*  BUN 100* 94* 98*  CREATININE 1.53* 1.35 1.37*  GLUCOSE 168* 141* 131*   Electrolytes  Recent Labs Lab 04/21/14 0340 04/22/14 0400 04/23/14 0400 04/24/14 0420  CALCIUM 8.2* 7.8* 8.0* 8.0*  MG 2.4 2.3 2.3  --   PHOS 3.6 4.2 3.4  --    ABG  Recent Labs Lab 04/22/14 0825 04/23/14 0354 04/24/14 0537  PHART 7.416 7.474* 7.431  PCO2ART 49.7* 45.4* 49.6*  PO2ART 58.4* 90.5 127.0*   Glucose  Recent Labs Lab 04/23/14 1211 04/23/14 1547 04/23/14 2012 04/24/14 0014 04/24/14 0419 04/24/14 0813  GLUCAP 167* 103* 138*  158* 130* 136*   PCXR 2/22 > no change B alveolar infiltrates, trach well positioned  ASSESSMENT / PLAN:  PULMONARY ETT 1/31>>>2/19 Trach [name] 2/19>>>re do 2/20>>> A:  Acute respiratory failure due to cardiogenic shock and pulmonary edema.   Superimposed MRSA HCAP +  severe ARDS. Tobacco abuse. 2/20 emergent re-trach from cuff leak  P:   - Cont ARDS protocol, keep P plat less 30  - with leak concerns would like to lower peep, goal 10 but he has not tolerated this - will need an extra long 6 placed, probably end of week  - neg balance as able, + 8.7 L for hospitalization as of 2/23  CARDIOVASCULAR L Impella 1/31>>>2/5 Lt [place] CVL 2/08 >> A:   Cardiogenic shock post STEMI on admission - echo 2/7 EF 25-30%.  STEMI r/t stent occlusion, now s/p DES to LAD and RCA Septic shock developed from MRSA PNA 2/10. SVT, A fib with RVR. P: - Amiodarone, brilinta per cardiology - Continue ASA, lipitor. Digoxin stopped - continue to wean norepi, currently for goal MAP > 60. Could consider making a lower goal depending on our ability to wean  - ? Whether he would benefit from dobuta or milrinone to facilitate slow diuresis  RENAL A:  AKI in setting of cardiogenic/septic shock. Hypokalemia. Hypernatremia resolved P:   - Monitor BMET and UOP, note rising Na, may be approaching dry wt - on Lasix 40 q8h - replete K  GASTROINTESTINAL A:   Nutrition. Constipation. ?upper GI bleed 2/05 >> no further episodes since. P:   - Protonix > changed to qd 2/8 - Bowel regimen ordered, no BM reported since 2/16 - TF on going  HEMATOLOGIC A:  Thrombocytopenia >> likely from impella >> resolved. Anemia of critical illness. P:  - F/u CBC. - SQ heparin for DVT prevention, no evidence bleeding  INFECTIOUS A: MRSA HCAP. Febrile overnight. P:    - Vanc 2/8>>>  - Cefepime 2/17>>>2/22  - Blood 2/1 >> negative - Blood 2/08 >> negative - Sputum 2/08 >> MRSA - Blood 2/17>>> negative  D/c cefepime 2/22 and follow clinically, follow ability to wean norepi D/c vanco 2/23, completed 16 days  ENDOCRINE A:   Hyperglycemia. P:   - SSI with lantus - goal CBG 140-180  NEUROLOGIC A:  Acute encephalopathy 2nd to respiratory failure, cardiogenic shock. P:   - RASS goal to -1 on 2/22 - fentanyl gtt, goal to 50-75 - D/c versed gtt - decrease seroquel to 50 qhs  Independent CC time 35 minutes   Levy Pupa, MD, PhD 04/24/2014, 9:06 AM [facility] [phone] or if no answer [phone]

## 2014-04-25 ENCOUNTER — Inpatient Hospital Stay (HOSPITAL_COMMUNITY): Payer: Medicare Other

## 2014-04-25 LAB — MAGNESIUM: Magnesium: 2.4 mg/dL (ref 1.5–2.5)

## 2014-04-25 LAB — BASIC METABOLIC PANEL
ANION GAP: 5 (ref 5–15)
Anion gap: 8 (ref 5–15)
BUN: 106 mg/dL — ABNORMAL HIGH (ref 6–23)
BUN: 113 mg/dL — ABNORMAL HIGH (ref 6–23)
CALCIUM: 8 mg/dL — AB (ref 8.4–10.5)
CHLORIDE: 111 mmol/L (ref 96–112)
CO2: 33 mmol/L — ABNORMAL HIGH (ref 19–32)
CO2: 31 mmol/L (ref 19–32)
CREATININE: 1.34 mg/dL (ref 0.50–1.35)
CREATININE: 1.46 mg/dL — AB (ref 0.50–1.35)
Calcium: 7.9 mg/dL — ABNORMAL LOW (ref 8.4–10.5)
Chloride: 109 mmol/L (ref 96–112)
GFR calc non Af Amer: 57 mL/min — ABNORMAL LOW (ref >90)
GFR calc non Af Amer: 51 mL/min — ABNORMAL LOW (ref >90)
GFR, EST AFRICAN AMERICAN: 66 mL/min — AB (ref >90)
GFR, EST AFRICAN AMERICAN: 59 mL/min — AB (ref >90)
Glucose, Bld: 124 mg/dL — ABNORMAL HIGH (ref 70–99)
Glucose, Bld: 141 mg/dL — ABNORMAL HIGH (ref 70–99)
POTASSIUM: 4 mmol/L (ref 3.5–5.1)
Potassium: 2.9 mmol/L — ABNORMAL LOW (ref 3.5–5.1)
SODIUM: 150 mmol/L — AB (ref 135–145)
Sodium: 147 mmol/L — ABNORMAL HIGH (ref 135–145)

## 2014-04-25 LAB — CBC
HCT: 28.3 % — ABNORMAL LOW (ref 39.0–52.0)
Hemoglobin: 8.2 g/dL — ABNORMAL LOW (ref 13.0–17.0)
MCH: 28.2 pg (ref 26.0–34.0)
MCHC: 29 g/dL — AB (ref 30.0–36.0)
MCV: 97.3 fL (ref 78.0–100.0)
Platelets: 318 10*3/uL (ref 150–400)
RBC: 2.91 MIL/uL — ABNORMAL LOW (ref 4.22–5.81)
RDW: 20 % — AB (ref 11.5–15.5)
WBC: 10.5 10*3/uL (ref 4.0–10.5)

## 2014-04-25 LAB — PHOSPHORUS: Phosphorus: 3 mg/dL (ref 2.3–4.6)

## 2014-04-25 LAB — GLUCOSE, CAPILLARY
GLUCOSE-CAPILLARY: 135 mg/dL — AB (ref 70–99)
GLUCOSE-CAPILLARY: 151 mg/dL — AB (ref 70–99)
Glucose-Capillary: 116 mg/dL — ABNORMAL HIGH (ref 70–99)
Glucose-Capillary: 123 mg/dL — ABNORMAL HIGH (ref 70–99)
Glucose-Capillary: 143 mg/dL — ABNORMAL HIGH (ref 70–99)
Glucose-Capillary: 171 mg/dL — ABNORMAL HIGH (ref 70–99)
Glucose-Capillary: 180 mg/dL — ABNORMAL HIGH (ref 70–99)

## 2014-04-25 MED ORDER — DEXTROSE 5 % IV SOLN
INTRAVENOUS | Status: AC
  Administered 2014-04-25: 07:00:00 via INTRAVENOUS

## 2014-04-25 MED ORDER — POTASSIUM CHLORIDE 20 MEQ/15ML (10%) PO SOLN
40.0000 meq | Freq: Three times a day (TID) | ORAL | Status: AC
  Administered 2014-04-25 (×3): 40 meq
  Filled 2014-04-25 (×6): qty 30

## 2014-04-25 MED ORDER — FREE WATER
200.0000 mL | Freq: Three times a day (TID) | Status: DC
  Administered 2014-04-25 – 2014-04-27 (×8): 200 mL

## 2014-04-25 NOTE — Progress Notes (Signed)
LEVOPHED drip titrated to OFF. BP stable at this time.  Rise PaganiniURRY, Ange Puskas R, RN

## 2014-04-25 NOTE — Progress Notes (Signed)
RT called to the room for spO2 decrease and RN unable to get them back up. RT performed recruitment maneuver and spO2 is now 96%. Patient is maintaining spO2 in the 90's at this time. RT will continue to monitor.

## 2014-04-25 NOTE — Progress Notes (Signed)
Patient ID: Micheal Ayala, male   DOB: 03/18/1955, 59 y.o.   MRN: [id number]  Advanced Heart Failure Rounding Note   Subjective:    Impella removed 2/5.  Echo (2/7) with EF 25-30%, peri-apical akinesis, no LV thrombus noted, normal RV.   Underwent trach 2/16 due to severe MRSA PNA and ARDS.   Still struggling from a respiratory status. FIO2 back up to 80%. Levophed at 6. Intermittently responsive. Back on D5W for hypernatremia.   Weight down.   Remians NSR on IV amio    Objective:   Weight Range:  Vital Signs:   Temp:  [98.1 F (36.7 C)-98.7 F (37.1 C)] 98.5 F (36.9 C) (02/24 0758) Pulse Rate:  [73-107] 101 (02/24 0800) Resp:  [23-41] 34 (02/24 0800) BP: (95-113)/(50-65) 95/65 mmHg (02/24 0758) SpO2:  [86 %-100 %] 96 % (02/24 0800) Arterial Line BP: (93-154)/(49-75) 114/59 mmHg (02/24 0800) FiO2 (%):  [60 %-100 %] 80 % (02/24 0800) Weight:  [100.5 kg (221 lb 9 oz)] 100.5 kg (221 lb 9 oz) (02/24 0300) Last BM Date: 04/17/14  Weight change: Filed Weights   04/23/14 0328 04/24/14 0428 04/25/14 0300  Weight: 102.1 kg (225 lb 1.4 oz) 102.3 kg (225 lb 8.5 oz) 100.5 kg (221 lb 9 oz)    Intake/Output:   Intake/Output Summary (Last 24 hours) at 04/25/14 0931 Last data filed at 04/25/14 0900  Gross per 24 hour  Intake 2878.71 ml  Output   2850 ml  Net  28.71 ml     Physical Exam: General:  On trach.  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   Cor: PMI laterally displaced. Regular  Lungs: diffuse rhonchi Abdomen: soft, nontender, + mildly distended. No hepatosplenomegaly. No bruits or masses. Hypoactive bowel sounds. Extremities: no cyanosis, clubbing, rash, 1-2+ edema. Neuro: awake on vent. agitated  Telemetry: Sinus 80-90  Labs: Basic Metabolic Panel:  Recent Labs Lab 04/20/14 0320  04/21/14 0340 04/22/14 0400 04/23/14 0400 04/24/14 0420 04/25/14 0500  NA 144  < > 144 143 145 148* 150*  K 3.6  < >  3.8 3.4* 3.2* 3.0* 2.9*  CL 102  < > 103 101 101 108 109  CO2 33*  < > 32 36* 32 37* 33*  GLUCOSE 118*  < > 151* 168* 141* 131* 124*  BUN 107*  < > 104* 100* 94* 98* 106*  CREATININE 1.47*  < > 1.50* 1.53* 1.35 1.37* 1.34  CALCIUM 8.0*  < > 8.2* 7.8* 8.0* 8.0* 8.0*  MG 2.4  --  2.4 2.3 2.3  --  2.4  PHOS 4.0  --  3.6 4.2 3.4  --  3.0  < > = values in this interval not displayed.  Liver Function Tests:  Recent Labs Lab 04/22/14 0400 04/24/14 0420  AST 82* 78*  ALT 75* 68*  ALKPHOS 317* 227*  BILITOT 1.4* 1.1  PROT 5.9* 5.7*  ALBUMIN 1.9* 1.7*   No results for input(s): LIPASE, AMYLASE in the last 168 hours. No results for input(s): AMMONIA in the last 168 hours.  CBC:  Recent Labs Lab 04/21/14 0340 04/22/14 0400 04/23/14 0400 04/24/14 0420 04/25/14 0500  WBC 11.4* 13.7* 11.2* 7.8 10.5  NEUTROABS  --  11.7* 10.0*  --   --   HGB 9.1* 8.8* 8.2* 7.7* 8.2*  HCT 30.5* 30.6* 28.0* 26.6* 28.3*  MCV 94.4 96.2 96.2 96.4 97.3  PLT 311 322 267 256 318    Cardiac Enzymes:  No results for input(s): CKTOTAL, CKMB, CKMBINDEX, TROPONINI in the last 168 hours.  BNP: BNP (last 3 results) No results for input(s): PROBNP in the last 8760 hours.   Other results:    Imaging: Dg Chest Port 1 View  04/25/2014   CLINICAL DATA:  STEMI, cardiogenic shock respiratory failure  EXAM: PORTABLE CHEST - 1 VIEW  COMPARISON:  Portable chest x-ray dated April 24, 2014  FINDINGS: There has been interval increase in the alveolar opacities bilaterally. The left heart border is nearly obscured as is the left hemidiaphragm. The pulmonary vascularity is indistinct. There is a moderate-sized right pleural effusion and smaller left pleural effusion. The tracheostomy appliance tip lies at the inferior margin of the clavicular heads approximately 5.3 cm above the carina. The feeding tube tip projects below the inferior margin of the image. The left subclavian venous catheter tip projects over the  midportion of the SVC.  IMPRESSION: Interval worsening of bilateral alveolar opacities consistent with worsening interstitial edema/ARDS.   Electronically Signed   By: David  [place]   On: 04/25/2014 07:14   Dg Chest Port 1 View  04/24/2014   CLINICAL DATA:  Respiratory failure, recent MI with cardiogenic shock and persistent CHF, ARDS, MRI ascending pneumonia.  EXAM: PORTABLE CHEST - 1 VIEW  COMPARISON:  Portable chest x-ray of April 23, 2014  FINDINGS: The lungs are well-expanded. Diffuse alveolar opacities persist. There is slight improved aeration of the left lung today. There are moderate-sized bilateral pleural effusions layering posteriorly. The cardiac silhouette is top-normal in size. The pulmonary vascularity is indistinct. The tracheostomy appliance tip lies at the level of the inferior margin of the clavicular heads 5.8 cm above the crotch of the carina. The esophagogastric tube tip projects below the inferior margin of the image.  IMPRESSION: Allowing for differences in positioning there has not been significant interval change since yesterday's study. Slight improved aeration of the left lung is noted. Widespread interstitial and alveolar filling processes consistent with pulmonary edema, ARDS/pneumonia are unchanged.   Electronically Signed   By: David  [place]   On: 04/24/2014 08:05   Dg Abd Portable 1v  04/23/2014   CLINICAL DATA:  59 year old male under evaluation for nasogastric tube placement.  EXAM: PORTABLE ABDOMEN - 1 VIEW  COMPARISON:  Abdominal radiograph 04/20/2014.  FINDINGS: Feeding tube is present in the body of the stomach. Visualized bowel gas pattern is nonobstructive. Extensive airspace consolidation noted in the left lung base.  IMPRESSION: 1. Tip of feeding tube in the body of the stomach.   Electronically Signed   By: Trudie Reed M.D.   On: 04/23/2014 15:20     Medications:     Scheduled Medications: . antiseptic oral rinse  7 mL Mouth Rinse QID  . aspirin  81  mg Per Tube Daily  . atorvastatin  80 mg Per Tube q1800  . chlorhexidine  15 mL Mouth Rinse BID  . docusate  100 mg Oral BID  . feeding supplement (PRO-STAT SUGAR FREE 64)  60 mL Per Tube TID  . feeding supplement (VITAL HIGH PROTEIN)  1,000 mL Per Tube Q24H  . free water  200 mL Per Tube 3 times per day  . furosemide  40 mg Intravenous Q8H  . heparin subcutaneous  5,000 Units Subcutaneous 3 times per day  . insulin aspart  0-15 Units Subcutaneous 6 times per day  . insulin glargine  35 Units Subcutaneous Daily  . ipratropium  0.5 mg Nebulization Q6H  . levalbuterol  0.63 mg Nebulization Q6H  . multivitamin  5 mL Per Tube Daily  . pantoprazole sodium  40 mg Per Tube Q24H  . potassium chloride  40 mEq Per Tube TID  . QUEtiapine  50 mg Oral QHS  . sodium chloride  10-40 mL Intracatheter Q12H  . ticagrelor  90 mg Per Tube BID    Infusions: . sodium chloride 10 mL/hr at 04/24/14 1900  . amiodarone 30 mg/hr (04/25/14 0321)  . cisatracurium (NIMBEX) infusion Stopped (04/17/14 1115)  . dextrose 100 mL/hr at 04/25/14 0650  . fentaNYL infusion INTRAVENOUS 150 mcg/hr (04/25/14 0829)  . norepinephrine (LEVOPHED) Adult infusion 6 mcg/min (04/24/14 2000)    PRN Medications: Place/Maintain arterial line **AND** sodium chloride, Place/Maintain arterial line **AND** sodium chloride, acetaminophen (TYLENOL) oral liquid 160 mg/5 mL, bisacodyl, fentaNYL, Influenza vac split quadrivalent PF, levalbuterol, ondansetron (ZOFRAN) IV, pneumococcal 23 valent vaccine, sennosides, sodium chloride, sorbitol   Assessment:   1. Cardiogenic shock    --s/p Impella placement 1/31, Impella out 2/5.  2. Acute on chronic systolic HF    --EF 10% on echo    --Repeat echo (2/7) with EF 25-30%, peri-apical akinesis, no LV thrombus, normal RV 3. Anterolateral STEMI 1/31   --due to stent occlusion   --s/p PCI with DES to LAD and OM-2 on 1/31   --s/p PCI with DES to RCA 2/5 4. VDRF 5. CAD 6. Tobacco abuse  ongoing 7. 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 Hypomagnesemia/hyponatremia 9. Anemia s/p 3u RBCs 10. SVT: Amiodarone started 11. MRSA Pneumonia, hospital acquired 12. Acute kidney injury 13. Hypernatremia  Plan/Discussion:    Main issue now is severe MRSA PNA and ARDS. S/p trach c/b air leak.   Despite trach respiratory status remains very tenuous with little progress being made despite aggressive efforts. P/CCM managing ARDS and airway. Many benefit from RBC transfusion. 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   HF currently stable. Not much to add from that standpoint. Supp K+. Continue norepi for BP support. Continue lasix.  Continue ASA, statin, brilinta.   Continue IV amio for AF.   Prognosis increasingly concerning.   Length of Stay: 24 Arvilla Meres MD 04/25/2014, 9:31 AM  Advanced Heart Failure Team Pager [phone] (M-F; 7a - 4p)  Please contact [facility] for night-coverage after hours (4p -7a ) and weekends on [email or web address]

## 2014-04-25 NOTE — Progress Notes (Signed)
PULMONARY / CRITICAL CARE MEDICINE   Name: Micheal Ayala MRN: [id number] DOB: Jan 18, 1956    ADMISSION DATE:  03/04/2014 CONSULTATION DATE:  03/15/2014  REFERRING MD :  Dr. Herbie Baltimore  CHIEF COMPLAINT:  STEMI  INITIAL PRESENTATION:  59 yo smoker with STEMI, cardiogenic shock, VDRF.  He is visiting [place] from [place].  STUDIES:  1/31 Cardiac cath >> severe CAD with occlusion of LAD, circumflex, RCS, ischemic CM >> DES to LAD, circumflex 1/31 Echo >> LVEF 20-25%, severely reduced systolic function 2/04 LHC > DES RCA 2/06 Echo >> EF 25 to 30%, mod LVH, grade 2 diastolic dysfx  SIGNIFICANT EVENTS: 1/31 admit, cardiac cath, impella 2/01 TCTS consulted, transfuse 1 unit PRBC 2/05 impella removed; ?upper GI bleeding  2/08 increased respiratory secretions 2/10 ARDS protocol, added levophed 2/11 start paralytic 2/12 - tried off nimbex, liberated to 7cc/kg  2/19- trach placed 2/20- trach huge cuff leak, desaturation, emergent ETT then retrach 2/21- pressors increased  SUBJECTIVE:  Cuff leak loud this am until more air placed into cuff On PEEP 12 + FiO2 80  VITAL SIGNS: Temp:  [98.1 F (36.7 C)-98.7 F (37.1 C)] 98.5 F (36.9 C) (02/24 0758) Pulse Rate:  [80-103] 100 (02/24 0900) Resp:  [23-41] 25 (02/24 0900) BP: (95-113)/(50-65) 95/65 mmHg (02/24 0758) SpO2:  [88 %-100 %] 92 % (02/24 1300) Arterial Line BP: (100-124)/(50-64) 113/58 mmHg (02/24 0900) FiO2 (%):  [70 %-100 %] 80 % (02/24 1557) Weight:  [100.5 kg (221 lb 9 oz)] 100.5 kg (221 lb 9 oz) (02/24 0300) HEMODYNAMICS:   VENTILATOR SETTINGS: Vent Mode:  [-] PRVC FiO2 (%):  [70 %-100 %] 80 % Set Rate:  [16 bmp] 16 bmp Vt Set:  [520 mL] 520 mL PEEP:  [12 cmH20] 12 cmH20 Plateau Pressure:  [29 cmH20-34 cmH20] 34 cmH20 INTAKE / OUTPUT:  Intake/Output Summary (Last 24 hours) at 04/25/14 1609 Last data filed at 04/25/14 1400  Gross per 24 hour  Intake 2403.19 ml  Output   2650 ml  Net -246.81 ml   PHYSICAL  EXAMINATION:  Gen: sedated, comfortable HEENT: Rt eye enucleated PULM: B coarse BS, B crackles CV: regular, 100's, no M AB: decreased bowel sounds, soft, no r/g Ext: 2+ BLE edema  Neuro: Moves with stim, pain. Does not follow commands am 2/23  LABS:  CBC  Recent Labs Lab 04/23/14 0400 04/24/14 0420 04/25/14 0500  WBC 11.2* 7.8 10.5  HGB 8.2* 7.7* 8.2*  HCT 28.0* 26.6* 28.3*  PLT 267 256 318   Coag's  Recent Labs Lab 04/19/14 1530 04/24/14 0420  APTT 23*  --   INR 1.20 1.18   BMET  Recent Labs Lab 04/23/14 0400 04/24/14 0420 04/25/14 0500  NA 145 148* 150*  K 3.2* 3.0* 2.9*  CL 101 108 109  CO2 32 37* 33*  BUN 94* 98* 106*  CREATININE 1.35 1.37* 1.34  GLUCOSE 141* 131* 124*   Electrolytes  Recent Labs Lab 04/22/14 0400 04/23/14 0400 04/24/14 0420 04/25/14 0500  CALCIUM 7.8* 8.0* 8.0* 8.0*  MG 2.3 2.3  --  2.4  PHOS 4.2 3.4  --  3.0   ABG  Recent Labs Lab 04/22/14 0825 04/23/14 0354 04/24/14 0537  PHART 7.416 7.474* 7.431  PCO2ART 49.7* 45.4* 49.6*  PO2ART 58.4* 90.5 127.0*   Glucose  Recent Labs Lab 04/24/14 1618 04/24/14 2026 04/24/14 2358 04/25/14 0334 04/25/14 0729 04/25/14 1217  GLUCAP 123* 135* 171* 116* 143* 180*   PCXR 2/22 > no change B alveolar infiltrates, trach  well positioned  ASSESSMENT / PLAN:  PULMONARY ETT 1/31>>>2/19 Trach [name] 2/19>>>re do 2/20>>> A:  Acute respiratory failure due to cardiogenic shock and pulmonary edema.   Superimposed MRSA HCAP +  severe ARDS. Very little progress being made re vent needs Tobacco abuse. 2/20 emergent re-trach from cuff leak  P:   - Cont ARDS protocol, keep P plat less 30  - with leak concerns would like to lower peep, goal 10 but he has not tolerated this - will need an extra long 6 placed, probably end of week  - neg balance as able, + 8.7 L for hospitalization   CARDIOVASCULAR L Impella 1/31>>>2/5 Lt [place] CVL 2/08 >> A:  Cardiogenic shock post STEMI on  admission - echo 2/7 EF 25-30%.  STEMI r/t stent occlusion, now s/p DES to LAD and RCA Septic shock developed from MRSA PNA 2/10. SVT, A fib with RVR. P: - Amiodarone, brilinta per cardiology - Continue ASA, lipitor. Digoxin stopped - continue to wean norepi, currently for goal MAP > 60. Could consider making a lower goal depending on our ability to wean   RENAL A:  AKI in setting of cardiogenic/septic shock. Hypokalemia. Hypernatremia P:   - Monitor BMET and UOP, note rising Na, may be approaching dry wt - on Lasix 40 q8h - free water started - repleted K am 2/24  GASTROINTESTINAL A:   Nutrition. Constipation. ?upper GI bleed 2/05 >> no further episodes since. P:   - Protonix > changed to qd 2/8 - Bowel regimen ordered - TF on going  HEMATOLOGIC A:  Thrombocytopenia >> likely from impella >> resolved. Anemia of critical illness. P:  - F/u CBC. - SQ heparin for DVT prevention, no evidence bleeding  INFECTIOUS A: MRSA HCAP. Febrile overnight. P:    - Vanc 2/8>>>  - Cefepime 2/17>>>2/22  - Blood 2/1 >> negative - Blood 2/08 >> negative - Sputum 2/08 >> MRSA - Blood 2/17>>> negative  D/c cefepime 2/22 and follow clinically, follow ability to wean norepi D/c vanco 2/23, completed 16 days  ENDOCRINE A:   Hyperglycemia. P:   - SSI with lantus - goal CBG 140-180  NEUROLOGIC A:  Acute encephalopathy 2nd to respiratory failure, cardiogenic shock. P:   - RASS goal to -1 on 2/22 - fentanyl gtt, goal to 50-75 - D/c versed gtt - decrease seroquel to 50 qhs  Independent CC time 35 minutes   Levy Pupaobert Donyale Falcon, MD, PhD 04/25/2014, 4:09 PM [facility] [phone] or if no answer [phone]

## 2014-04-26 DIAGNOSIS — J9601 Acute respiratory failure with hypoxia: Secondary | ICD-10-CM

## 2014-04-26 DIAGNOSIS — R6521 Severe sepsis with septic shock: Secondary | ICD-10-CM

## 2014-04-26 DIAGNOSIS — A419 Sepsis, unspecified organism: Secondary | ICD-10-CM

## 2014-04-26 DIAGNOSIS — J80 Acute respiratory distress syndrome: Secondary | ICD-10-CM

## 2014-04-26 LAB — BASIC METABOLIC PANEL
Anion gap: 10 (ref 5–15)
BUN: 116 mg/dL — AB (ref 6–23)
CALCIUM: 8 mg/dL — AB (ref 8.4–10.5)
CO2: 33 mmol/L — AB (ref 19–32)
Chloride: 107 mmol/L (ref 96–112)
Creatinine, Ser: 1.42 mg/dL — ABNORMAL HIGH (ref 0.50–1.35)
GFR calc Af Amer: 61 mL/min — ABNORMAL LOW (ref >90)
GFR, EST NON AFRICAN AMERICAN: 53 mL/min — AB (ref >90)
GLUCOSE: 121 mg/dL — AB (ref 70–99)
Potassium: 3.6 mmol/L (ref 3.5–5.1)
SODIUM: 150 mmol/L — AB (ref 135–145)

## 2014-04-26 LAB — BLOOD GAS, ARTERIAL
Acid-Base Excess: 7.2 mmol/L — ABNORMAL HIGH (ref 0.0–2.0)
Bicarbonate: 31.3 mEq/L — ABNORMAL HIGH (ref 20.0–24.0)
Drawn by: 419341
FIO2: 0.8 %
MECHVT: 520 mL
O2 Saturation: 97.2 %
PCO2 ART: 45 mmHg (ref 35.0–45.0)
PEEP: 12 cmH2O
Patient temperature: 98.6
RATE: 16 resp/min
TCO2: 32.7 mmol/L (ref 0–100)
pH, Arterial: 7.457 — ABNORMAL HIGH (ref 7.350–7.450)
pO2, Arterial: 86.6 mmHg (ref 80.0–100.0)

## 2014-04-26 LAB — GLUCOSE, CAPILLARY
GLUCOSE-CAPILLARY: 133 mg/dL — AB (ref 70–99)
Glucose-Capillary: 124 mg/dL — ABNORMAL HIGH (ref 70–99)
Glucose-Capillary: 139 mg/dL — ABNORMAL HIGH (ref 70–99)
Glucose-Capillary: 112 mg/dL — ABNORMAL HIGH (ref 70–99)
Glucose-Capillary: 117 mg/dL — ABNORMAL HIGH (ref 70–99)
Glucose-Capillary: 126 mg/dL — ABNORMAL HIGH (ref 70–99)
Glucose-Capillary: 114 mg/dL — ABNORMAL HIGH (ref 70–99)

## 2014-04-26 LAB — CBC
HCT: 27.6 % — ABNORMAL LOW (ref 39.0–52.0)
Hemoglobin: 7.9 g/dL — ABNORMAL LOW (ref 13.0–17.0)
MCH: 27.9 pg (ref 26.0–34.0)
MCHC: 28.6 g/dL — AB (ref 30.0–36.0)
MCV: 97.5 fL (ref 78.0–100.0)
Platelets: 282 10*3/uL (ref 150–400)
RBC: 2.83 MIL/uL — ABNORMAL LOW (ref 4.22–5.81)
RDW: 20.1 % — AB (ref 11.5–15.5)
WBC: 10.6 10*3/uL — ABNORMAL HIGH (ref 4.0–10.5)

## 2014-04-26 NOTE — Progress Notes (Signed)
Patient ID: Micheal Ayala, male   DOB: 06/18/1955, 59 y.o.   MRN: [id number]  Advanced Heart Failure Rounding Note   Subjective:    Impella removed 2/5.  Echo (2/7) with EF 25-30%, peri-apical akinesis, no LV thrombus noted, normal RV.   Underwent trach 2/16 due to severe MRSA PNA and ARDS.   FiO2 weaned down to 70% still with cuff leak. Levophed weaned off. SBPs in 80s.   [place] NSR on IV amio    Objective:   Weight Range:  Vital Signs:   Temp:  [97.6 F (36.4 C)-99.9 F (37.7 C)] 97.6 F (36.4 C) (02/25 0800) Pulse Rate:  [63-111] 100 (02/25 0817) Resp:  [18-39] 30 (02/25 0817) BP: (78-166)/(49-117) 87/58 mmHg (02/25 0817) SpO2:  [88 %-100 %] 99 % (02/25 0817) Arterial Line BP: (97-131)/(46-70) 97/46 mmHg (02/25 0800) FiO2 (%):  [70 %-80 %] 70 % (02/25 0817) Weight:  [101.9 kg (224 lb 10.4 oz)] 101.9 kg (224 lb 10.4 oz) (02/25 0500) Last BM Date: 04/17/14  Weight change: Filed Weights   04/24/14 0428 04/25/14 0300 04/26/14 0500  Weight: 102.3 kg (225 lb 8.5 oz) 100.5 kg (221 lb 9 oz) 101.9 kg (224 lb 10.4 oz)    Intake/Output:   Intake/Output Summary (Last 24 hours) at 04/26/14 0825 Last data filed at 04/26/14 0800  Gross per 24 hour  Intake 2342.02 ml  Output   2015 ml  Net 327.02 ml     Physical Exam: General:  On trach.  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   Cor: PMI laterally displaced. Regular  Lungs: diffuse rhonchi Abdomen: soft, nontender, + mildly distended. No hepatosplenomegaly. No bruits or masses. Hypoactive bowel sounds. Extremities: no cyanosis, clubbing, rash, 1-2+ edema. Neuro: awake on vent. agitated  Telemetry: Sinus 80-90  Labs: Basic Metabolic Panel:  Recent Labs Lab 04/20/14 0320  04/21/14 0340 04/22/14 0400 04/23/14 0400 04/24/14 0420 04/25/14 0500 04/25/14 2140 04/26/14 0358  NA 144  < > 144 143 145 148* 150* 147* 150*  K 3.6  < > 3.8 3.4* 3.2* 3.0* 2.9* 4.0  3.6  CL 102  < > 103 101 101 108 109 111 107  CO2 33*  < > 32 36* 32 37* 33* 31 33*  GLUCOSE 118*  < > 151* 168* 141* 131* 124* 141* 121*  BUN 107*  < > 104* 100* 94* 98* 106* 113* 116*  CREATININE 1.47*  < > 1.50* 1.53* 1.35 1.37* 1.34 1.46* 1.42*  CALCIUM 8.0*  < > 8.2* 7.8* 8.0* 8.0* 8.0* 7.9* 8.0*  MG 2.4  --  2.4 2.3 2.3  --  2.4  --   --   PHOS 4.0  --  3.6 4.2 3.4  --  3.0  --   --   < > = values in this interval not displayed.  Liver Function Tests:  Recent Labs Lab 04/22/14 0400 04/24/14 0420  AST 82* 78*  ALT 75* 68*  ALKPHOS 317* 227*  BILITOT 1.4* 1.1  PROT 5.9* 5.7*  ALBUMIN 1.9* 1.7*   No results for input(s): LIPASE, AMYLASE in the last 168 hours. No results for input(s): AMMONIA in the last 168 hours.  CBC:  Recent Labs Lab 04/22/14 0400 04/23/14 0400 04/24/14 0420 04/25/14 0500 04/26/14 0358  WBC 13.7* 11.2* 7.8 10.5 10.6*  NEUTROABS 11.7* 10.0*  --   --   --   HGB 8.8* 8.2* 7.7* 8.2* 7.9*  HCT 30.6* 28.0* 26.6*  28.3* 27.6*  MCV 96.2 96.2 96.4 97.3 97.5  PLT 322 267 256 318 282    Cardiac Enzymes: No results for input(s): CKTOTAL, CKMB, CKMBINDEX, TROPONINI in the last 168 hours.  BNP: BNP (last 3 results) No results for input(s): PROBNP in the last 8760 hours.   Other results:    Imaging: Dg Chest Port 1 View  04/25/2014   CLINICAL DATA:  STEMI, cardiogenic shock respiratory failure  EXAM: PORTABLE CHEST - 1 VIEW  COMPARISON:  Portable chest x-ray dated April 24, 2014  FINDINGS: There has been interval increase in the alveolar opacities bilaterally. The left heart border is nearly obscured as is the left hemidiaphragm. The pulmonary vascularity is indistinct. There is a moderate-sized right pleural effusion and smaller left pleural effusion. The tracheostomy appliance tip lies at the inferior margin of the clavicular heads approximately 5.3 cm above the carina. The feeding tube tip projects below the inferior margin of the image. The left  subclavian venous catheter tip projects over the midportion of the SVC.  IMPRESSION: Interval worsening of bilateral alveolar opacities consistent with worsening interstitial edema/ARDS.   Electronically Signed   By: David  [place]   On: 04/25/2014 07:14     Medications:     Scheduled Medications: . antiseptic oral rinse  7 mL Mouth Rinse QID  . aspirin  81 mg Per Tube Daily  . atorvastatin  80 mg Per Tube q1800  . chlorhexidine  15 mL Mouth Rinse BID  . docusate  100 mg Oral BID  . feeding supplement (PRO-STAT SUGAR FREE 64)  60 mL Per Tube TID  . feeding supplement (VITAL HIGH PROTEIN)  1,000 mL Per Tube Q24H  . free water  200 mL Per Tube 3 times per day  . furosemide  40 mg Intravenous Q8H  . heparin subcutaneous  5,000 Units Subcutaneous 3 times per day  . insulin aspart  0-15 Units Subcutaneous 6 times per day  . insulin glargine  35 Units Subcutaneous Daily  . ipratropium  0.5 mg Nebulization Q6H  . levalbuterol  0.63 mg Nebulization Q6H  . multivitamin  5 mL Per Tube Daily  . pantoprazole sodium  40 mg Per Tube Q24H  . QUEtiapine  50 mg Oral QHS  . sodium chloride  10-40 mL Intracatheter Q12H  . ticagrelor  90 mg Per Tube BID    Infusions: . sodium chloride 10 mL/hr at 04/24/14 1900  . amiodarone 30 mg/hr (04/26/14 0342)  . fentaNYL infusion INTRAVENOUS 150 mcg/hr (04/26/14 0000)  . norepinephrine (LEVOPHED) Adult infusion Stopped (04/25/14 1600)    PRN Medications: Place/Maintain arterial line **AND** sodium chloride, Place/Maintain arterial line **AND** sodium chloride, acetaminophen (TYLENOL) oral liquid 160 mg/5 mL, bisacodyl, fentaNYL, Influenza vac split quadrivalent PF, levalbuterol, ondansetron (ZOFRAN) IV, pneumococcal 23 valent vaccine, sennosides, sodium chloride, sorbitol   Assessment:   1. Cardiogenic shock    --s/p Impella placement 1/31, Impella out 2/5.  2. Acute on chronic systolic HF    --EF 10% on echo    --Repeat echo (2/7) with EF 25-30%,  peri-apical akinesis, no LV thrombus, normal RV 3. Anterolateral STEMI 1/31   --due to stent occlusion   --s/p PCI with DES to LAD and OM-2 on 1/31   --s/p PCI with DES to RCA 2/5 4. VDRF 5. CAD 6. Tobacco abuse ongoing 7. 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 Hypomagnesemia/hyponatremia 9. Anemia s/p 3u RBCs 10. SVT: Amiodarone started 11. MRSA Pneumonia, hospital acquired  12. Acute kidney injury 13. Hypernatremia  Plan/Discussion:    Main issue now is severe MRSA PNA and ARDS. S/p trach c/b air leak.   Despite trach respiratory status remains very tenuous with little progress being made despite aggressive efforts. P/CCM managing ARDS and airway.  HF currently stable. Not much to add from that standpoint. Remains on lasix but is increasingly hypernatremic despite D5W. May need to stop lasix for a day or two.. May need to restart norepi for BP support.  Continue ASA, statin, brilinta.   Continue IV amio for AF.   Prognosis increasingly concerning.   Length of Stay: 25 Arvilla Meres MD 04/26/2014, 8:25 AM  Advanced Heart Failure Team Pager [phone] (M-F; 7a - 4p)  Please contact [facility] for night-coverage after hours (4p -7a ) and weekends on [email or web address]

## 2014-04-26 NOTE — Progress Notes (Signed)
NUTRITION FOLLOW-UP  INTERVENTION:  Needs bowel regimen, discussed with RN  Vital High Protein @ 44m/hr  ProStat 60 ml TID  Provides 1440 kcals (63% of needs), 163 grams protein, 702 ml H20 Total free water: 1452 ml   NUTRITION DIAGNOSIS: Inadequate oral intake related to unable to eat as evidenced by NPO status; ongoing.   Goal: Enteral nutrition to provide 60-70% of estimated calorie needs (22-25 kcals/kg ideal body weight) and 100% of estimated protein needs, based on ASPEN guidelines for hypocaloric, high protein feeding in critically ill obese individuals; met.   Monitor:  TF tolerance, PO status, weight, labs    ASSESSMENT: 59[place], was presented to ED via EMS with severe chest pain, hypertension and shortness of breath. He underwent emergency left and right heart catheterization and placement of a Impella CP ventricular assist device via femoral artery, removed 2/5. Occluded prior stents have been replaced with new stents.   Patient is currently intubated on ventilator support MV: 13.9 L/min Temp (24hrs), Avg:98.6 F (37 C), Min:97.4 F (36.3 C), Max:99.9 F (37.7 C)  Sodium elevated Trach placed 2/19 Pt is on colace BID and has dulcolax ordered daily prn. Pt with diarrhea 2/16.   250 ml H2O every 8 hours  Height: Ht Readings from Last 1 Encounters:  04/11/14 5' 11" (1.803 m)    Weight: Wt Readings from Last 1 Encounters:  04/26/14 224 lb 10.4 oz (101.9 kg)  Admission weight: 218 lb (99.1 kg) 1/31 Pt positive 8 L since admission   BMI:  Body mass index is 31.35 kg/(m^2). obese  Estimated Nutritional Needs: Kcal: 2277 Protein: 155-175 grams Fluid: 1.8 L daily  Skin: intact  Diet Order: Diet NPO time specified   Intake/Output Summary (Last 24 hours) at 04/26/14 1554 Last data filed at 04/26/14 1500  Gross per 24 hour  Intake 2267.7 ml  Output   2590 ml  Net -322.3 ml    Last BM:  2/16  Labs:   Recent Labs Lab 04/22/14 0400 04/23/14 0400  04/25/14 0500 04/25/14 2140 04/26/14 0358  NA 143 145  < > 150* 147* 150*  K 3.4* 3.2*  < > 2.9* 4.0 3.6  CL 101 101  < > 109 111 107  CO2 36* 32  < > 33* 31 33*  BUN 100* 94*  < > 106* 113* 116*  CREATININE 1.53* 1.35  < > 1.34 1.46* 1.42*  CALCIUM 7.8* 8.0*  < > 8.0* 7.9* 8.0*  MG 2.3 2.3  --  2.4  --   --   PHOS 4.2 3.4  --  3.0  --   --   GLUCOSE 168* 141*  < > 124* 141* 121*  < > = values in this interval not displayed.  CBG (last 3)   Recent Labs  04/26/14 0035 04/26/14 0414 04/26/14 0751  GLUCAP 139* 112* 117*    Scheduled Meds: . antiseptic oral rinse  7 mL Mouth Rinse QID  . aspirin  81 mg Per Tube Daily  . atorvastatin  80 mg Per Tube q1800  . chlorhexidine  15 mL Mouth Rinse BID  . docusate  100 mg Oral BID  . feeding supplement (PRO-STAT SUGAR FREE 64)  60 mL Per Tube TID  . feeding supplement (VITAL HIGH PROTEIN)  1,000 mL Per Tube Q24H  . free water  200 mL Per Tube 3 times per day  . furosemide  40 mg Intravenous Q8H  .  heparin subcutaneous  5,000 Units Subcutaneous 3 times per day  . insulin aspart  0-15 Units Subcutaneous 6 times per day  . insulin glargine  35 Units Subcutaneous Daily  . ipratropium  0.5 mg Nebulization Q6H  . levalbuterol  0.63 mg Nebulization Q6H  . multivitamin  5 mL Per Tube Daily  . pantoprazole sodium  40 mg Per Tube Q24H  . QUEtiapine  50 mg Oral QHS  . sodium chloride  10-40 mL Intracatheter Q12H  . ticagrelor  90 mg Per Tube BID    Continuous Infusions: . sodium chloride 10 mL/hr at 04/26/14 0939  . amiodarone 30 mg/hr (04/26/14 1436)  . fentaNYL infusion INTRAVENOUS 150 mcg/hr (04/26/14 0000)  . norepinephrine (LEVOPHED) Adult infusion Stopped (04/25/14 1600)   [place], [place], CNSC [phone] Pager [phone] After Hours Pager

## 2014-04-26 NOTE — Progress Notes (Signed)
Patient's spO2 decreased to 80% and HR brady down. RT bagged masked Patient until spO2 and HR was stable to put back on the vent. RN at bedside. Patients continues to have cuff leak, MD notified. Recruitment maneuver was performed and spO2 remains in the 90's at this time. RT will continue to monitor.

## 2014-04-26 NOTE — Progress Notes (Signed)
PULMONARY / CRITICAL CARE MEDICINE   Name: Micheal Ayala MRN: [id number] DOB: 04/12/1955    ADMISSION DATE:  2014/04/04 CONSULTATION DATE:  Apr 04, 2014  REFERRING MD :  Dr. Herbie Baltimore  CHIEF COMPLAINT:  STEMI  INITIAL PRESENTATION:  59 yo smoker with STEMI, cardiogenic shock, VDRF.  He is visiting [place] from [place].  STUDIES:  1/31 Cardiac cath >> severe CAD with occlusion of LAD, circumflex, RCS, ischemic CM >> DES to LAD, circumflex 1/31 Echo >> LVEF 20-25%, severely reduced systolic function 2/04 LHC > DES RCA 2/06 Echo >> EF 25 to 30%, mod LVH, grade 2 diastolic dysfx  SIGNIFICANT EVENTS: 1/31 admit, cardiac cath, impella 2/01 TCTS consulted, transfuse 1 unit PRBC 2/05 impella removed; ?upper GI bleeding  2/08 increased respiratory secretions 2/10 ARDS protocol, added levophed 2/11 start paralytic 2/12 - tried off nimbex, liberated to 7cc/kg  2/19- trach placed 2/20- trach huge cuff leak, desaturation, emergent ETT then retrach 2/21- pressors increased  SUBJECTIVE:  No significant changes.  Remains on PEEP 12  VITAL SIGNS: Temp:  [97.4 F (36.3 C)-99.9 F (37.7 C)] 97.4 F (36.3 C) (02/25 1200) Pulse Rate:  [63-111] 89 (02/25 1500) Resp:  [18-39] 35 (02/25 1500) BP: (78-166)/(49-117) 87/52 mmHg (02/25 1500) SpO2:  [88 %-100 %] 97 % (02/25 1500) Arterial Line BP: (97-131)/(46-70) 109/58 mmHg (02/25 1500) FiO2 (%):  [70 %-80 %] 70 % (02/25 1500) Weight:  [101.9 kg (224 lb 10.4 oz)] 101.9 kg (224 lb 10.4 oz) (02/25 0500) HEMODYNAMICS:   VENTILATOR SETTINGS: Vent Mode:  [-] PRVC FiO2 (%):  [70 %-80 %] 70 % Set Rate:  [16 bmp] 16 bmp Vt Set:  [520 mL] 520 mL PEEP:  [12 cmH20] 12 cmH20 Plateau Pressure:  [30 cmH20-38 cmH20] 34 cmH20 INTAKE / OUTPUT:  Intake/Output Summary (Last 24 hours) at 04/26/14 1552 Last data filed at 04/26/14 1500  Gross per 24 hour  Intake 2267.7 ml  Output   2590 ml  Net -322.3 ml   PHYSICAL EXAMINATION:  Gen: sedated,  comfortable HEENT: Rt eye enucleated PULM: B coarse BS, B crackles CV: regular, 100's, no M AB: decreased bowel sounds, soft, no r/g Ext: 2+ BLE edema  Neuro: Moves with stim, pain. Does not follow commands am 2/23  LABS:  CBC  Recent Labs Lab 04/24/14 0420 04/25/14 0500 04/26/14 0358  WBC 7.8 10.5 10.6*  HGB 7.7* 8.2* 7.9*  HCT 26.6* 28.3* 27.6*  PLT 256 318 282   Coag's  Recent Labs Lab 04/24/14 0420  INR 1.18   BMET  Recent Labs Lab 04/25/14 0500 04/25/14 2140 04/26/14 0358  NA 150* 147* 150*  K 2.9* 4.0 3.6  CL 109 111 107  CO2 33* 31 33*  BUN 106* 113* 116*  CREATININE 1.34 1.46* 1.42*  GLUCOSE 124* 141* 121*   Electrolytes  Recent Labs Lab 04/22/14 0400 04/23/14 0400  04/25/14 0500 04/25/14 2140 04/26/14 0358  CALCIUM 7.8* 8.0*  < > 8.0* 7.9* 8.0*  MG 2.3 2.3  --  2.4  --   --   PHOS 4.2 3.4  --  3.0  --   --   < > = values in this interval not displayed. ABG  Recent Labs Lab 04/23/14 0354 04/24/14 0537 04/26/14 0500  PHART 7.474* 7.431 7.457*  PCO2ART 45.4* 49.6* 45.0  PO2ART 90.5 127.0* 86.6   Glucose  Recent Labs Lab 04/25/14 1217 04/25/14 1725 04/25/14 2143 04/26/14 0035 04/26/14 0414 04/26/14 0751  GLUCAP 180* 151* 124* 139* 112* 117*  PCXR 2/22 > no change B alveolar infiltrates, trach well positioned  ASSESSMENT / PLAN:  PULMONARY ETT 1/31>>>2/19 Trach [name] 2/19>>>re do 2/20>>> A:  Acute respiratory failure due to cardiogenic shock and pulmonary edema.   Superimposed MRSA HCAP +  severe ARDS. Very little progress being made re vent needs Tobacco abuse. 2/20 emergent re-trach from cuff leak  P:   - Cont ARDS protocol, keep P plat less 30  - with leak concerns would like to lower peep, goal 10 but he has not tolerated this - will need an extra long 6 placed, will try to do on 2/26 - neg balance as able, + 8.8 L for hospitalization   CARDIOVASCULAR L Impella 1/31>>>2/5 Lt [place] CVL 2/08 >> A:  Cardiogenic  shock post STEMI on admission - echo 2/7 EF 25-30%.  STEMI r/t stent occlusion, now s/p DES to LAD and RCA Septic shock developed from MRSA PNA 2/10. SVT, A fib with RVR. P: - Amiodarone, brilinta per cardiology - Continue ASA, lipitor. Digoxin stopped - continue to wean norepi, currently for goal MAP > 60.   RENAL A:  AKI in setting of cardiogenic/septic shock. Hypokalemia. Hypernatremia P:   - Monitor BMET and UOP - on Lasix 40 q8h - free water - repleted K  GASTROINTESTINAL A:   Nutrition. Constipation. ?upper GI bleed 2/05 >> no further episodes since. P:   - Protonix > changed to qd 2/8 - Bowel regimen ordered - TF on going  HEMATOLOGIC A:  Thrombocytopenia >> likely from impella >> resolved. Anemia of critical illness. P:  - F/u CBC. - SQ heparin for DVT prevention, no evidence bleeding  INFECTIOUS A: MRSA HCAP. Febrile overnight. P:    - Vanc 2/8>>>  - Cefepime 2/17>>>2/22  - Blood 2/1 >> negative - Blood 2/08 >> negative - Sputum 2/08 >> MRSA - Blood 2/17>>> negative  D/c cefepime 2/22 and follow clinically, follow ability to wean norepi D/c vanco 2/23, completed 16 days  ENDOCRINE A:   Hyperglycemia. P:   - SSI with lantus - goal CBG 140-180  NEUROLOGIC A:  Acute encephalopathy 2nd to respiratory failure, cardiogenic shock. P:   - RASS goal to -1 on 2/22 - fentanyl gtt, goal to 50-75 - D/c versed gtt - decrease seroquel to 50 qhs  Independent CC time 35 minutes   Levy Pupaobert Earl Zellmer, MD, PhD 04/26/2014, 3:52 PM [facility] [phone] or if no answer [phone]

## 2014-04-27 ENCOUNTER — Inpatient Hospital Stay (HOSPITAL_COMMUNITY): Payer: Medicare Other

## 2014-04-27 DIAGNOSIS — Z930 Tracheostomy status: Secondary | ICD-10-CM

## 2014-04-27 DIAGNOSIS — J189 Pneumonia, unspecified organism: Secondary | ICD-10-CM

## 2014-04-27 LAB — GLUCOSE, CAPILLARY
GLUCOSE-CAPILLARY: 122 mg/dL — AB (ref 70–99)
Glucose-Capillary: 140 mg/dL — ABNORMAL HIGH (ref 70–99)
Glucose-Capillary: 148 mg/dL — ABNORMAL HIGH (ref 70–99)
Glucose-Capillary: 159 mg/dL — ABNORMAL HIGH (ref 70–99)
Glucose-Capillary: 172 mg/dL — ABNORMAL HIGH (ref 70–99)
Glucose-Capillary: 186 mg/dL — ABNORMAL HIGH (ref 70–99)

## 2014-04-27 LAB — BASIC METABOLIC PANEL
ANION GAP: 10 (ref 5–15)
Anion gap: 12 (ref 5–15)
BUN: 111 mg/dL — AB (ref 6–23)
BUN: 118 mg/dL — ABNORMAL HIGH (ref 6–23)
CALCIUM: 8.1 mg/dL — AB (ref 8.4–10.5)
CALCIUM: 7.9 mg/dL — AB (ref 8.4–10.5)
CHLORIDE: 110 mmol/L (ref 96–112)
CO2: 32 mmol/L (ref 19–32)
CO2: 32 mmol/L (ref 19–32)
CREATININE: 1.36 mg/dL — AB (ref 0.50–1.35)
Chloride: 110 mmol/L (ref 96–112)
Creatinine, Ser: 1.27 mg/dL (ref 0.50–1.35)
GFR calc Af Amer: 70 mL/min — ABNORMAL LOW (ref >90)
GFR calc Af Amer: 65 mL/min — ABNORMAL LOW (ref >90)
GFR calc non Af Amer: 56 mL/min — ABNORMAL LOW (ref >90)
GFR, EST NON AFRICAN AMERICAN: 61 mL/min — AB (ref >90)
GLUCOSE: 124 mg/dL — AB (ref 70–99)
Glucose, Bld: 156 mg/dL — ABNORMAL HIGH (ref 70–99)
Potassium: 2.4 mmol/L — CL (ref 3.5–5.1)
Potassium: 3.5 mmol/L (ref 3.5–5.1)
SODIUM: 154 mmol/L — AB (ref 135–145)
Sodium: 152 mmol/L — ABNORMAL HIGH (ref 135–145)

## 2014-04-27 LAB — CBC
HCT: 27.4 % — ABNORMAL LOW (ref 39.0–52.0)
Hemoglobin: 7.6 g/dL — ABNORMAL LOW (ref 13.0–17.0)
MCH: 27.3 pg (ref 26.0–34.0)
MCHC: 27.7 g/dL — AB (ref 30.0–36.0)
MCV: 98.6 fL (ref 78.0–100.0)
PLATELETS: 257 10*3/uL (ref 150–400)
RBC: 2.78 MIL/uL — ABNORMAL LOW (ref 4.22–5.81)
RDW: 20.2 % — AB (ref 11.5–15.5)
WBC: 12.3 10*3/uL — ABNORMAL HIGH (ref 4.0–10.5)

## 2014-04-27 LAB — PHOSPHORUS: Phosphorus: 3.5 mg/dL (ref 2.3–4.6)

## 2014-04-27 LAB — MAGNESIUM: MAGNESIUM: 2.5 mg/dL (ref 1.5–2.5)

## 2014-04-27 MED ORDER — ETOMIDATE 2 MG/ML IV SOLN
INTRAVENOUS | Status: AC
  Filled 2014-04-27: qty 20

## 2014-04-27 MED ORDER — DEXTROSE 5 % IV SOLN
INTRAVENOUS | Status: DC
  Administered 2014-04-27: 125 mL via INTRAVENOUS
  Administered 2014-04-27 – 2014-04-29 (×4): via INTRAVENOUS

## 2014-04-27 MED ORDER — FREE WATER
300.0000 mL | Freq: Four times a day (QID) | Status: DC
  Administered 2014-04-27 – 2014-04-30 (×11): 300 mL

## 2014-04-27 MED ORDER — MIDAZOLAM HCL 2 MG/2ML IJ SOLN
INTRAMUSCULAR | Status: AC
  Filled 2014-04-27: qty 2

## 2014-04-27 MED ORDER — ROCURONIUM BROMIDE 50 MG/5ML IV SOLN
INTRAVENOUS | Status: AC
  Filled 2014-04-27: qty 2

## 2014-04-27 MED ORDER — SUCCINYLCHOLINE CHLORIDE 20 MG/ML IJ SOLN
INTRAMUSCULAR | Status: AC
  Filled 2014-04-27: qty 1

## 2014-04-27 MED ORDER — LIDOCAINE HCL (CARDIAC) 20 MG/ML IV SOLN
INTRAVENOUS | Status: AC
  Filled 2014-04-27: qty 5

## 2014-04-27 MED ORDER — POTASSIUM CHLORIDE 20 MEQ/15ML (10%) PO SOLN
40.0000 meq | ORAL | Status: AC
  Administered 2014-04-27 (×3): 40 meq
  Filled 2014-04-27 (×3): qty 30

## 2014-04-27 NOTE — Progress Notes (Signed)
Patient ID: Micheal Ayala, male   DOB: August 13, 1955, 59 y.o.   MRN: [id number]  Advanced Heart Failure Rounding Note   Subjective:    Impella removed 2/5.  Echo (2/7) with EF 25-30%, peri-apical akinesis, no LV thrombus noted, normal RV.   Underwent trach 2/16 due to severe MRSA PNA and ARDS.   Remains on 70% still with cuff leak. Extra-long trach to be placed today. Lasix stopped due to Na 154. Off pressors.SBPs in 80-90s.   Remians NSR on IV amio    Objective:   Weight Range:  Vital Signs:   Temp:  [97.4 F (36.3 C)-99.1 F (37.3 C)] 98.7 F (37.1 C) (02/26 0600) Pulse Rate:  [81-102] 86 (02/26 0600) Resp:  [17-38] 25 (02/26 0600) BP: (82-110)/(50-77) 106/63 mmHg (02/26 0600) SpO2:  [89 %-100 %] 97 % (02/26 0600) Arterial Line BP: (91-126)/(45-65) 125/65 mmHg (02/26 0600) FiO2 (%):  [70 %-80 %] 70 % (02/26 0400) Weight:  [101.1 kg (222 lb 14.2 oz)] 101.1 kg (222 lb 14.2 oz) (02/26 0419) Last BM Date: 04/17/14  Weight change: Filed Weights   04/25/14 0300 04/26/14 0500 04/27/14 0419  Weight: 100.5 kg (221 lb 9 oz) 101.9 kg (224 lb 10.4 oz) 101.1 kg (222 lb 14.2 oz)    Intake/Output:   Intake/Output Summary (Last 24 hours) at 04/27/14 0701 Last data filed at 04/27/14 0600  Gross per 24 hour  Intake 1764.1 ml  Output   3680 ml  Net -1915.9 ml     Physical Exam: General:  On trach.  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   Cor: PMI laterally displaced. Regular  Lungs: diffuse rhonchi Abdomen: soft, nontender, + mildly distended. No hepatosplenomegaly. No bruits or masses. Hypoactive bowel sounds. Extremities: no cyanosis, clubbing, rash, 1-2+ edema. Neuro: awake on vent. agitated  Telemetry: Sinus 80-90  Labs: Basic Metabolic Panel:  Recent Labs Lab 04/21/14 0340 04/22/14 0400 04/23/14 0400 04/24/14 0420 04/25/14 0500 04/25/14 2140 04/26/14 0358 04/27/14 0500  NA 144 143 145 148* 150* 147* 150*  154*  K 3.8 3.4* 3.2* 3.0* 2.9* 4.0 3.6 2.4*  CL 103 101 101 108 109 111 107 110  CO2 32 36* 32 37* 33* 31 33* 32  GLUCOSE 151* 168* 141* 131* 124* 141* 121* 124*  BUN 104* 100* 94* 98* 106* 113* 116* 111*  CREATININE 1.50* 1.53* 1.35 1.37* 1.34 1.46* 1.42* 1.27  CALCIUM 8.2* 7.8* 8.0* 8.0* 8.0* 7.9* 8.0* 8.1*  MG 2.4 2.3 2.3  --  2.4  --   --  2.5  PHOS 3.6 4.2 3.4  --  3.0  --   --  3.5    Liver Function Tests:  Recent Labs Lab 04/22/14 0400 04/24/14 0420  AST 82* 78*  ALT 75* 68*  ALKPHOS 317* 227*  BILITOT 1.4* 1.1  PROT 5.9* 5.7*  ALBUMIN 1.9* 1.7*   No results for input(s): LIPASE, AMYLASE in the last 168 hours. No results for input(s): AMMONIA in the last 168 hours.  CBC:  Recent Labs Lab 04/22/14 0400 04/23/14 0400 04/24/14 0420 04/25/14 0500 04/26/14 0358 04/27/14 0500  WBC 13.7* 11.2* 7.8 10.5 10.6* 12.3*  NEUTROABS 11.7* 10.0*  --   --   --   --   HGB 8.8* 8.2* 7.7* 8.2* 7.9* 7.6*  HCT 30.6* 28.0* 26.6* 28.3* 27.6* 27.4*  MCV 96.2 96.2 96.4 97.3 97.5 98.6  PLT 322 267 256 318 282 257    Cardiac Enzymes:  No results for input(s): CKTOTAL, CKMB, CKMBINDEX, TROPONINI in the last 168 hours.  BNP: BNP (last 3 results) No results for input(s): PROBNP in the last 8760 hours.   Other results:    Imaging: No results found.   Medications:     Scheduled Medications: . antiseptic oral rinse  7 mL Mouth Rinse QID  . aspirin  81 mg Per Tube Daily  . atorvastatin  80 mg Per Tube q1800  . chlorhexidine  15 mL Mouth Rinse BID  . docusate  100 mg Oral BID  . feeding supplement (PRO-STAT SUGAR FREE 64)  60 mL Per Tube TID  . feeding supplement (VITAL HIGH PROTEIN)  1,000 mL Per Tube Q24H  . free water  200 mL Per Tube 3 times per day  . heparin subcutaneous  5,000 Units Subcutaneous 3 times per day  . insulin aspart  0-15 Units Subcutaneous 6 times per day  . insulin glargine  35 Units Subcutaneous Daily  . ipratropium  0.5 mg Nebulization Q6H  .  levalbuterol  0.63 mg Nebulization Q6H  . multivitamin  5 mL Per Tube Daily  . pantoprazole sodium  40 mg Per Tube Q24H  . potassium chloride  40 mEq Per Tube Q4H  . QUEtiapine  50 mg Oral QHS  . sodium chloride  10-40 mL Intracatheter Q12H  . ticagrelor  90 mg Per Tube BID    Infusions: . sodium chloride 10 mL/hr at 04/26/14 2000  . amiodarone 30 mg/hr (04/27/14 0355)  . dextrose 125 mL (04/27/14 0641)  . fentaNYL infusion INTRAVENOUS 150 mcg/hr (04/27/14 0257)  . norepinephrine (LEVOPHED) Adult infusion Stopped (04/25/14 1600)    PRN Medications: Place/Maintain arterial line **AND** sodium chloride, Place/Maintain arterial line **AND** sodium chloride, acetaminophen (TYLENOL) oral liquid 160 mg/5 mL, bisacodyl, fentaNYL, Influenza vac split quadrivalent PF, levalbuterol, ondansetron (ZOFRAN) IV, pneumococcal 23 valent vaccine, sennosides, sodium chloride, sorbitol   Assessment:   1. Cardiogenic shock    --s/p Impella placement 1/31, Impella out 2/5.  2. Acute on chronic systolic HF    --EF 10% on echo    --Repeat echo (2/7) with EF 25-30%, peri-apical akinesis, no LV thrombus, normal RV 3. Anterolateral STEMI 1/31   --due to stent occlusion   --s/p PCI with DES to LAD and OM-2 on 1/31   --s/p PCI with DES to RCA 2/5 4. VDRF 5. CAD 6. Tobacco abuse ongoing 7. 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 Hypomagnesemia/hyponatremia 9. Anemia s/p 3u RBCs 10. SVT: Amiodarone started 11. MRSA Pneumonia, hospital acquired 12. Acute kidney injury 13. Hypernatremia  Plan/Discussion:    Main issue continues to be respiratory failure due to severe MRSA PNA and ARDS. S/p trach c/b air leak. Despite trach, respiratory status remains very tenuous with little progress being made despite aggressive efforts. P/CCM managing ARDS and airway. Planning change to extra-long trach today  HF currently stable. Not much to add from that standpoint. Lasix stopped now on D5 for  hypernatremia.  Continue ASA, statin, brilinta. BP too low for ACE/b-blocker.   Continue IV amio for AF.   Prognosis increasingly concerning.   Length of Stay: [phone] Glynn Freas MD 04/27/2014, 7:01 AM  Advanced Heart Failure Team Pager [phone] (M-F; 7a - 4p)  Please contact [facility] for night-coverage after hours (4p -7a ) and weekends on [email or web address]

## 2014-04-27 NOTE — Progress Notes (Signed)
eLink Physician-Brief Progress Note Patient Name: Micheal Ayala DOB: 08/15/1955 MRN: [id number]   Date of Service  04/27/2014  HPI/Events of Note  Na 154 K 2.4  eICU Interventions  1) K+ repleted 2) D5W 3) DC furosemide until electrolytes can be corrected     Intervention Category Major Interventions: Electrolyte abnormality - evaluation and management  Micheal Ayala 04/27/2014, 6:28 AM

## 2014-04-27 NOTE — Progress Notes (Signed)
CRITICAL VALUE ALERT  Critical value received:  K+ 2.4  Date of notification:  04/27/14  Time of notification: 0610  Critical value read back:Yes.    Nurse who received alert:  Daine Florasyan Zohra Clavel  MD notified (1st page): E-Link MD  Time of first page:  0615  Responding MD:  E-Link MD  Time MD responded: [phone]

## 2014-04-27 NOTE — Progress Notes (Signed)
eLink Physician-Brief Progress Note Patient Name: Job FoundsFrederick Bouyer DOB: 12/27/1955 MRN: [id number]   Date of Service  04/27/2014  HPI/Events of Note   aline non functional.  Not on pressors.  eICU Interventions  Will have aline removed.     Intervention Category Major Interventions: Other:  Lounette Sloan 04/27/2014, 8:44 PM

## 2014-04-27 NOTE — Progress Notes (Signed)
PULMONARY / CRITICAL CARE MEDICINE   Name: Micheal Ayala MRN: [id number] DOB: 1955/10/25    ADMISSION DATE:  03/11/2014 CONSULTATION DATE:  03/08/2014  REFERRING MD :  Dr. Herbie Baltimore  CHIEF COMPLAINT:  STEMI  INITIAL PRESENTATION:  59 yo smoker with STEMI, cardiogenic shock, VDRF.  He is visiting [place] from [place].  STUDIES:  1/31 Cardiac cath >> severe CAD with occlusion of LAD, circumflex, RCS, ischemic CM >> DES to LAD, circumflex 1/31 Echo >> LVEF 20-25%, severely reduced systolic function 2/04 LHC > DES RCA 2/06 Echo >> EF 25 to 30%, mod LVH, grade 2 diastolic dysfx  SIGNIFICANT EVENTS: 1/31 admit, cardiac cath, impella 2/01 TCTS consulted, transfuse 1 unit PRBC 2/05 impella removed; ?upper GI bleeding  2/08 increased respiratory secretions 2/10 ARDS protocol, added levophed 2/11 start paralytic 2/12 - tried off nimbex, liberated to 7cc/kg  2/19- trach placed 2/20- trach huge cuff leak, desaturation, emergent ETT then retrach 2/21- pressors increased  SUBJECTIVE:  PEEP to 10, tolerating No cuff leak at this time.,   VITAL SIGNS: Temp:  [97.4 F (36.3 C)-99.1 F (37.3 C)] 98.7 F (37.1 C) (02/26 1228) Pulse Rate:  [81-102] 99 (02/26 1300) Resp:  [17-36] 29 (02/26 1300) BP: (81-118)/(50-77) 91/57 mmHg (02/26 1300) SpO2:  [89 %-99 %] 97 % (02/26 1300) Arterial Line BP: (65-126)/(45-80) 65/59 mmHg (02/26 1300) FiO2 (%):  [50 %-100 %] 80 % (02/26 1235) Weight:  [101.1 kg (222 lb 14.2 oz)] 101.1 kg (222 lb 14.2 oz) (02/26 0419) HEMODYNAMICS:   VENTILATOR SETTINGS: Vent Mode:  [-] PRVC FiO2 (%):  [50 %-100 %] 80 % Set Rate:  [16 bmp] 16 bmp Vt Set:  [520 mL] 520 mL PEEP:  [10 cmH20-14 cmH20] 12 cmH20 Plateau Pressure:  [25 cmH20-30 cmH20] 28 cmH20 INTAKE / OUTPUT:  Intake/Output Summary (Last 24 hours) at 04/27/14 1355 Last data filed at 04/27/14 1300  Gross per 24 hour  Intake 2655.38 ml  Output   3680 ml  Net -1024.62 ml   PHYSICAL EXAMINATION:  Gen:  sedated, comfortable HEENT: Rt eye enucleated PULM: B coarse BS, B crackles CV: regular, 100's, no M AB: decreased bowel sounds, soft, no r/g Ext: 2+ BLE edema  Neuro: Moves with stim, pain. Does not follow commands   LABS:  CBC  Recent Labs Lab 04/25/14 0500 04/26/14 0358 04/27/14 0500  WBC 10.5 10.6* 12.3*  HGB 8.2* 7.9* 7.6*  HCT 28.3* 27.6* 27.4*  PLT 318 282 257   Coag's  Recent Labs Lab 04/24/14 0420  INR 1.18   BMET  Recent Labs Lab 04/25/14 2140 04/26/14 0358 04/27/14 0500  NA 147* 150* 154*  K 4.0 3.6 2.4*  CL 111 107 110  CO2 31 33* 32  BUN 113* 116* 111*  CREATININE 1.46* 1.42* 1.27  GLUCOSE 141* 121* 124*   Electrolytes  Recent Labs Lab 04/23/14 0400  04/25/14 0500 04/25/14 2140 04/26/14 0358 04/27/14 0500  CALCIUM 8.0*  < > 8.0* 7.9* 8.0* 8.1*  MG 2.3  --  2.4  --   --  2.5  PHOS 3.4  --  3.0  --   --  3.5  < > = values in this interval not displayed. ABG  Recent Labs Lab 04/23/14 0354 04/24/14 0537 04/26/14 0500  PHART 7.474* 7.431 7.457*  PCO2ART 45.4* 49.6* 45.0  PO2ART 90.5 127.0* 86.6   Glucose  Recent Labs Lab 04/26/14 1715 04/26/14 1950 04/26/14 2358 04/27/14 0448 04/27/14 0802 04/27/14 1316  GLUCAP 133* 114* 159* 122* 148*  172*   PCXR 2/22 > no change B alveolar infiltrates, trach well positioned  ASSESSMENT / PLAN:  PULMONARY ETT 1/31>>>2/19 Trach [name] 2/19>>>re do 2/20>>> A:  Acute respiratory failure due to cardiogenic shock and pulmonary edema.   Superimposed MRSA HCAP +  severe ARDS. Very little progress being made re vent needs Tobacco abuse. 2/20 emergent re-trach from cuff leak  P:   - Cont ARDS protocol, keep P plat less 30  - with leak concerns would like to lower peep, currently on 10 - will need an extra long 6 placed, will try to do on 2/26 - neg balance as able, + 8.8 L for hospitalization   CARDIOVASCULAR L Impella 1/31>>>2/5 [name] CVL 2/08 >> A:  Cardiogenic shock post STEMI on  admission - echo 2/7 EF 25-30%.  STEMI r/t stent occlusion, now s/p DES to LAD and RCA Septic shock developed from MRSA PNA 2/10. SVT, A fib with RVR. P: - Amiodarone, brilinta per cardiology - Continue ASA, lipitor. Digoxin stopped - continue to wean norepi, currently for goal MAP > 60.   RENAL A:  AKI in setting of cardiogenic/septic shock. Hypokalemia. Hypernatremia P:   - Monitor BMET and UOP - on Lasix 40 q8h > stopped 2/26 due to hypernatremia - increased free water - repleted K  GASTROINTESTINAL A:   Nutrition. Constipation. ?upper GI bleed 2/05 >> no further episodes since. P:   - Protonix > changed to qd 2/8 - Bowel regimen ordered - TF on going  HEMATOLOGIC A:  Thrombocytopenia >> likely from impella >> resolved. Anemia of critical illness. P:  - F/u CBC. - SQ heparin for DVT prevention, no evidence bleeding  INFECTIOUS A: MRSA HCAP. Febrile overnight. P:    - Vanc 2/8>>>  - Cefepime 2/17>>>2/22  - Blood 2/1 >> negative - Blood 2/08 >> negative - Sputum 2/08 >> MRSA - Blood 2/17>>> negative  D/c cefepime 2/22 and follow clinically, follow ability to wean norepi D/c vanco 2/23, completed 16 days  ENDOCRINE A:   Hyperglycemia. P:   - SSI with lantus - goal CBG 140-180  NEUROLOGIC A:  Acute encephalopathy 2nd to respiratory failure, cardiogenic shock. P:   - RASS goal to -1 on 2/22 - fentanyl gtt, goal to 50-75 - D/c versed gtt - seroquel to 50 qhs  Independent CC time 35 minutes   Levy Pupaobert Byrum, MD, PhD 04/27/2014, 1:55 PM [facility] [phone] or if no answer [phone]

## 2014-04-27 NOTE — Procedures (Signed)
First Trach Change  Patient needs an extra long tracheostomy.  Patient is sedated and unresponsive.  Sutures removed.  Inner cannula removed and balloon deflated.  Tube changer placed.  Tracheostomy removed.  Size 6 cuffed proximal extra long trach placed.  Balloon inflated.  Good color change and volume return.  Patient tolerated the procedure well without complications.  CXR ordered and pending.  Alyson ReedyWesam G. Estiben Mizuno, M.D. [facility]. Pager: [phone]. After hours pager: [phone].

## 2014-04-27 NOTE — Progress Notes (Signed)
Patient unable to move any air bilaterally. Patient bagged and lavaged for about 10 breaths and suctioned obtaining thick tan, pink tinged plug. Sats up to 98 % following suctioning.

## 2014-04-28 ENCOUNTER — Inpatient Hospital Stay (HOSPITAL_COMMUNITY): Payer: Medicare Other

## 2014-04-28 LAB — GLUCOSE, CAPILLARY
GLUCOSE-CAPILLARY: 159 mg/dL — AB (ref 70–99)
GLUCOSE-CAPILLARY: 165 mg/dL — AB (ref 70–99)
Glucose-Capillary: 136 mg/dL — ABNORMAL HIGH (ref 70–99)
Glucose-Capillary: 145 mg/dL — ABNORMAL HIGH (ref 70–99)
Glucose-Capillary: 151 mg/dL — ABNORMAL HIGH (ref 70–99)
Glucose-Capillary: 166 mg/dL — ABNORMAL HIGH (ref 70–99)

## 2014-04-28 LAB — MAGNESIUM: Magnesium: 2.5 mg/dL (ref 1.5–2.5)

## 2014-04-28 LAB — CBC
HEMATOCRIT: 27.6 % — AB (ref 39.0–52.0)
HEMOGLOBIN: 7.7 g/dL — AB (ref 13.0–17.0)
MCH: 28 pg (ref 26.0–34.0)
MCHC: 27.9 g/dL — ABNORMAL LOW (ref 30.0–36.0)
MCV: 100.4 fL — AB (ref 78.0–100.0)
Platelets: 240 10*3/uL (ref 150–400)
RBC: 2.75 MIL/uL — ABNORMAL LOW (ref 4.22–5.81)
RDW: 20.8 % — ABNORMAL HIGH (ref 11.5–15.5)
WBC: 14 10*3/uL — ABNORMAL HIGH (ref 4.0–10.5)

## 2014-04-28 LAB — BASIC METABOLIC PANEL
ANION GAP: 8 (ref 5–15)
BUN: 119 mg/dL — ABNORMAL HIGH (ref 6–23)
CALCIUM: 7.7 mg/dL — AB (ref 8.4–10.5)
CO2: 32 mmol/L (ref 19–32)
Chloride: 109 mmol/L (ref 96–112)
Creatinine, Ser: 1.34 mg/dL (ref 0.50–1.35)
GFR calc Af Amer: 66 mL/min — ABNORMAL LOW (ref >90)
GFR, EST NON AFRICAN AMERICAN: 57 mL/min — AB (ref >90)
Glucose, Bld: 168 mg/dL — ABNORMAL HIGH (ref 70–99)
Potassium: 3.3 mmol/L — ABNORMAL LOW (ref 3.5–5.1)
SODIUM: 149 mmol/L — AB (ref 135–145)

## 2014-04-28 LAB — PREPARE RBC (CROSSMATCH)

## 2014-04-28 LAB — PHOSPHORUS: Phosphorus: 3.6 mg/dL (ref 2.3–4.6)

## 2014-04-28 MED ORDER — POTASSIUM CHLORIDE 20 MEQ/15ML (10%) PO SOLN
40.0000 meq | Freq: Once | ORAL | Status: AC
  Administered 2014-04-28: 40 meq
  Filled 2014-04-28 (×2): qty 30

## 2014-04-28 MED ORDER — SODIUM CHLORIDE 0.9 % IJ SOLN: 10.0000 mL | INTRAMUSCULAR | Status: DC | PRN

## 2014-04-28 MED ORDER — SODIUM CHLORIDE 0.9 % IV SOLN
Freq: Once | INTRAVENOUS | Status: AC
  Administered 2014-04-28: 14:00:00 via INTRAVENOUS

## 2014-04-28 MED ORDER — AMIODARONE HCL 200 MG PO TABS
200.0000 mg | ORAL_TABLET | Freq: Two times a day (BID) | ORAL | Status: DC
  Administered 2014-04-28 – 2014-04-29 (×4): 200 mg via ORAL
  Filled 2014-04-28 (×6): qty 1

## 2014-04-28 MED ORDER — SODIUM CHLORIDE 0.9 % IJ SOLN
10.0000 mL | Freq: Two times a day (BID) | INTRAMUSCULAR | Status: DC
  Administered 2014-04-28 – 2014-04-29 (×3): 10 mL

## 2014-04-28 NOTE — Progress Notes (Signed)
Lumen in central line clotted off from Blood. MD made aware. Order to place PICC.  Rise PaganiniURRY, Latoyna Hird R, RN

## 2014-04-28 NOTE — Progress Notes (Signed)
Recruitment maneuver performed for 02 desaturation 82%, pt tolerated well sats increased to 95%.

## 2014-04-28 NOTE — Progress Notes (Signed)
Transfused 1 unit Packed RBC's per MD order. Blood transfused following protocol, verified by 2 RN's. Blood transfused without complications, VS stable, no signs of reaction.  Rise PaganiniURRY, Stephon Weathers R, RN

## 2014-04-28 NOTE — Progress Notes (Signed)
eLink Physician-Brief Progress Note Patient Name: Job FoundsFrederick Braun DOB: 12/15/1955 MRN: [id number]   Date of Service  04/28/2014  HPI/Events of Note  Hypokalemia  eICU Interventions  Potassium replaced     Intervention Category Intermediate Interventions: Electrolyte abnormality - evaluation and management  Logyn Dedominicis 04/28/2014, 5:08 AM

## 2014-04-28 NOTE — Progress Notes (Signed)
PULMONARY / CRITICAL CARE MEDICINE   Name: Micheal Ayala MRN: [id number] DOB: 1956-02-02    ADMISSION DATE:  03/29/2014 CONSULTATION DATE:  03/03/2014  REFERRING MD :  Dr. Herbie Baltimore  CHIEF COMPLAINT:  STEMI  INITIAL PRESENTATION:  59 yo smoker with STEMI, cardiogenic shock, VDRF.  He is visiting [place] from [place].  STUDIES:  1/31 Cardiac cath >> severe CAD with occlusion of LAD, circumflex, RCS, ischemic CM >> DES to LAD, circumflex 1/31 Echo >> LVEF 20-25%, severely reduced systolic function 2/04 LHC > DES RCA 2/06 Echo >> EF 25 to 30%, mod LVH, grade 2 diastolic dysfx  SIGNIFICANT EVENTS: 1/31 admit, cardiac cath, impella 2/01 TCTS consulted, transfuse 1 unit PRBC 2/05 impella removed; ?upper GI bleeding  2/08 increased respiratory secretions 2/10 ARDS protocol, added levophed 2/11 start paralytic 2/12 - tried off nimbex, liberated to 7cc/kg  2/19- trach placed 2/20- trach huge cuff leak, desaturation, emergent ETT then retrach 2/21- pressors increased  SUBJECTIVE:  Unable to tolerate SBT.  VITAL SIGNS: Temp:  [98 F (36.7 C)-99.4 F (37.4 C)] 99 F (37.2 C) (02/27 0400) Pulse Rate:  [80-103] 80 (02/27 0600) Resp:  [18-39] 34 (02/27 0600) BP: (78-118)/(51-76) 92/69 mmHg (02/27 0600) SpO2:  [68 %-100 %] 94 % (02/27 0600) Arterial Line BP: (58-150)/(51-147) 58/52 mmHg (02/26 2000) FiO2 (%):  [50 %-100 %] 50 % (02/27 0425) Weight:  [225 lb 5 oz (102.2 kg)] 225 lb 5 oz (102.2 kg) (02/27 0419) VENTILATOR SETTINGS: Vent Mode:  [-] PRVC FiO2 (%):  [50 %-100 %] 50 % Set Rate:  [16 bmp] 16 bmp Vt Set:  [520 mL] 520 mL PEEP:  [10 cmH20-12 cmH20] 10 cmH20 Plateau Pressure:  [24 cmH20-30 cmH20] 30 cmH20 INTAKE / OUTPUT:  Intake/Output Summary (Last 24 hours) at 04/28/14 0703 Last data filed at 04/28/14 0600  Gross per 24 hour  Intake 5535.38 ml  Output   2225 ml  Net 3310.38 ml   PHYSICAL EXAMINATION:  Gen: ill appearing HEENT: Rt eye enucleated PULM: B coarse BS,  B crackles CV: regular AB: decreased bowel sounds, soft, no r/g Ext: 2+ BLE edema  Neuro: Moves with stim, pain. Does not follow commands   LABS:  CBC  Recent Labs Lab 04/26/14 0358 04/27/14 0500 04/28/14 0421  WBC 10.6* 12.3* 14.0*  HGB 7.9* 7.6* 7.7*  HCT 27.6* 27.4* 27.6*  PLT 282 257 240   Coag's  Recent Labs Lab 04/24/14 0420  INR 1.18   BMET  Recent Labs Lab 04/27/14 0500 04/27/14 1812 04/28/14 0421  NA 154* 152* 149*  K 2.4* 3.5 3.3*  CL 110 110 109  CO2 32 32 32  BUN 111* 118* 119*  CREATININE 1.27 1.36* 1.34  GLUCOSE 124* 156* 168*   Electrolytes  Recent Labs Lab 04/25/14 0500  04/27/14 0500 04/27/14 1812 04/28/14 0421  CALCIUM 8.0*  < > 8.1* 7.9* 7.7*  MG 2.4  --  2.5  --  2.5  PHOS 3.0  --  3.5  --  3.6  < > = values in this interval not displayed. ABG  Recent Labs Lab 04/23/14 0354 04/24/14 0537 04/26/14 0500  PHART 7.474* 7.431 7.457*  PCO2ART 45.4* 49.6* 45.0  PO2ART 90.5 127.0* 86.6   Glucose  Recent Labs Lab 04/27/14 0802 04/27/14 1316 04/27/14 1752 04/27/14 1940 04/27/14 2355 04/28/14 0417  GLUCAP 148* 172* 140* 186* 151* 159*    Dg Chest Port 1 View  04/27/2014   CLINICAL DATA:  Tracheostomy replaced  EXAM: PORTABLE CHEST -  1 VIEW  COMPARISON:  April 25, 2014  FINDINGS: Tracheostomy catheter tip is 4.5 cm above the carina. Feeding tube tip is below the diaphragm. Central catheter tip is in the superior vena cava. No pneumothorax. There is widespread interstitial and alveolar edema with mild cardiomegaly and pulmonary venous hypertension. No new opacity.  IMPRESSION: Tube and catheter positions as described without pneumothorax. Evidence of congestive heart failure. There may be a degree of superimposed ARDS. These changes are stable compared to 2 days prior.   Electronically Signed   By: Bretta BangWilliam  Woodruff III M.D.   On: 04/27/2014 15:04    ASSESSMENT / PLAN:  PULMONARY ETT 1/31>>>2/19 Trach [name] 2/19>>>re do  2/20>>> A:  Acute respiratory failure due to cardiogenic shock and pulmonary edema.   Superimposed MRSA HCAP +  severe ARDS. Very little progress being made re vent needs. Tobacco abuse. 2/20 emergent re-trach from cuff leak. P:   Full vent support F/u CXR Continue BD's  CARDIOVASCULAR L Impella 1/31>>>2/5 Lt [place] CVL 2/08 >> A:  Cardiogenic shock post STEMI on admission - echo 2/7 EF 25-30%.  STEMI r/t stent occlusion, now s/p DES to LAD and RCA Septic shock developed from MRSA PNA 2/10. SVT, A fib with RVR. P: ?if amiodarone can be changed to per tube >> defer to cardiology Continue ASA, lipitor, brilinta  RENAL A:  AKI in setting of cardiogenic/septic shock >> improved. Hypokalemia. Hypernatremia P:   Hold lasix for now Decrease IV fluid to 30 ml/hr  GASTROINTESTINAL A:   Nutrition. Constipation. ?upper GI bleed 2/05 >> no further episodes since. P:   Tube feeds Protonix for SUP  HEMATOLOGIC A:  Thrombocytopenia >> likely from impella >> resolved. Anemia of critical illness. P:  F/u CBC. SQ heparin for DVT prevention, no evidence bleeding  INFECTIOUS A: MRSA HCAP. P:    Monitor off ABx  ENDOCRINE A:   Hyperglycemia. P:   SSI with lantus  NEUROLOGIC A:  Acute encephalopathy 2nd to respiratory failure, cardiogenic shock. P:   RASS goal -1 Continue seroquel  Independent CC time 35 minutes  Coralyn HellingVineet Kristapher [place], MD [facility] 04/28/2014, 7:09 AM Pager:  [phone] After 3pm call: [phone]

## 2014-04-28 NOTE — Progress Notes (Signed)
Peripherally Inserted Central Catheter/Midline Placement  The IV Nurse has discussed with the patient and/or persons authorized to consent for the patient, the purpose of this procedure and the potential benefits and risks involved with this procedure.  The benefits include less needle sticks, lab draws from the catheter and patient may be discharged home with the catheter.  Risks include, but not limited to, infection, bleeding, blood clot (thrombus formation), and puncture of an artery; nerve damage and irregular heat beat.  Alternatives to this procedure were also discussed.  Consent obtained from wife and staff RN  PICC/Midline Placement Documentation  PICC / Midline Double Lumen 04/28/14 PICC Right Brachial 38 cm 0 cm (Active)  Indication for Insertion or Continuance of Line Limited venous access - need for IV therapy >5 days (PICC only);Poor Vasculature-patient has had multiple peripheral attempts or PIVs lasting less than 24 hours;Prolonged intravenous therapies 04/28/2014  5:37 PM  Exposed Catheter (cm) 0 cm 04/28/2014  5:37 PM  Site Assessment Clean;Dry;Intact 04/28/2014  5:37 PM  Lumen #1 Status Flushed;Saline locked;Blood return noted 04/28/2014  5:37 PM  Lumen #2 Status Flushed;Saline locked;Blood return noted 04/28/2014  5:37 PM  Dressing Type Transparent 04/28/2014  5:37 PM  Dressing Status Clean;Dry;Intact;Antimicrobial disc in place 04/28/2014  5:37 PM  Line Care Connections checked and tightened;Cap(s) changed 04/28/2014  5:37 PM  Line Adjustment (NICU/IV Team Only) No 04/28/2014  5:37 PM  Dressing Intervention New dressing 04/28/2014  5:37 PM  Dressing Change Due 05/05/14 04/28/2014  5:37 PM       Elliot Dallyiggs, Devra Stare Wright 04/28/2014, 5:37 PM

## 2014-04-28 NOTE — Progress Notes (Signed)
Patient ID: Micheal Ayala, male   DOB: 07/06/1955, 59 y.o.   MRN: [id number]  Advanced Heart Failure Rounding Note   Subjective:    Impella removed 2/5.  Echo (2/7) with EF 25-30%, peri-apical akinesis, no LV thrombus noted, normal RV.   Underwent trach 2/16 due to severe MRSA PNA and ARDS.    Extra-long trach placed yesterday. Off lasix due to hypernatremia. SBP in 70s last night Now 90-100. Off pressors Hgb 7.7  Remians NSR on IV amio    Objective:   Weight Range:  Vital Signs:   Temp:  [98 F (36.7 C)-99.4 F (37.4 C)] 99 F (37.2 C) (02/27 0400) Pulse Rate:  [80-103] 80 (02/27 0600) Resp:  [19-39] 34 (02/27 0600) BP: (78-118)/(51-76) 92/69 mmHg (02/27 0600) SpO2:  [68 %-100 %] 94 % (02/27 0600) Arterial Line BP: (58-150)/(51-147) 58/52 mmHg (02/26 2000) FiO2 (%):  [50 %-100 %] 50 % (02/27 0425) Weight:  [102.2 kg (225 lb 5 oz)] 102.2 kg (225 lb 5 oz) (02/27 0419) Last BM Date: 04/27/14  Weight change: Filed Weights   04/26/14 0500 04/27/14 0419 04/28/14 0419  Weight: 101.9 kg (224 lb 10.4 oz) 101.1 kg (222 lb 14.2 oz) 102.2 kg (225 lb 5 oz)    Intake/Output:   Intake/Output Summary (Last 24 hours) at 04/28/14 0759 Last data filed at 04/28/14 0600  Gross per 24 hour  Intake 5535.38 ml  Output   2225 ml  Net 3310.38 ml     Physical Exam: General:  On trach.  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   Cor: PMI laterally displaced. Regular  Lungs: diffuse rhonchi Abdomen: soft, nontender, + mildly distended. No hepatosplenomegaly. No bruits or masses. Hypoactive bowel sounds. Extremities: no cyanosis, clubbing, rash, 1-2+ edema. Neuro: awake on vent. agitated  Telemetry: Sinus 80-90  Labs: Basic Metabolic Panel:  Recent Labs Lab 04/22/14 0400 04/23/14 0400  04/25/14 0500 04/25/14 2140 04/26/14 0358 04/27/14 0500 04/27/14 1812 04/28/14 0421  NA 143 145  < > 150* 147* 150* 154* 152* 149*  K  3.4* 3.2*  < > 2.9* 4.0 3.6 2.4* 3.5 3.3*  CL 101 101  < > 109 111 107 110 110 109  CO2 36* 32  < > 33* 31 33* 32 32 32  GLUCOSE 168* 141*  < > 124* 141* 121* 124* 156* 168*  BUN 100* 94*  < > 106* 113* 116* 111* 118* 119*  CREATININE 1.53* 1.35  < > 1.34 1.46* 1.42* 1.27 1.36* 1.34  CALCIUM 7.8* 8.0*  < > 8.0* 7.9* 8.0* 8.1* 7.9* 7.7*  MG 2.3 2.3  --  2.4  --   --  2.5  --  2.5  PHOS 4.2 3.4  --  3.0  --   --  3.5  --  3.6  < > = values in this interval not displayed.  Liver Function Tests:  Recent Labs Lab 04/22/14 0400 04/24/14 0420  AST 82* 78*  ALT 75* 68*  ALKPHOS 317* 227*  BILITOT 1.4* 1.1  PROT 5.9* 5.7*  ALBUMIN 1.9* 1.7*   No results for input(s): LIPASE, AMYLASE in the last 168 hours. No results for input(s): AMMONIA in the last 168 hours.  CBC:  Recent Labs Lab 04/22/14 0400 04/23/14 0400 04/24/14 0420 04/25/14 0500 04/26/14 0358 04/27/14 0500 04/28/14 0421  WBC 13.7* 11.2* 7.8 10.5 10.6* 12.3* 14.0*  NEUTROABS 11.7* 10.0*  --   --   --   --   --  HGB 8.8* 8.2* 7.7* 8.2* 7.9* 7.6* 7.7*  HCT 30.6* 28.0* 26.6* 28.3* 27.6* 27.4* 27.6*  MCV 96.2 96.2 96.4 97.3 97.5 98.6 100.4*  PLT 322 267 256 318 282 257 240    Cardiac Enzymes: No results for input(s): CKTOTAL, CKMB, CKMBINDEX, TROPONINI in the last 168 hours.  BNP: BNP (last 3 results) No results for input(s): PROBNP in the last 8760 hours.   Other results:    Imaging: Dg Chest Port 1 View  04/28/2014   CLINICAL DATA:  Acute respiratory failure  EXAM: PORTABLE CHEST - 1 VIEW  COMPARISON:  04/27/2014  FINDINGS: Cardiac shadow is stable. Diffuse vascular congestion and pulmonary edema is again identified and stable from the previous day. Feeding tube, tracheostomy catheter and left subclavian central line are again seen and within normal limits. Small right-sided pleural effusion is noted.  IMPRESSION: The overall appearance is stable from the prior exam consistent with extensive CHF and pulmonary  edema.   Electronically Signed   By: Alcide CleverMark  Lukens M.D.   On: 04/28/2014 07:52   Dg Chest Port 1 View  04/27/2014   CLINICAL DATA:  Tracheostomy replaced  EXAM: PORTABLE CHEST - 1 VIEW  COMPARISON:  April 25, 2014  FINDINGS: Tracheostomy catheter tip is 4.5 cm above the carina. Feeding tube tip is below the diaphragm. Central catheter tip is in the superior vena cava. No pneumothorax. There is widespread interstitial and alveolar edema with mild cardiomegaly and pulmonary venous hypertension. No new opacity.  IMPRESSION: Tube and catheter positions as described without pneumothorax. Evidence of congestive heart failure. There may be a degree of superimposed ARDS. These changes are stable compared to 2 days prior.   Electronically Signed   By: Bretta BangWilliam  Woodruff III M.D.   On: 04/27/2014 15:04     Medications:     Scheduled Medications: . antiseptic oral rinse  7 mL Mouth Rinse QID  . aspirin  81 mg Per Tube Daily  . atorvastatin  80 mg Per Tube q1800  . chlorhexidine  15 mL Mouth Rinse BID  . docusate  100 mg Oral BID  . feeding supplement (PRO-STAT SUGAR FREE 64)  60 mL Per Tube TID  . feeding supplement (VITAL HIGH PROTEIN)  1,000 mL Per Tube Q24H  . free water  300 mL Per Tube Q6H  . heparin subcutaneous  5,000 Units Subcutaneous 3 times per day  . insulin aspart  0-15 Units Subcutaneous 6 times per day  . insulin glargine  35 Units Subcutaneous Daily  . ipratropium  0.5 mg Nebulization Q6H  . levalbuterol  0.63 mg Nebulization Q6H  . multivitamin  5 mL Per Tube Daily  . pantoprazole sodium  40 mg Per Tube Q24H  . QUEtiapine  50 mg Oral QHS  . sodium chloride  10-40 mL Intracatheter Q12H  . ticagrelor  90 mg Per Tube BID    Infusions: . sodium chloride 10 mL/hr at 04/27/14 1800  . amiodarone 30 mg/hr (04/28/14 0420)  . dextrose 30 mL/hr at 04/28/14 0755  . fentaNYL infusion INTRAVENOUS 150 mcg/hr (04/27/14 2200)  . norepinephrine (LEVOPHED) Adult infusion Stopped (04/25/14  1600)    PRN Medications: acetaminophen (TYLENOL) oral liquid 160 mg/5 mL, bisacodyl, fentaNYL, Influenza vac split quadrivalent PF, levalbuterol, ondansetron (ZOFRAN) IV, pneumococcal 23 valent vaccine, sennosides, sodium chloride, sorbitol   Assessment:   1. Cardiogenic shock    --s/p Impella placement 1/31, Impella out 2/5.  2. Acute on chronic systolic HF    --EF 10% on  echo    --Repeat echo (2/7) with EF 25-30%, peri-apical akinesis, no LV thrombus, normal RV 3. Anterolateral STEMI 1/31   --due to stent occlusion   --s/p PCI with DES to LAD and OM-2 on 1/31   --s/p PCI with DES to RCA 2/5 4. VDRF 5. CAD 6. Tobacco abuse ongoing 7. 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 Hypomagnesemia/hyponatremia 9. Anemia s/p 3u RBCs 10. SVT: Amiodarone started 11. MRSA Pneumonia, hospital acquired 12. Acute kidney injury 13. Hypernatremia 14. Protein-calorie malnutirition  Plan/Discussion:    Main issue continues to be respiratory failure due to severe MRSA PNA and ARDS. S/p trach c/b air leak. Now with extra-long trach.  Despite trach, respiratory status remains very tenuous with little progress being made despite aggressive efforts. P/CCM managing ARDS and airway.   HF currently stable. Lasix stopped now on D5 for hypernatremia.  Continue ASA, statin, brilinta. BP too low for ACE/b-blocker.    Switch amio to po  Will transfuse 1 u pRBCs  Prognosis increasingly concerning. Will speak with family again next week.  The patient is critically ill with multiple organ systems failure and requires high complexity decision making for assessment and support, frequent evaluation and titration of therapies, application of advanced monitoring technologies and extensive interpretation of multiple databases.   Critical Care Time devoted to patient care services described in this note is 35 Minutes.     Length of Stay: 27 Arvilla Meres MD 04/28/2014, 7:59  AM  Advanced Heart Failure Team Pager [phone] (M-F; 7a - 4p)  Please contact [facility] for night-coverage after hours (4p -7a ) and weekends on [email or web address]

## 2014-04-29 LAB — CBC
HCT: 28.6 % — ABNORMAL LOW (ref 39.0–52.0)
HEMOGLOBIN: 8.3 g/dL — AB (ref 13.0–17.0)
MCH: 28.6 pg (ref 26.0–34.0)
MCHC: 29 g/dL — AB (ref 30.0–36.0)
MCV: 98.6 fL (ref 78.0–100.0)
Platelets: 219 10*3/uL (ref 150–400)
RBC: 2.9 MIL/uL — AB (ref 4.22–5.81)
RDW: 20.6 % — AB (ref 11.5–15.5)
WBC: 15.2 10*3/uL — AB (ref 4.0–10.5)

## 2014-04-29 LAB — TYPE AND SCREEN
ABO/RH(D): O POS
ANTIBODY SCREEN: NEGATIVE
Unit division: 0

## 2014-04-29 LAB — BASIC METABOLIC PANEL
Anion gap: 11 (ref 5–15)
BUN: 118 mg/dL — ABNORMAL HIGH (ref 6–23)
CO2: 31 mmol/L (ref 19–32)
CREATININE: 1.32 mg/dL (ref 0.50–1.35)
Calcium: 7.5 mg/dL — ABNORMAL LOW (ref 8.4–10.5)
Chloride: 104 mmol/L (ref 96–112)
GFR calc Af Amer: 67 mL/min — ABNORMAL LOW (ref >90)
GFR calc non Af Amer: 58 mL/min — ABNORMAL LOW (ref >90)
Glucose, Bld: 289 mg/dL — ABNORMAL HIGH (ref 70–99)
Potassium: 2.7 mmol/L — CL (ref 3.5–5.1)
SODIUM: 146 mmol/L — AB (ref 135–145)

## 2014-04-29 LAB — GLUCOSE, CAPILLARY
GLUCOSE-CAPILLARY: 122 mg/dL — AB (ref 70–99)
Glucose-Capillary: 123 mg/dL — ABNORMAL HIGH (ref 70–99)
Glucose-Capillary: 128 mg/dL — ABNORMAL HIGH (ref 70–99)
Glucose-Capillary: 128 mg/dL — ABNORMAL HIGH (ref 70–99)
Glucose-Capillary: 156 mg/dL — ABNORMAL HIGH (ref 70–99)
Glucose-Capillary: 158 mg/dL — ABNORMAL HIGH (ref 70–99)

## 2014-04-29 MED ORDER — POTASSIUM CHLORIDE 20 MEQ/15ML (10%) PO SOLN
40.0000 meq | ORAL | Status: AC
Start: 2014-04-29 — End: 2014-04-29
  Administered 2014-04-29 (×3): 40 meq
  Filled 2014-04-29 (×6): qty 30

## 2014-04-29 MED ORDER — POTASSIUM CHLORIDE 20 MEQ/15ML (10%) PO SOLN
40.0000 meq | Freq: Once | ORAL | Status: AC
  Administered 2014-04-29: 40 meq
  Filled 2014-04-29: qty 30

## 2014-04-29 MED ORDER — FUROSEMIDE 10 MG/ML IJ SOLN
60.0000 mg | Freq: Once | INTRAMUSCULAR | Status: AC
  Administered 2014-04-29: 60 mg via INTRAVENOUS
  Filled 2014-04-29: qty 6

## 2014-04-29 MED ORDER — NOREPINEPHRINE BITARTRATE 1 MG/ML IV SOLN
0.0000 ug/min | INTRAVENOUS | Status: DC
  Administered 2014-04-29: 5 ug/min via INTRAVENOUS
  Administered 2014-04-30: 12 ug/min via INTRAVENOUS
  Filled 2014-04-29 (×3): qty 16

## 2014-04-29 NOTE — Progress Notes (Signed)
Patient ID: Micheal Ayala, male   DOB: 06-17-1955, 59 y.o.   MRN: [id number]  Advanced Heart Failure Rounding Note   Subjective:    Impella removed 2/5.  Echo (2/7) with EF 25-30%, peri-apical akinesis, no LV thrombus noted, normal RV.   Underwent trach 2/16 due to severe MRSA PNA and ARDS.    Extra-long trach placed 2/27.  FiO2 turned down to 50%. Now back on 70%. Got 1u RBCs yesterday.  Remains off pressors.   Remians NSR. Now on po amio     Objective:   Weight Range:  Vital Signs:   Temp:  [97.9 F (36.6 C)-99.8 F (37.7 C)] 99 F (37.2 C) (02/28 0400) Pulse Rate:  [77-103] 101 (02/28 0800) Resp:  [23-35] 25 (02/28 0800) BP: (82-119)/(50-80) 90/55 mmHg (02/28 0844) SpO2:  [86 %-97 %] 92 % (02/28 0800) FiO2 (%):  [50 %-70 %] 70 % (02/28 0844) Weight:  [102.6 kg (226 lb 3.1 oz)] 102.6 kg (226 lb 3.1 oz) (02/28 0500) Last BM Date: 04/27/14  Weight change: Filed Weights   04/27/14 0419 04/28/14 0419 04/29/14 0500  Weight: 101.1 kg (222 lb 14.2 oz) 102.2 kg (225 lb 5 oz) 102.6 kg (226 lb 3.1 oz)    Intake/Output:   Intake/Output Summary (Last 24 hours) at 04/29/14 0926 Last data filed at 04/29/14 0600  Gross per 24 hour  Intake 2623.22 ml  Output   2200 ml  Net 423.22 ml     Physical Exam: General:  On trach.  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   Cor: PMI laterally displaced. Regular  Lungs: diffuse rhonchi Abdomen: soft, nontender, + mildly distended. No hepatosplenomegaly. No bruits or masses. Hypoactive bowel sounds. Extremities: no cyanosis, clubbing, rash, 1+ edema. Neuro: on vent. Sedated   Telemetry: Sinus 80-90  Labs: Basic Metabolic Panel:  Recent Labs Lab 04/23/14 0400  04/25/14 0500  04/26/14 0358 04/27/14 0500 04/27/14 1812 04/28/14 0421 04/29/14 0500  NA 145  < > 150*  < > 150* 154* 152* 149* 146*  K 3.2*  < > 2.9*  < > 3.6 2.4* 3.5 3.3* 2.7*  CL 101  < > 109  < > 107 110 110  109 104  CO2 32  < > 33*  < > 33* 32 32 32 31  GLUCOSE 141*  < > 124*  < > 121* 124* 156* 168* 289*  BUN 94*  < > 106*  < > 116* 111* 118* 119* 118*  CREATININE 1.35  < > 1.34  < > 1.42* 1.27 1.36* 1.34 1.32  CALCIUM 8.0*  < > 8.0*  < > 8.0* 8.1* 7.9* 7.7* 7.5*  MG 2.3  --  2.4  --   --  2.5  --  2.5  --   PHOS 3.4  --  3.0  --   --  3.5  --  3.6  --   < > = values in this interval not displayed.  Liver Function Tests:  Recent Labs Lab 04/24/14 0420  AST 78*  ALT 68*  ALKPHOS 227*  BILITOT 1.1  PROT 5.7*  ALBUMIN 1.7*   No results for input(s): LIPASE, AMYLASE in the last 168 hours. No results for input(s): AMMONIA in the last 168 hours.  CBC:  Recent Labs Lab 04/23/14 0400  04/25/14 0500 04/26/14 0358 04/27/14 0500 04/28/14 0421 04/29/14 0500  WBC 11.2*  < > 10.5 10.6* 12.3* 14.0* 15.2*  NEUTROABS 10.0*  --   --   --   --   --   --  HGB 8.2*  < > 8.2* 7.9* 7.6* 7.7* 8.3*  HCT 28.0*  < > 28.3* 27.6* 27.4* 27.6* 28.6*  MCV 96.2  < > 97.3 97.5 98.6 100.4* 98.6  PLT 267  < > 318 282 257 240 219  < > = values in this interval not displayed.  Cardiac Enzymes: No results for input(s): CKTOTAL, CKMB, CKMBINDEX, TROPONINI in the last 168 hours.  BNP: BNP (last 3 results) No results for input(s): PROBNP in the last 8760 hours.   Other results:    Imaging: Dg Chest Port 1 View  04/28/2014   CLINICAL DATA:  Acute respiratory failure  EXAM: PORTABLE CHEST - 1 VIEW  COMPARISON:  04/27/2014  FINDINGS: Cardiac shadow is stable. Diffuse vascular congestion and pulmonary edema is again identified and stable from the previous day. Feeding tube, tracheostomy catheter and left subclavian central line are again seen and within normal limits. Small right-sided pleural effusion is noted.  IMPRESSION: The overall appearance is stable from the prior exam consistent with extensive CHF and pulmonary edema.   Electronically Signed   By: Alcide Clever M.D.   On: 04/28/2014 07:52   Dg  Chest Port 1 View  04/27/2014   CLINICAL DATA:  Tracheostomy replaced  EXAM: PORTABLE CHEST - 1 VIEW  COMPARISON:  April 25, 2014  FINDINGS: Tracheostomy catheter tip is 4.5 cm above the carina. Feeding tube tip is below the diaphragm. Central catheter tip is in the superior vena cava. No pneumothorax. There is widespread interstitial and alveolar edema with mild cardiomegaly and pulmonary venous hypertension. No new opacity.  IMPRESSION: Tube and catheter positions as described without pneumothorax. Evidence of congestive heart failure. There may be a degree of superimposed ARDS. These changes are stable compared to 2 days prior.   Electronically Signed   By: Bretta Bang III M.D.   On: 04/27/2014 15:04   Dg Abd Portable 1v  04/28/2014   CLINICAL DATA:  Feeding catheter placement  EXAM: PORTABLE ABDOMEN - 1 VIEW  COMPARISON:  04/23/2014  FINDINGS: Scattered large and small bowel gas is noted. A feeding catheter is noted within the mid stomach. No bony abnormality is seen.  IMPRESSION: Feeding catheter within the mid stomach.   Electronically Signed   By: Alcide Clever M.D.   On: 04/28/2014 09:10     Medications:     Scheduled Medications: . amiodarone  200 mg Oral BID  . antiseptic oral rinse  7 mL Mouth Rinse QID  . aspirin  81 mg Per Tube Daily  . atorvastatin  80 mg Per Tube q1800  . chlorhexidine  15 mL Mouth Rinse BID  . docusate  100 mg Oral BID  . feeding supplement (PRO-STAT SUGAR FREE 64)  60 mL Per Tube TID  . feeding supplement (VITAL HIGH PROTEIN)  1,000 mL Per Tube Q24H  . free water  300 mL Per Tube Q6H  . heparin subcutaneous  5,000 Units Subcutaneous 3 times per day  . insulin aspart  0-15 Units Subcutaneous 6 times per day  . insulin glargine  35 Units Subcutaneous Daily  . ipratropium  0.5 mg Nebulization Q6H  . levalbuterol  0.63 mg Nebulization Q6H  . multivitamin  5 mL Per Tube Daily  . pantoprazole sodium  40 mg Per Tube Q24H  . potassium chloride  40 mEq Per  Tube Q4H  . potassium chloride  40 mEq Per Tube Once  . QUEtiapine  50 mg Oral QHS  . sodium chloride  10-40  mL Intracatheter Q12H  . sodium chloride  10-40 mL Intracatheter Q12H  . ticagrelor  90 mg Per Tube BID    Infusions: . sodium chloride 10 mL/hr at 04/27/14 1800  . dextrose 30 mL/hr at 04/28/14 2000  . fentaNYL infusion INTRAVENOUS 200 mcg/hr (04/29/14 0539)    PRN Medications: acetaminophen (TYLENOL) oral liquid 160 mg/5 mL, bisacodyl, fentaNYL, Influenza vac split quadrivalent PF, levalbuterol, ondansetron (ZOFRAN) IV, pneumococcal 23 valent vaccine, sennosides, sodium chloride, sodium chloride, sorbitol   Assessment:   1. Cardiogenic shock    --s/p Impella placement 1/31, Impella out 2/5.  2. Acute on chronic systolic HF    --EF 10% on echo    --Repeat echo (2/7) with EF 25-30%, peri-apical akinesis, no LV thrombus, normal RV 3. Anterolateral STEMI 1/31   --due to stent occlusion   --s/p PCI with DES to LAD and OM-2 on 1/31   --s/p PCI with DES to RCA 2/5 4. VDRF 5. CAD 6. Tobacco abuse ongoing 7. 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 Hypomagnesemia/hyponatremia 9. Anemia s/p 3u RBCs 10. SVT: Amiodarone started 11. MRSA Pneumonia, hospital acquired 12. Acute kidney injury 13. Hypernatremia 14. Protein-calorie malnutirition  Plan/Discussion:    Main issue continues to be respiratory failure due to severe MRSA PNA and ARDS. S/p trach c/b air leak. Now with extra-long trach.  Despite trach, respiratory status remains very tenuous with little or no progress being made despite aggressive efforts. P/CCM managing ARDS and airway.   HF currently stable. Lasix stopped now on D5 for hypernatremia.  Continue ASA, statin, brilinta. BP too low for ACE/b-blocker.   Continue po amio   Hypernatremia improving. Will supp K+  Prognosis increasingly concerning. Will speak with family again in am.  The patient is critically ill with multiple organ  systems failure and requires high complexity decision making for assessment and support, frequent evaluation and titration of therapies, application of advanced monitoring technologies and extensive interpretation of multiple databases.   Critical Care Time devoted to patient care services described in this note is 35 Minutes.   Length of Stay: [id number] Arvilla Meresaniel Bensimhon MD 04/29/2014, 9:26 AM  Advanced Heart Failure Team Pager [phone] (M-F; 7a - 4p)  Please contact [facility] for night-coverage after hours (4p -7a ) and weekends on [email or web address]

## 2014-04-29 NOTE — Progress Notes (Signed)
Patient map Goal is 60-65%. BP's have been running soft with maps from 58-65. MD made aware of soft BP's. Patient was given dose of lasix this morning. Will continue to monitor BP and see if it level's itself back out. No orders at this time.  Rise PaganiniURRY, Damarkus Balis R, RN

## 2014-04-29 NOTE — Progress Notes (Signed)
Pt daughter wanted to inform nurse that when pt. Is ready to transfer, they would like him to go to [facility] if possible. This is their 1st choice.  Rise PaganiniURRY, Noell Lorensen R, RN

## 2014-04-29 NOTE — Progress Notes (Signed)
LRM performed, pt tolerated well sats increased to 94%.

## 2014-04-29 NOTE — Progress Notes (Signed)
eLink Physician-Brief Progress Note Patient Name: Micheal Ayala DOB: 11/30/1955 MRN: [id number]   Date of Service  04/29/2014  HPI/Events of Note  Borderline hypotension  eICU Interventions  Norepi ordered if needed to maintain MAP > 65 mmHg     Intervention Category Intermediate Interventions: Hypotension - evaluation and management  Billy FischerDavid Simonds 04/29/2014, 8:40 PM

## 2014-04-29 NOTE — Progress Notes (Signed)
PULMONARY / CRITICAL CARE MEDICINE   Name: Micheal Ayala MRN: [id number] DOB: 03/25/1955    ADMISSION DATE:  03/05/2014 CONSULTATION DATE:  03/31/2014  REFERRING MD :  Dr. Herbie Ayala  CHIEF COMPLAINT:  STEMI  INITIAL PRESENTATION:  59 yo smoker with STEMI, cardiogenic shock, VDRF.  He is visiting [place] from [place].  STUDIES:  1/31 Cardiac cath >> severe CAD with occlusion of LAD, circumflex, RCS, ischemic CM >> DES to LAD, circumflex 1/31 Echo >> LVEF 20-25%, severely reduced systolic function 2/04 LHC > DES RCA 2/06 Echo >> EF 25 to 30%, mod LVH, grade 2 diastolic dysfx  SIGNIFICANT EVENTS: 1/31 admit, cardiac cath, impella 2/01 TCTS consulted, transfuse 1 unit PRBC 2/05 impella removed; ?upper GI bleeding  2/08 increased respiratory secretions 2/10 ARDS protocol, added levophed 2/11 start paralytic 2/12 tried off nimbex, liberated to 7cc/kg  2/19 trach placed 2/20 trach huge cuff leak, desaturation, emergent ETT then retrach 2/21 pressors increased  SUBJECTIVE:  PEEP/FIO2 increased over night.  VITAL SIGNS: Temp:  [97.9 F (36.6 C)-99.8 F (37.7 C)] 99 F (37.2 C) (02/28 0400) Pulse Rate:  [40-103] 103 (02/28 0700) Resp:  [23-35] 26 (02/28 0700) BP: (82-119)/(50-80) 92/69 mmHg (02/28 0700) SpO2:  [72 %-97 %] 92 % (02/28 0700) FiO2 (%):  [50 %-70 %] 70 % (02/28 0551) Weight:  [226 lb 3.1 oz (102.6 kg)] 226 lb 3.1 oz (102.6 kg) (02/28 0500) VENTILATOR SETTINGS: Vent Mode:  [-] PRVC FiO2 (%):  [50 %-70 %] 70 % Set Rate:  [24 bmp] 24 bmp Vt Set:  [520 mL] 520 mL PEEP:  [8 cmH20-10 cmH20] 10 cmH20 Plateau Pressure:  [25 cmH20-32 cmH20] 29 cmH20 INTAKE / OUTPUT:  Intake/Output Summary (Last 24 hours) at 04/29/14 [id number] Last data filed at 04/29/14 0600  Gross per 24 hour  Intake 2623.22 ml  Output   2200 ml  Net 423.22 ml   PHYSICAL EXAMINATION:  Gen: ill appearing HEENT: Rt eye enucleated PULM: B coarse BS, B crackles CV: regular AB: decreased bowel sounds,  soft, no r/g Ext: 2+ BLE edema  Neuro: Moves with stim, pain. Does not follow commands   LABS:  CBC  Recent Labs Lab 04/27/14 0500 04/28/14 0421 04/29/14 0500  WBC 12.3* 14.0* 15.2*  HGB 7.6* 7.7* 8.3*  HCT 27.4* 27.6* 28.6*  PLT 257 240 219   Coag's  Recent Labs Lab 04/24/14 0420  INR 1.18   BMET  Recent Labs Lab 04/27/14 1812 04/28/14 0421 04/29/14 0500  NA 152* 149* 146*  K 3.5 3.3* 2.7*  CL 110 109 104  CO2 32 32 31  BUN 118* 119* 118*  CREATININE 1.36* 1.34 1.32  GLUCOSE 156* 168* 289*   Electrolytes  Recent Labs Lab 04/25/14 0500  04/27/14 0500 04/27/14 1812 04/28/14 0421 04/29/14 0500  CALCIUM 8.0*  < > 8.1* 7.9* 7.7* 7.5*  MG 2.4  --  2.5  --  2.5  --   PHOS 3.0  --  3.5  --  3.6  --   < > = values in this interval not displayed. ABG  Recent Labs Lab 04/23/14 0354 04/24/14 0537 04/26/14 0500  PHART 7.474* 7.431 7.457*  PCO2ART 45.4* 49.6* 45.0  PO2ART 90.5 127.0* 86.6   Glucose  Recent Labs Lab 04/28/14 1157 04/28/14 1811 04/28/14 2042 04/28/14 2348 04/29/14 0537 04/29/14 0737  GLUCAP 165* 136* 145* 128* 123* 128*    Dg Chest Port 1 View  04/28/2014   CLINICAL DATA:  Acute respiratory failure  EXAM: PORTABLE  CHEST - 1 VIEW  COMPARISON:  04/27/2014  FINDINGS: Cardiac shadow is stable. Diffuse vascular congestion and pulmonary edema is again identified and stable from the previous day. Feeding tube, tracheostomy catheter and left subclavian central line are again seen and within normal limits. Small right-sided pleural effusion is noted.  IMPRESSION: The overall appearance is stable from the prior exam consistent with extensive CHF and pulmonary edema.   Electronically Signed   By: Alcide Clever M.D.   On: 04/28/2014 07:52   Dg Chest Port 1 View  04/27/2014   CLINICAL DATA:  Tracheostomy replaced  EXAM: PORTABLE CHEST - 1 VIEW  COMPARISON:  April 25, 2014  FINDINGS: Tracheostomy catheter tip is 4.5 cm above the carina. Feeding  tube tip is below the diaphragm. Central catheter tip is in the superior vena cava. No pneumothorax. There is widespread interstitial and alveolar edema with mild cardiomegaly and pulmonary venous hypertension. No new opacity.  IMPRESSION: Tube and catheter positions as described without pneumothorax. Evidence of congestive heart failure. There may be a degree of superimposed ARDS. These changes are stable compared to 2 days prior.   Electronically Signed   By: Bretta Bang III M.D.   On: 04/27/2014 15:04   Dg Abd Portable 1v  04/28/2014   CLINICAL DATA:  Feeding catheter placement  EXAM: PORTABLE ABDOMEN - 1 VIEW  COMPARISON:  04/23/2014  FINDINGS: Scattered large and small bowel gas is noted. A feeding catheter is noted within the mid stomach. No bony abnormality is seen.  IMPRESSION: Feeding catheter within the mid stomach.   Electronically Signed   By: Alcide Clever M.D.   On: 04/28/2014 09:10    ASSESSMENT / PLAN:  PULMONARY ETT 1/31>>>2/19 Trach [name] 2/19>>>re do 2/20>>> A:  Acute respiratory failure due to cardiogenic shock and pulmonary edema.   Superimposed MRSA HCAP +  severe ARDS. Very little progress being made re vent needs. Tobacco abuse. 2/20 emergent re-trach from cuff leak. P:   Full vent support F/u CXR Continue BD's  CARDIOVASCULAR L Impella 1/31>>>2/5 Lt [place] CVL 2/08 >> A:  Cardiogenic shock post STEMI on admission - echo 2/7 EF 25-30%.  STEMI r/t stent occlusion, now s/p DES to LAD and RCA Septic shock developed from MRSA PNA 2/10. SVT, A fib with RVR. P: Continue ASA, lipitor, brilinta, amiodarone  RENAL A:  AKI in setting of cardiogenic/septic shock >> improved. Hypokalemia. Hypernatremia >> improving. Hypervolemia. P:   Give lasix 60 mg IV one on 2/28 Decrease IV fluid to 30 ml/hr  GASTROINTESTINAL A:   Nutrition. Constipation. ?upper GI bleed 2/05 >> no further episodes since. P:   Tube feeds Protonix for SUP  HEMATOLOGIC A:   Thrombocytopenia >> likely from impella >> resolved. Anemia of critical illness. P:  F/u CBC intermittently SQ heparin for DVT prevention, no evidence bleeding  INFECTIOUS A: MRSA HCAP >> completed Abx 2/22. P:    Monitor off ABx  ENDOCRINE A:   Hyperglycemia. P:   SSI with lantus  NEUROLOGIC A:  Acute encephalopathy 2nd to respiratory failure, cardiogenic shock. P:   RASS goal -1 Continue seroquel  SUMMARY: Not making any progress with vent weaning.  Might need to have further d/w family about goals of care.  Independent CC time 35 minutes  [name], MD [facility] Pulmonary/Critical Care 04/29/2014, 8:22 AM Pager:  [phone] After 3pm call: [phone]

## 2014-04-29 NOTE — Progress Notes (Signed)
eLink Physician-Brief Progress Note Patient Name: Micheal Ayala DOB: 11/02/1955 MRN: [id number]   Date of Service  04/29/2014  HPI/Events of Note  Hypokalemia  eICU Interventions  Potassium replaced     Intervention Category Major Interventions: Electrolyte abnormality - evaluation and management  DETERDING,ELIZABETH 04/29/2014, 6:13 AM

## 2014-04-30 ENCOUNTER — Inpatient Hospital Stay (HOSPITAL_COMMUNITY): Payer: Medicare Other

## 2014-04-30 DIAGNOSIS — J810 Acute pulmonary edema: Secondary | ICD-10-CM | POA: Insufficient documentation

## 2014-04-30 LAB — MAGNESIUM: MAGNESIUM: 2.8 mg/dL — AB (ref 1.5–2.5)

## 2014-04-30 LAB — BASIC METABOLIC PANEL
ANION GAP: 3 — AB (ref 5–15)
BUN: 145 mg/dL — ABNORMAL HIGH (ref 6–23)
CO2: 34 mmol/L — AB (ref 19–32)
Calcium: 7.6 mg/dL — ABNORMAL LOW (ref 8.4–10.5)
Chloride: 111 mmol/L (ref 96–112)
Creatinine, Ser: 1.57 mg/dL — ABNORMAL HIGH (ref 0.50–1.35)
GFR calc Af Amer: 54 mL/min — ABNORMAL LOW (ref >90)
GFR calc non Af Amer: 47 mL/min — ABNORMAL LOW (ref >90)
Glucose, Bld: 156 mg/dL — ABNORMAL HIGH (ref 70–99)
POTASSIUM: 4 mmol/L (ref 3.5–5.1)
Sodium: 148 mmol/L — ABNORMAL HIGH (ref 135–145)

## 2014-04-30 LAB — POCT I-STAT 3, ART BLOOD GAS (G3+)
Acid-Base Excess: 2 mmol/L (ref 0.0–2.0)
Bicarbonate: 26.5 mEq/L — ABNORMAL HIGH (ref 20.0–24.0)
O2 SAT: 95 %
PCO2 ART: 44 mmHg (ref 35.0–45.0)
TCO2: 28 mmol/L (ref 0–100)
pH, Arterial: 7.391 (ref 7.350–7.450)
pO2, Arterial: 79 mmHg — ABNORMAL LOW (ref 80.0–100.0)

## 2014-04-30 LAB — GLUCOSE, CAPILLARY
GLUCOSE-CAPILLARY: 147 mg/dL — AB (ref 70–99)
GLUCOSE-CAPILLARY: 156 mg/dL — AB (ref 70–99)
Glucose-Capillary: 143 mg/dL — ABNORMAL HIGH (ref 70–99)
Glucose-Capillary: 219 mg/dL — ABNORMAL HIGH (ref 70–99)
Glucose-Capillary: 314 mg/dL — ABNORMAL HIGH (ref 70–99)

## 2014-04-30 MED ORDER — POTASSIUM CHLORIDE 20 MEQ/15ML (10%) PO SOLN
40.0000 meq | Freq: Two times a day (BID) | ORAL | Status: AC
  Administered 2014-04-30 – 2014-05-01 (×4): 40 meq
  Filled 2014-04-30 (×5): qty 30

## 2014-04-30 MED ORDER — FREE WATER
300.0000 mL | Status: DC
  Administered 2014-04-30 – 2014-05-02 (×12): 300 mL

## 2014-04-30 MED ORDER — INSULIN GLARGINE 100 UNIT/ML ~~LOC~~ SOLN
35.0000 [IU] | Freq: Two times a day (BID) | SUBCUTANEOUS | Status: DC
  Administered 2014-04-30 – 2014-05-01 (×2): 35 [IU] via SUBCUTANEOUS
  Filled 2014-04-30 (×5): qty 0.35

## 2014-04-30 MED ORDER — FUROSEMIDE 10 MG/ML IJ SOLN
40.0000 mg | Freq: Once | INTRAMUSCULAR | Status: AC
  Administered 2014-04-30: 40 mg via INTRAVENOUS
  Filled 2014-04-30: qty 4

## 2014-04-30 MED ORDER — ACETAMINOPHEN 160 MG/5ML PO SOLN: 650.0000 mg | ORAL | Status: DC | PRN

## 2014-04-30 MED ORDER — METHYLPREDNISOLONE SODIUM SUCC 125 MG IJ SOLR
80.0000 mg | Freq: Two times a day (BID) | INTRAMUSCULAR | Status: DC
  Administered 2014-04-30 – 2014-05-02 (×5): 80 mg via INTRAVENOUS
  Filled 2014-04-30: qty 2
  Filled 2014-04-30 (×4): qty 1.28
  Filled 2014-04-30: qty 2

## 2014-04-30 MED ORDER — ARTIFICIAL TEARS OP OINT
TOPICAL_OINTMENT | OPHTHALMIC | Status: DC | PRN
  Filled 2014-04-30: qty 3.5

## 2014-04-30 MED ORDER — FUROSEMIDE 10 MG/ML IJ SOLN
4.0000 mg/h | INTRAVENOUS | Status: DC
  Administered 2014-04-30: 4 mg/h via INTRAVENOUS
  Filled 2014-04-30: qty 25

## 2014-04-30 NOTE — Progress Notes (Signed)
Patient ID: Micheal Ayala, male   DOB: 01/12/1956, 59 y.o.   MRN: [id number]  Advanced Heart Failure Rounding Note   Subjective:    Impella removed 2/5.  Echo (2/7) with EF 25-30%, peri-apical akinesis, no LV thrombus noted, normal RV.   Underwent trach 2/16 due to severe MRSA PNA and ARDS.    Extra-long trach placed 2/27.  FiO2 turned down to 50%. Now back on 80%. Norepi restarted yesterday  Remians NSR. Now on po amio     Objective:   Weight Range:  Vital Signs:   Temp:  [98.8 F (37.1 C)-100.3 F (37.9 C)] 100.3 F (37.9 C) (02/29 0754) Pulse Rate:  [86-106] 97 (02/29 0800) Resp:  [16-36] 25 (02/29 0800) BP: (73-119)/(49-76) 98/63 mmHg (02/29 0800) SpO2:  [88 %-98 %] 97 % (02/29 0800) FiO2 (%):  [60 %-80 %] 80 % (02/29 0726) Weight:  [102.9 kg (226 lb 13.7 oz)] 102.9 kg (226 lb 13.7 oz) (02/29 0300) Last BM Date: 04/27/14  Weight change: Filed Weights   04/28/14 0419 04/29/14 0500 04/30/14 0300  Weight: 102.2 kg (225 lb 5 oz) 102.6 kg (226 lb 3.1 oz) 102.9 kg (226 lb 13.7 oz)    Intake/Output:   Intake/Output Summary (Last 24 hours) at 04/30/14 0909 Last data filed at 04/30/14 0805  Gross per 24 hour  Intake 2974.42 ml  Output   1195 ml  Net 1779.42 ml     Physical Exam: General:  On trach.  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   Cor: PMI laterally displaced. Regular  Lungs: diffuse rhonchi Abdomen: soft, nontender,. No hepatosplenomegaly. No bruits or masses. Hypoactive bowel sounds. Extremities: no cyanosis, clubbing, rash, trace- 1+ edema. Neuro: on vent. Sedated   Telemetry: Sinus 90s  Labs: Basic Metabolic Panel:  Recent Labs Lab 04/25/14 0500  04/27/14 0500 04/27/14 1812 04/28/14 0421 04/29/14 0500 04/30/14 0430  NA 150*  < > 154* 152* 149* 146* 148*  K 2.9*  < > 2.4* 3.5 3.3* 2.7* 4.0  CL 109  < > 110 110 109 104 111  CO2 33*  < > 32 32 32 31 34*  GLUCOSE 124*  < > 124* 156*  168* 289* 156*  BUN 106*  < > 111* 118* 119* 118* 145*  CREATININE 1.34  < > 1.27 1.36* 1.34 1.32 1.57*  CALCIUM 8.0*  < > 8.1* 7.9* 7.7* 7.5* 7.6*  MG 2.4  --  2.5  --  2.5  --  2.8*  PHOS 3.0  --  3.5  --  3.6  --   --   < > = values in this interval not displayed.  Liver Function Tests:  Recent Labs Lab 04/24/14 0420  AST 78*  ALT 68*  ALKPHOS 227*  BILITOT 1.1  PROT 5.7*  ALBUMIN 1.7*   No results for input(s): LIPASE, AMYLASE in the last 168 hours. No results for input(s): AMMONIA in the last 168 hours.  CBC:  Recent Labs Lab 04/25/14 0500 04/26/14 0358 04/27/14 0500 04/28/14 0421 04/29/14 0500  WBC 10.5 10.6* 12.3* 14.0* 15.2*  HGB 8.2* 7.9* 7.6* 7.7* 8.3*  HCT 28.3* 27.6* 27.4* 27.6* 28.6*  MCV 97.3 97.5 98.6 100.4* 98.6  PLT 318 282 257 240 219    Cardiac Enzymes: No results for input(s): CKTOTAL, CKMB, CKMBINDEX, TROPONINI in the last 168 hours.  BNP: BNP (last 3 results) No results for input(s): PROBNP in the last 8760 hours.   Other  results:    Imaging: Dg Chest Port 1 View  04/30/2014   CLINICAL DATA:  Respiratory failure  EXAM: PORTABLE CHEST - 1 VIEW  COMPARISON:  Portable chest x-ray of April 28, 2014  FINDINGS: There remain confluent interstitial and alveolar opacities throughout both lungs. The hemidiaphragms are less well demonstrated today. The cardiac silhouette is obscured. The pulmonary vascularity is engorged and indistinct. The PICC line tip projects over the distal third of the SVC. The tracheostomy appliance tip projects at the level of the clavicular heads. The feeding tube tip projects below the inferior margin of the image.  IMPRESSION: Interval worsening of bilateral alveolar opacities consistent with edema, widespread pneumonia, and/or ARDS.   Electronically Signed   By: David  [place]   On: 04/30/2014 07:12     Medications:     Scheduled Medications: . amiodarone  200 mg Oral BID  . antiseptic oral rinse  7 mL Mouth Rinse  QID  . aspirin  81 mg Per Tube Daily  . atorvastatin  80 mg Per Tube q1800  . chlorhexidine  15 mL Mouth Rinse BID  . docusate  100 mg Oral BID  . feeding supplement (PRO-STAT SUGAR FREE 64)  60 mL Per Tube TID  . feeding supplement (VITAL HIGH PROTEIN)  1,000 mL Per Tube Q24H  . free water  300 mL Per Tube Q6H  . heparin subcutaneous  5,000 Units Subcutaneous 3 times per day  . insulin aspart  0-15 Units Subcutaneous 6 times per day  . insulin glargine  35 Units Subcutaneous Daily  . ipratropium  0.5 mg Nebulization Q6H  . levalbuterol  0.63 mg Nebulization Q6H  . multivitamin  5 mL Per Tube Daily  . pantoprazole sodium  40 mg Per Tube Q24H  . QUEtiapine  50 mg Oral QHS  . sodium chloride  10-40 mL Intracatheter Q12H  . sodium chloride  10-40 mL Intracatheter Q12H  . ticagrelor  90 mg Per Tube BID    Infusions: . sodium chloride 10 mL/hr at 04/27/14 1800  . dextrose 30 mL (04/30/14 0800)  . fentaNYL infusion INTRAVENOUS 100 mcg/hr (04/30/14 1000)  . norepinephrine (LEVOPHED) Adult infusion 12 mcg/min (04/30/14 1000)    PRN Medications: acetaminophen (TYLENOL) oral liquid 160 mg/5 mL, bisacodyl, fentaNYL, Influenza vac split quadrivalent PF, levalbuterol, ondansetron (ZOFRAN) IV, pneumococcal 23 valent vaccine, sennosides, sodium chloride, sodium chloride, sorbitol   Assessment:   1. Cardiogenic shock    --s/p Impella placement 1/31, Impella out 2/5.  2. Acute on chronic systolic HF    --EF 10% on echo    --Repeat echo (2/7) with EF 25-30%, peri-apical akinesis, no LV thrombus, normal RV 3. Anterolateral STEMI 1/31   --due to stent occlusion   --s/p PCI with DES to LAD and OM-2 on 1/31   --s/p PCI with DES to RCA 2/5 4. VDRF 5. CAD 6. Tobacco abuse ongoing 7. 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 Hypomagnesemia/hyponatremia 9. Anemia s/p 3u RBCs 10. SVT: Amiodarone started 11. MRSA Pneumonia, hospital acquired 12. Acute kidney injury 13.  Hypernatremia 14. Protein-calorie malnutirition  Plan/Discussion:    Main issue continues to be respiratory failure due to severe MRSA PNA and ARDS. S/p trach c/b air leak. Now with extra-long trach.  Despite trach, respiratory status remains very tenuous with little or no progress being made despite aggressive efforts. P/CCM managing ARDS and airway.   HF currently stable. Lasix stopped due to hypernatremia.  Continue ASA, statin, brilinta.  BP too low for ACE/b-blocker. Back on norepi.   Continue po amio   Prognosis increasingly concerning. D/w Dr. Sung AmabileSimonds. Will speak with family again today about WD of care vs [facility].    Length of Stay: [phone] Arvilla Meresaniel Zyiere Rosemond MD 04/30/2014, 9:09 AM  Advanced Heart Failure Team Pager [phone] (M-F; 7a - 4p)  Please contact [facility] for night-coverage after hours (4p -7a ) and weekends on [email or web address]

## 2014-04-30 NOTE — Progress Notes (Addendum)
PULMONARY / CRITICAL CARE MEDICINE   Name: Job FoundsFrederick Ciaravino MRN: [id number] DOB: 03/17/1955    ADMISSION DATE:  03-13-14 CONSULTATION DATE:  05/03/2014  REFERRING MD :  Dr. Herbie BaltimoreHarding  CHIEF COMPLAINT:  STEMI  INITIAL PRESENTATION:  59 yo smoker with STEMI, cardiogenic shock, VDRF.  He is visiting [place] from [place].  STUDIES:  1/31 Cardiac cath >> severe CAD with occlusion of LAD, circumflex, RCS, ischemic CM >> DES to LAD, circumflex 1/31 Echo >> LVEF 20-25%, severely reduced systolic function 2/04 LHC > DES RCA 2/06 Echo >> EF 25 to 30%, mod LVH, grade 2 diastolic dysfx  SIGNIFICANT EVENTS: 1/31 admit, cardiac cath, impella 2/01 TCTS consulted, transfuse 1 unit PRBC 2/05 impella removed; ?upper GI bleeding  2/08 increased respiratory secretions 2/10 ARDS protocol, added levophed 2/11 start paralytic 2/12 tried off nimbex, liberated to 7cc/kg  2/19 trach placed 2/20 trach huge cuff leak, desaturation, emergent ETT then retrach 2/21 pressors increased 2/29 Worsening edema/ARDS pattern. Empiric steroid trial. Lasix gtt. DC amiodarone  SUBJECTIVE:  RASS -4. Not F/C  VITAL SIGNS: Temp:  [98.8 F (37.1 C)-100.3 F (37.9 C)] 100.3 F (37.9 C) (02/29 1230) Pulse Rate:  [85-106] 89 (02/29 1430) Resp:  [16-35] 27 (02/29 1430) BP: (73-124)/(49-79) 106/69 mmHg (02/29 1400) SpO2:  [88 %-98 %] 96 % (02/29 1430) FiO2 (%):  [70 %-80 %] 80 % (02/29 1430) Weight:  [102.9 kg (226 lb 13.7 oz)] 102.9 kg (226 lb 13.7 oz) (02/29 0300) VENTILATOR SETTINGS: Vent Mode:  [-] PRVC FiO2 (%):  [70 %-80 %] 80 % Set Rate:  [24 bmp-35 bmp] 35 bmp Vt Set:  [450 mL-520 mL] 450 mL PEEP:  [10 cmH20-14 cmH20] 14 cmH20 Plateau Pressure:  [31 cmH20-35 cmH20] 35 cmH20 INTAKE / OUTPUT:  Intake/Output Summary (Last 24 hours) at 04/30/14 1512 Last data filed at 04/30/14 1400  Gross per 24 hour  Intake 2878.55 ml  Output   1550 ml  Net 1328.55 ml   PHYSICAL EXAMINATION:  Gen: sedated, intubated   HEENT: R eye enucleated, no acute changes PULM: B rhonchi CV: Reg, no M AB: soft, + BS Ext: anasarca  Neuro: MAEs  LABS: I have reviewed all of today's lab results. Relevant abnormalities are discussed in the A/P section  CXR: worsening ARDS/edema patter  ASSESSMENT / PLAN:  PULMONARY ETT 1/31>>>2/19 Janina Mayorach [name] 2/19 >>  A:  Acute hypoxic respiratory failure Severe ARDS vs pneumonitis Suspect pum edema   Recent MRSA PNA Smoker Trach status P:   Cont full vent support - settings reviewed and/or adjusted Cont vent bundle Daily SBT if/when meets criteria Trial of steroids 2/29 Lasix gtt 2/29  CARDIOVASCULAR L Impella 1/31>>>2/5 Lt [place] CVL 2/08 >> 2/27 RUE PICC 2/27 >>  A:  STEMI Severe ischemic CM Cardiogenic shock Septic shock 2/10 PSVT, PAF with RVR presently P: Continue ASA, lipitor, brilinta Hold amiodarone given concern for pneumonitis Cont vasopressors to maintain MAP > 65 mmHg  RENAL A:  AKI  Hypokalemia, recurrent  Hypernatremia Anasarca P:   Monitor BMET intermittently Monitor I/Os Correct electrolytes as indicated Furosemide gtt 2/29  GASTROINTESTINAL A:   No acute issues P:   SUP: enteral PPI Cont TFs  HEMATOLOGIC A:  Thrombocytopenia, resolved. Anemia of critical illness  P:  DVT px: SQ heparin Monitor CBC intermittently Transfuse per usual ICU guidelines  INFECTIOUS A: MRSA HCAP >> completed Abx 2/22. P:    Monitor off ABx Check resp culture 2/29  ENDOCRINE A:   Severe hyperglycemia without prior  documentation of DM P:   Cont SSI Increase Lantus while on steroids  NEUROLOGIC A:  Acute encephalopathy ICU/vent associated discomfort P:   RASS goal -1 Cont fentanyl infusion  SUMMARY: Planned family meeting in AM 3/01. I plan to recommend at most a couple day trial of aggressive diuresis and steroids, no escalation of care (no HD, no CPR) and consider terminal extubation by end of week if no significant  improvement  Independent CC time 40 minutes  [name], MD ; [employer] [facility] [employer] service Mobile [phone].  After 5:30 PM or weekends, call [phone]

## 2014-05-01 ENCOUNTER — Inpatient Hospital Stay (HOSPITAL_COMMUNITY): Payer: Medicare Other

## 2014-05-01 LAB — GLUCOSE, CAPILLARY
GLUCOSE-CAPILLARY: 225 mg/dL — AB (ref 70–99)
GLUCOSE-CAPILLARY: 160 mg/dL — AB (ref 70–99)
GLUCOSE-CAPILLARY: 166 mg/dL — AB (ref 70–99)
Glucose-Capillary: 159 mg/dL — ABNORMAL HIGH (ref 70–99)
Glucose-Capillary: 176 mg/dL — ABNORMAL HIGH (ref 70–99)
Glucose-Capillary: 195 mg/dL — ABNORMAL HIGH (ref 70–99)
Glucose-Capillary: 258 mg/dL — ABNORMAL HIGH (ref 70–99)

## 2014-05-01 LAB — CBC
HCT: 30 % — ABNORMAL LOW (ref 39.0–52.0)
Hemoglobin: 8.4 g/dL — ABNORMAL LOW (ref 13.0–17.0)
MCH: 28.2 pg (ref 26.0–34.0)
MCHC: 28 g/dL — AB (ref 30.0–36.0)
MCV: 100.7 fL — ABNORMAL HIGH (ref 78.0–100.0)
PLATELETS: 232 10*3/uL (ref 150–400)
RBC: 2.98 MIL/uL — ABNORMAL LOW (ref 4.22–5.81)
RDW: 20.8 % — AB (ref 11.5–15.5)
WBC: 20.5 10*3/uL — ABNORMAL HIGH (ref 4.0–10.5)

## 2014-05-01 LAB — BASIC METABOLIC PANEL
Anion gap: 11 (ref 5–15)
BUN: 157 mg/dL — AB (ref 6–23)
CO2: 31 mmol/L (ref 19–32)
CREATININE: 1.74 mg/dL — AB (ref 0.50–1.35)
Calcium: 7.9 mg/dL — ABNORMAL LOW (ref 8.4–10.5)
Chloride: 110 mmol/L (ref 96–112)
GFR, EST AFRICAN AMERICAN: 48 mL/min — AB (ref >90)
GFR, EST NON AFRICAN AMERICAN: 41 mL/min — AB (ref >90)
Glucose, Bld: 197 mg/dL — ABNORMAL HIGH (ref 70–99)
Potassium: 4.8 mmol/L (ref 3.5–5.1)
Sodium: 152 mmol/L — ABNORMAL HIGH (ref 135–145)

## 2014-05-01 MED ORDER — INSULIN GLARGINE 100 UNIT/ML ~~LOC~~ SOLN
30.0000 [IU] | Freq: Two times a day (BID) | SUBCUTANEOUS | Status: DC
  Administered 2014-05-01: 30 [IU] via SUBCUTANEOUS
  Filled 2014-05-01 (×3): qty 0.3

## 2014-05-01 MED ORDER — DOCUSATE SODIUM 50 MG/5ML PO LIQD
100.0000 mg | Freq: Two times a day (BID) | ORAL | Status: DC | PRN
  Filled 2014-05-01: qty 10

## 2014-05-01 MED ORDER — FENTANYL BOLUS VIA INFUSION
50.0000 ug | INTRAVENOUS | Status: DC | PRN
  Filled 2014-05-01: qty 100

## 2014-05-01 NOTE — Progress Notes (Signed)
Patient ID: Micheal FoundsFrederick Crow, male   DOB: 11/12/1955, 59 y.o.   MRN: [id number]  Advanced Heart Failure Rounding Note   Subjective:    Impella removed 2/5.  Echo (2/7) with EF 25-30%, peri-apical akinesis, no LV thrombus noted, normal RV.   Underwent trach 2/16 due to severe MRSA PNA and ARDS.    Extra-long trach placed 2/27.  Remains on 80% FiO2. Norepi 10. Lasix restarted yesterday, amio stopped and empiric trial of steroids started yesterday as last effort to improve ARDS. Creatinine up to 1.7. Opens eyes but doesn't follow commands for me. Nurse reports he was responsive and following commands earlier.     Objective:   Weight Range:  Vital Signs:   Temp:  [99 F (37.2 C)-100.3 F (37.9 C)] 99.1 F (37.3 C) (03/01 0400) Pulse Rate:  [84-101] 92 (03/01 0500) Resp:  [16-39] 35 (03/01 0500) BP: (81-124)/(52-81) 104/75 mmHg (03/01 0500) SpO2:  [88 %-98 %] 97 % (03/01 0500) FiO2 (%):  [70 %-80 %] 80 % (03/01 0400) Weight:  [103.7 kg (228 lb 9.9 oz)] 103.7 kg (228 lb 9.9 oz) (03/01 0300) Last BM Date: 04/27/14  Weight change: Filed Weights   04/29/14 0500 04/30/14 0300 05/01/14 0300  Weight: 102.6 kg (226 lb 3.1 oz) 102.9 kg (226 lb 13.7 oz) 103.7 kg (228 lb 9.9 oz)    Intake/Output:   Intake/Output Summary (Last 24 hours) at 05/01/14 0532 Last data filed at 05/01/14 0500  Gross per 24 hour  Intake 4473.21 ml  Output   2135 ml  Net 2338.21 ml     Physical Exam: General:  On trach.  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   Cor: PMI laterally displaced. Regular  Lungs: diffuse rhonchi Abdomen: soft, nontender,. No hepatosplenomegaly. No bruits or masses. Hypoactive bowel sounds. Extremities: no cyanosis, clubbing, rash,2+ edema. Neuro: on vent. opens eyes but doesn't follow commands  Telemetry: Sinus 80-90s  Labs: Basic Metabolic Panel:  Recent Labs Lab 04/25/14 0500  04/27/14 0500 04/27/14 1812  04/28/14 0421 04/29/14 0500 04/30/14 0430 05/01/14 0415  NA 150*  < > 154* 152* 149* 146* 148* 152*  K 2.9*  < > 2.4* 3.5 3.3* 2.7* 4.0 4.8  CL 109  < > 110 110 109 104 111 110  CO2 33*  < > 32 32 32 31 34* 31  GLUCOSE 124*  < > 124* 156* 168* 289* 156* 197*  BUN 106*  < > 111* 118* 119* 118* 145* 157*  CREATININE 1.34  < > 1.27 1.36* 1.34 1.32 1.57* 1.74*  CALCIUM 8.0*  < > 8.1* 7.9* 7.7* 7.5* 7.6* 7.9*  MG 2.4  --  2.5  --  2.5  --  2.8*  --   PHOS 3.0  --  3.5  --  3.6  --   --   --   < > = values in this interval not displayed.  Liver Function Tests: No results for input(s): AST, ALT, ALKPHOS, BILITOT, PROT, ALBUMIN in the last 168 hours. No results for input(s): LIPASE, AMYLASE in the last 168 hours. No results for input(s): AMMONIA in the last 168 hours.  CBC:  Recent Labs Lab 04/26/14 0358 04/27/14 0500 04/28/14 0421 04/29/14 0500 05/01/14 0415  WBC 10.6* 12.3* 14.0* 15.2* 20.5*  HGB 7.9* 7.6* 7.7* 8.3* 8.4*  HCT 27.6* 27.4* 27.6* 28.6* 30.0*  MCV 97.5 98.6 100.4* 98.6 100.7*  PLT 282 257 240 219 232    Cardiac  Enzymes: No results for input(s): CKTOTAL, CKMB, CKMBINDEX, TROPONINI in the last 168 hours.  BNP: BNP (last 3 results) No results for input(s): PROBNP in the last 8760 hours.   Other results:    Imaging: Dg Chest Port 1 View  04/30/2014   CLINICAL DATA:  Respiratory failure  EXAM: PORTABLE CHEST - 1 VIEW  COMPARISON:  Portable chest x-ray of April 28, 2014  FINDINGS: There remain confluent interstitial and alveolar opacities throughout both lungs. The hemidiaphragms are less well demonstrated today. The cardiac silhouette is obscured. The pulmonary vascularity is engorged and indistinct. The PICC line tip projects over the distal third of the SVC. The tracheostomy appliance tip projects at the level of the clavicular heads. The feeding tube tip projects below the inferior margin of the image.  IMPRESSION: Interval worsening of bilateral alveolar  opacities consistent with edema, widespread pneumonia, and/or ARDS.   Electronically Signed   By: David  [place]   On: 04/30/2014 07:12     Medications:     Scheduled Medications: . antiseptic oral rinse  7 mL Mouth Rinse QID  . aspirin  81 mg Per Tube Daily  . atorvastatin  80 mg Per Tube q1800  . chlorhexidine  15 mL Mouth Rinse BID  . docusate  100 mg Oral BID  . feeding supplement (PRO-STAT SUGAR FREE 64)  60 mL Per Tube TID  . feeding supplement (VITAL HIGH PROTEIN)  1,000 mL Per Tube Q24H  . free water  300 mL Per Tube Q4H  . heparin subcutaneous  5,000 Units Subcutaneous 3 times per day  . insulin aspart  0-15 Units Subcutaneous 6 times per day  . insulin glargine  35 Units Subcutaneous BID  . ipratropium  0.5 mg Nebulization Q6H  . levalbuterol  0.63 mg Nebulization Q6H  . methylPREDNISolone (SOLU-MEDROL) injection  80 mg Intravenous BID  . multivitamin  5 mL Per Tube Daily  . pantoprazole sodium  40 mg Per Tube Q24H  . potassium chloride  40 mEq Per Tube BID  . sodium chloride  10-40 mL Intracatheter Q12H  . ticagrelor  90 mg Per Tube BID    Infusions: . sodium chloride 10 mL/hr at 04/30/14 1900  . fentaNYL infusion INTRAVENOUS 150 mcg/hr (04/30/14 2340)  . furosemide (LASIX) infusion 4 mg/hr (04/30/14 1900)  . norepinephrine (LEVOPHED) Adult infusion 10 mcg/min (04/30/14 2340)    PRN Medications: acetaminophen (TYLENOL) oral liquid 160 mg/5 mL, artificial tears, bisacodyl, fentaNYL, Influenza vac split quadrivalent PF, levalbuterol, ondansetron (ZOFRAN) IV, pneumococcal 23 valent vaccine, sodium chloride, sorbitol   Assessment:   1. Cardiogenic shock    --s/p Impella placement 1/31, Impella out 2/5.  2. Acute on chronic systolic HF    --EF 10% on echo    --Repeat echo (2/7) with EF 25-30%, peri-apical akinesis, no LV thrombus, normal RV 3. Anterolateral STEMI 1/31   --due to stent occlusion   --s/p PCI with DES to LAD and OM-2 on 1/31   --s/p PCI with DES to  RCA 2/5 4. VDRF 5. CAD 6. Tobacco abuse ongoing 7. 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 Hypomagnesemia/hyponatremia 9. Anemia s/p 3u RBCs 10. SVT: Amiodarone started 11. MRSA Pneumonia, hospital acquired 12. Acute kidney injury 13. Hypernatremia 14. Protein-calorie malnutirition  Plan/Discussion:    Main issue continues to be respiratory failure due to severe MRSA PNA and ARDS. S/p trach c/b air leak. Now with extra-long trach.  Despite trach, respiratory status remains very tenuous with  little or no progress being made despite aggressive efforts. P/CCM managing ARDS and airway.   HF currently stable. Lasix restarted to help dry out lungs but CR and sodium up. Will hold for now. Continue ASA, statin, brilinta. BP too low for ACE/b-blocker. Back on norepi.   Off amio for now but doubt it was part of ARDS. Ok to hold for now.   Prognosis increasingly concerning. D/w Dr. Sung Amabile. Agree with family meeting and consideration of WD of care by end of week if no progress    Length of Stay: 30 Arvilla Meres MD 05/01/2014, 5:32 AM  Advanced Heart Failure Team Pager [phone] (M-F; 7a - 4p)  Please contact [facility] for night-coverage after hours (4p -7a ) and weekends on [email or web address]

## 2014-05-01 NOTE — Progress Notes (Signed)
PULMONARY / CRITICAL CARE MEDICINE   Name: Micheal Ayala MRN: [id number] DOB: 09/10/1955    ADMISSION DATE:  03/14/2014 CONSULTATION DATE:  03/07/2014  REFERRING MD :  Dr. Herbie BaltimoreHarding  CHIEF COMPLAINT:  STEMI  INITIAL PRESENTATION:  59 yo smoker with STEMI, cardiogenic shock, VDRF.  He is visiting [place] from [place].  STUDIES:  1/31 Cardiac cath >> severe CAD with occlusion of LAD, circumflex, RCS, ischemic CM >> DES to LAD, circumflex 1/31 Echo >> LVEF 20-25%, severely reduced systolic function 2/04 LHC > DES RCA 2/06 Echo >> EF 25 to 30%, mod LVH, grade 2 diastolic dysfx  SIGNIFICANT EVENTS: 1/31 admit, cardiac cath, impella 2/01 TCTS consulted, transfuse 1 unit PRBC 2/05 impella removed; ?upper GI bleeding  2/08 increased respiratory secretions 2/10 ARDS protocol, added levophed 2/11 start paralytic 2/12 tried off nimbex, liberated to 7cc/kg  2/19 trach placed 2/20 trach huge cuff leak, desaturation, emergent ETT then retrach 2/21 pressors increased 2/29 Worsening edema/ARDS pattern. Empiric steroid trial. Lasix gtt. DC amiodarone 3/01 Family conference: DNR if arrests. No escalation. Family expressed wishe to continue support X 2-3 days to see if steroid trial successful  SUBJECTIVE:  RASS -2. Not F/C  VITAL SIGNS: Temp:  [97.7 F (36.5 C)-99.9 F (37.7 C)] 98.6 F (37 C) (03/01 1230) Pulse Rate:  [81-98] 94 (03/01 1534) Resp:  [18-39] 35 (03/01 1534) BP: (81-115)/(52-81) 115/76 mmHg (03/01 1534) SpO2:  [88 %-97 %] 92 % (03/01 1534) FiO2 (%):  [70 %-80 %] 80 % (03/01 1534) Weight:  [103.7 kg (228 lb 9.9 oz)] 103.7 kg (228 lb 9.9 oz) (03/01 0300) VENTILATOR SETTINGS: Vent Mode:  [-] PRVC FiO2 (%):  [70 %-80 %] 80 % Set Rate:  [35 bmp] 35 bmp Vt Set:  [450 mL] 450 mL PEEP:  [5 cmH20-14 cmH20] 5 cmH20 Plateau Pressure:  [25 cmH20-37 cmH20] 35 cmH20 INTAKE / OUTPUT:  Intake/Output Summary (Last 24 hours) at 05/01/14 1549 Last data filed at 05/01/14 1300  Gross per  24 hour  Intake 3108.51 ml  Output   1540 ml  Net 1568.51 ml   PHYSICAL EXAMINATION:  Gen: sedated, intubated  HEENT: R eye enucleated, no acute changes PULM: B rhonchi CV: Reg, no M AB: soft, + BS Ext: anasarca  Neuro: MAEs  LABS: I have reviewed all of today's lab results. Relevant abnormalities are discussed in the A/P section  CXR: NSC  ASSESSMENT / PLAN:  PULMONARY ETT 1/31>>>2/19 Janina Mayorach [name] 2/19 >>  A:  Acute hypoxic respiratory failure Severe ARDS vs pneumonitis Suspect pum edema   Recent MRSA PNA Smoker Trach status P:   Cont full vent support - settings reviewed and/or adjusted Cont vent bundle Daily SBT if/when meets criteria Trial of steroids 2/29 Lasix gtt 2/29  CARDIOVASCULAR L Impella 1/31>>>2/5 Lt [place] CVL 2/08 >> 2/27 RUE PICC 2/27 >>  A:  STEMI Severe ischemic CM Cardiogenic shock Septic shock 2/10 PSVT, PAF with RVR presently P: Continue ASA, lipitor, brilinta Hold amiodarone given concern for pneumonitis Cont vasopressors to maintain MAP > 65 mmHg  RENAL A:  AKI  Hypokalemia, recurrent  Hypernatremia Anasarca P:   Monitor BMET intermittently Monitor I/Os Correct electrolytes as indicated Furosemide gtt 2/29  GASTROINTESTINAL A:   No acute issues P:   SUP: enteral PPI Cont TFs  HEMATOLOGIC A:  Thrombocytopenia, resolved. Anemia of critical illness  P:  DVT px: SQ heparin Monitor CBC intermittently Transfuse per usual ICU guidelines  INFECTIOUS A: MRSA HCAP >> completed Abx 2/22. P:  Monitoring off ABx Resp culture 2/29 >>   ENDOCRINE A:   Severe hyperglycemia without prior documentation of DM P:   Cont SSI Cont Lantus while on steroids - dose adjusted  NEUROLOGIC A:  Acute encephalopathy ICU/vent associated discomfort P:   RASS goal -3 Cont fentanyl infusion    Independent CC time 60 minutes  [name], MD ; [facility] [phone].  After 5:30 PM or weekends, call  [phone]

## 2014-05-01 DEATH — deceased

## 2014-05-02 ENCOUNTER — Inpatient Hospital Stay (HOSPITAL_COMMUNITY): Payer: Medicare Other

## 2014-05-02 DIAGNOSIS — N179 Acute kidney failure, unspecified: Secondary | ICD-10-CM

## 2014-05-02 LAB — BASIC METABOLIC PANEL
Anion gap: 12 (ref 5–15)
BUN: 195 mg/dL — AB (ref 6–23)
CO2: 27 mmol/L (ref 19–32)
Calcium: 7.7 mg/dL — ABNORMAL LOW (ref 8.4–10.5)
Chloride: 110 mmol/L (ref 96–112)
Creatinine, Ser: 2.31 mg/dL — ABNORMAL HIGH (ref 0.50–1.35)
GFR calc Af Amer: 34 mL/min — ABNORMAL LOW (ref >90)
GFR calc non Af Amer: 29 mL/min — ABNORMAL LOW (ref >90)
GLUCOSE: 142 mg/dL — AB (ref 70–99)
Potassium: 6 mmol/L — ABNORMAL HIGH (ref 3.5–5.1)
Sodium: 149 mmol/L — ABNORMAL HIGH (ref 135–145)

## 2014-05-02 LAB — GLUCOSE, CAPILLARY
GLUCOSE-CAPILLARY: 117 mg/dL — AB (ref 70–99)
GLUCOSE-CAPILLARY: 124 mg/dL — AB (ref 70–99)

## 2014-05-02 LAB — CBC
HEMATOCRIT: 30.2 % — AB (ref 39.0–52.0)
HEMOGLOBIN: 8.5 g/dL — AB (ref 13.0–17.0)
MCH: 29.1 pg (ref 26.0–34.0)
MCHC: 28.1 g/dL — ABNORMAL LOW (ref 30.0–36.0)
MCV: 103.4 fL — ABNORMAL HIGH (ref 78.0–100.0)
PLATELETS: 229 10*3/uL (ref 150–400)
RBC: 2.92 MIL/uL — ABNORMAL LOW (ref 4.22–5.81)
RDW: 21.2 % — AB (ref 11.5–15.5)
WBC: 25.3 10*3/uL — AB (ref 4.0–10.5)

## 2014-05-02 MED ORDER — INSULIN GLARGINE 100 UNIT/ML ~~LOC~~ SOLN
15.0000 [IU] | Freq: Two times a day (BID) | SUBCUTANEOUS | Status: DC
  Filled 2014-05-02: qty 0.15

## 2014-05-02 MED ORDER — LORAZEPAM 2 MG/ML IJ SOLN
1.0000 mg | INTRAMUSCULAR | Status: AC | PRN
  Administered 2014-05-02 (×2): 4 mg via INTRAVENOUS
  Filled 2014-05-02: qty 2

## 2014-05-02 MED ORDER — SODIUM POLYSTYRENE SULFONATE 15 GM/60ML PO SUSP
60.0000 g | Freq: Once | ORAL | Status: DC
  Filled 2014-05-02: qty 240

## 2014-05-02 MED ORDER — LORAZEPAM 2 MG/ML IJ SOLN
INTRAMUSCULAR | Status: AC
  Filled 2014-05-02: qty 2

## 2014-05-02 MED ORDER — DEXTROSE 5 % IV SOLN: INTRAVENOUS | Status: DC

## 2014-05-02 NOTE — Progress Notes (Signed)
Further deterioration with worsening CXR, gas exchange, renal function. I have spoken with daughter and she reports that family is ready to proceed with discontinuation of vent support and full comfort care. We will await rest of family's arrival.   Billy Fischeravid Kathy Wahid, MD ; [facility] service Mobile [phone].  After 5:30 PM or weekends, call [phone]

## 2014-05-02 NOTE — Progress Notes (Signed)
Nutrition Brief Note  Chart reviewed. Pt now transitioning to comfort care.  No further nutrition interventions warranted at this time.  Please re-consult as needed.   Kida Digiulio RD, LDN, CNSC [phone] Pager [phone] After Hours Pager    

## 2014-05-02 NOTE — Progress Notes (Signed)
Patient removed from ventilator to room air per family request and MD order. No complications. Family and RN at bedside. RT will continue to monitor.

## 2014-05-02 NOTE — Progress Notes (Addendum)
Fentanyl wasted in sink with 2 RN witness. Malachi ProKristina Wolf RN, Edd FabianJesse Hayk Divis RN

## 2014-05-06 NOTE — Discharge Summary (Addendum)
Advanced Heart Failure Team  Discharge/Death Summary   Patient ID: Micheal Ayala MRN: [id number], DOB/AGE: 59/11/1955 59 y.o. Admit date: 2014/06/26 D/C date:     05/28/2014   Primary Discharge Diagnoses:  1. Acute anterior myocardial infarction 2. Acute systolic heart failure 3. Cardiogenic shock requiring Impella support 4. Acute respiratory failure 5. MRSA pneumonia 6. ARDS 7. CKD, stage III 8. Paroxysmal atrial fibrillation 9. Coronary artery disease 10. Hypernatremia/hypokalemia   Hospital Course:   Micheal Ayala, Micheal Ayala was a 59 y.o male from [place] with a h/o CAD and previous traumatic head injury. He was in [place] for his mother's funeral when he developed severe CP. ECG showed anterior ST elevation. [facility] and taken directly to the cardiac catheterization laboratory where he was intubated for worsening hypoxia and shock. Cath showed total occlusion of both his LAD and co-dominant LCX as well as cardiogenic shock physiology, He underwent emergent Impella placement by Dr. Herbie BaltimoreHarding followed by PCI of the LAD and LCX. He was then transported to the CCU. Management was assumed by the advanced HF team. Despite Impella support, the patient had persistent shock physiology with a very high SVR. He was treated with milrinone and levophed with progressive improvement in his hemodynamics. Echo showed EF 25-30%. On 2/416 the patient underwent PCI of a high-grade RCA lesion prior to Impella extraction. As his hemodynamics improved, the Impella was removed on 04/06/14 and his inotropes were weaned slowly. He developed PAF and was treated with IV amiodarone with resolution.   Unfortunately on 2/8 the patient's respiratory status worsened and he developed severe MRSA pneumonia complicated by ARDS and high vent requirements. The pulmonary/CCM team treated him aggressively with broad spectrum abx and pulmonary toilet. However the patient could not be weaned from the vent After multiple family  discussions, it was decided to proceed with tracheostomy on 04/20/14. Subsequently the patient developed a cuff leak and the tracheostomy was exchanged for an extra-long trach. Despite aggressive care with antibiotics, steroids and pulmonary toilet the patient became more hypotensive and hypoxic. Further discussions were had with the patent's family and the decision was made to proceed with terminal extubation on 05/16/2014. The patient was treated with comfort measures and dies shortly after extubation.    Micheal Meresaniel [name] MD  05/06/2014, 5:17 PM

## 2014-05-08 ENCOUNTER — Telehealth: Payer: Self-pay

## 2014-05-08 NOTE — Telephone Encounter (Signed)
Received original death certificate [id number]/0/983/8/[id number] for Dr. Sung AmabileSimonds.  Apparently, a faxed copy was sent to [id number] for signature on 05/04/14 and did not come thru HIM.  Will send to [employer] 05/09/14 for signature.  Received back 05/09/14.  [facility]/10/[facility].

## 2014-06-01 DEATH — deceased

## 2014-10-06 ENCOUNTER — Encounter (HOSPITAL_COMMUNITY): Payer: Self-pay | Admitting: Cardiology

## 2016-10-07 IMAGING — CR DG CHEST 1V PORT
1 series · 1 of 1 positions shown · non-contrast
Comparison: 04/01/2014.

CLINICAL DATA: Acute respiratory failure.

EXAM:
PORTABLE CHEST - 1 VIEW

[AP]
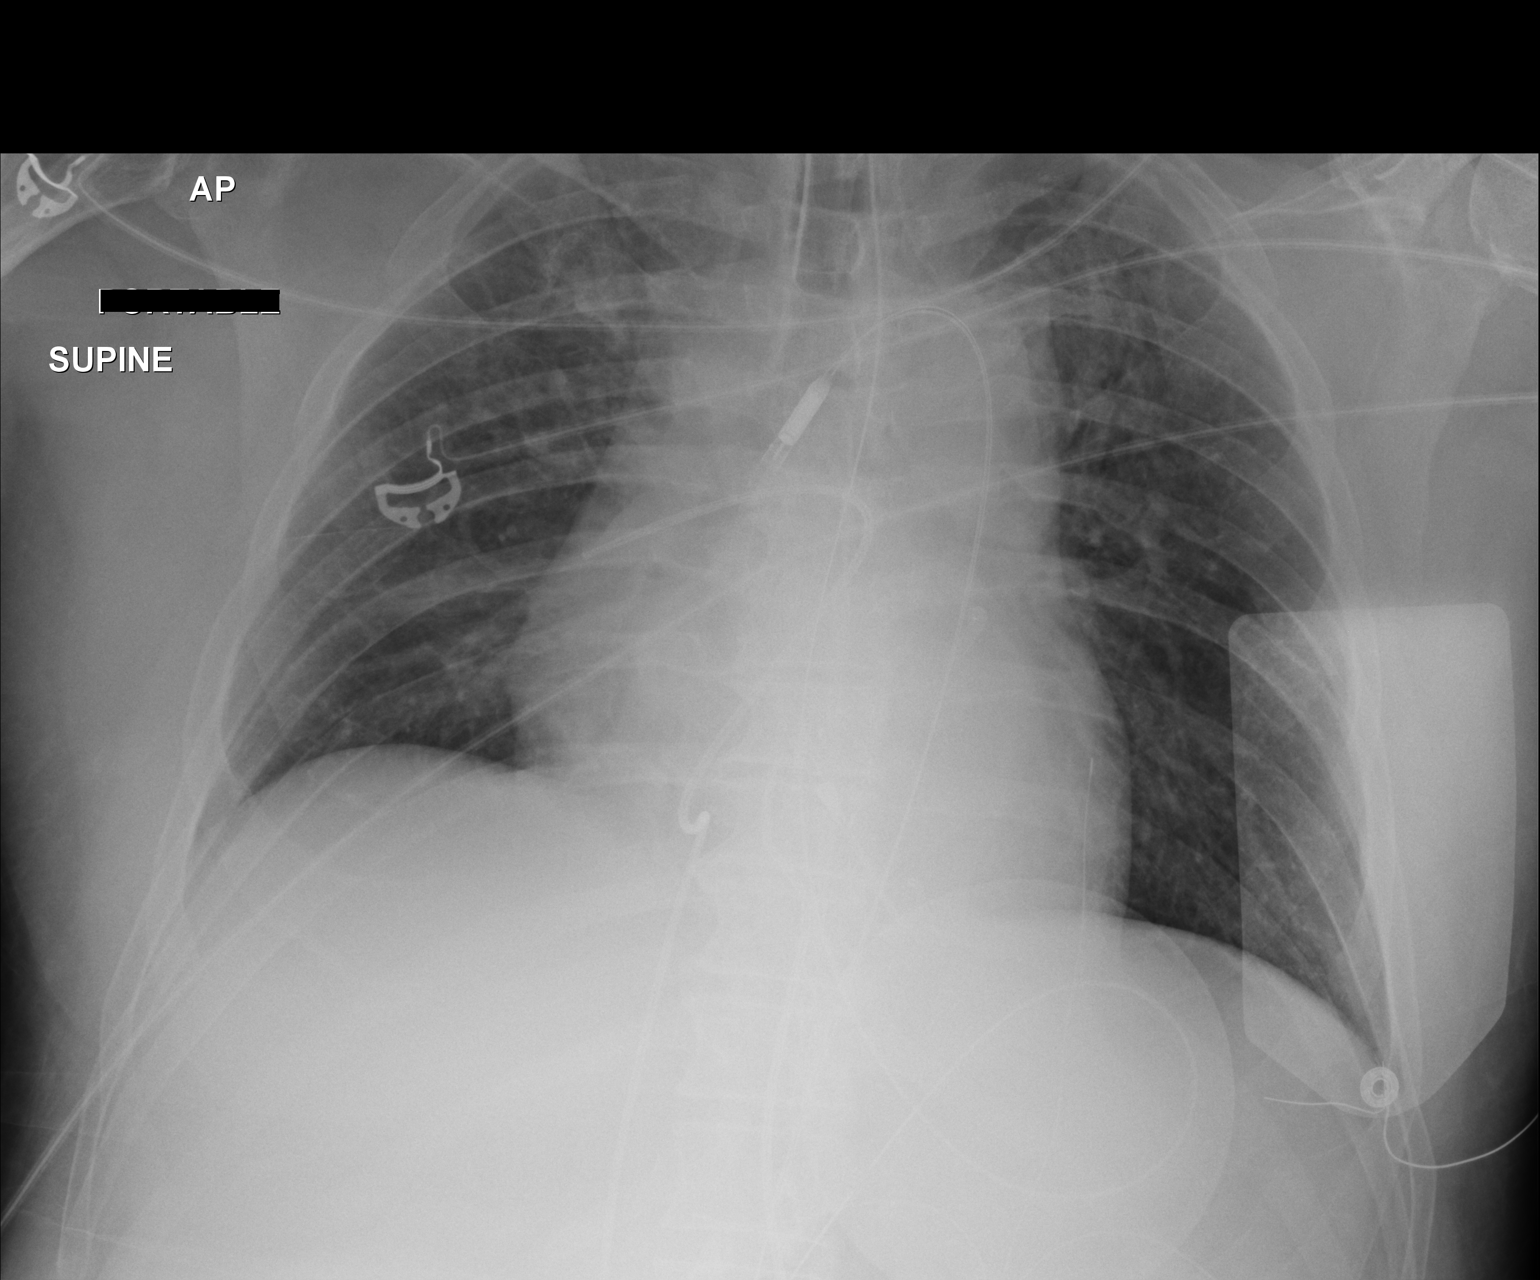

[1 of 1 positions shown; findings below may reference images not displayed]

FINDINGS: Endotracheal tube, NG tube and right femoral Swan-Ganz catheter in
stable position. Small curl again noted in the Swan-Ganz catheter.
Swan-Ganz catheter tip is in the right main pulmonary artery in
unchanged position. Interim clearing of bilateral pulmonary
edema/infiltrates. No pleural effusion or pneumothorax. Stable
cardiomegaly. No acute osseous abnormality.
IMPRESSION: 1. Lines and tubes in stable position. Right femoral Swan-Ganz
catheter stable position. Again noted is a small curl in the
Swan-Ganz catheter.
2. Interim clearing of pulmonary edema/infiltrates.

## 2016-10-08 IMAGING — CR DG CHEST 1V PORT
1 series · 1 of 1 positions shown · non-contrast
Comparison: 04/02/2014.

CLINICAL DATA: Respiratory failure.

EXAM:
PORTABLE CHEST - 1 VIEW

[AP]
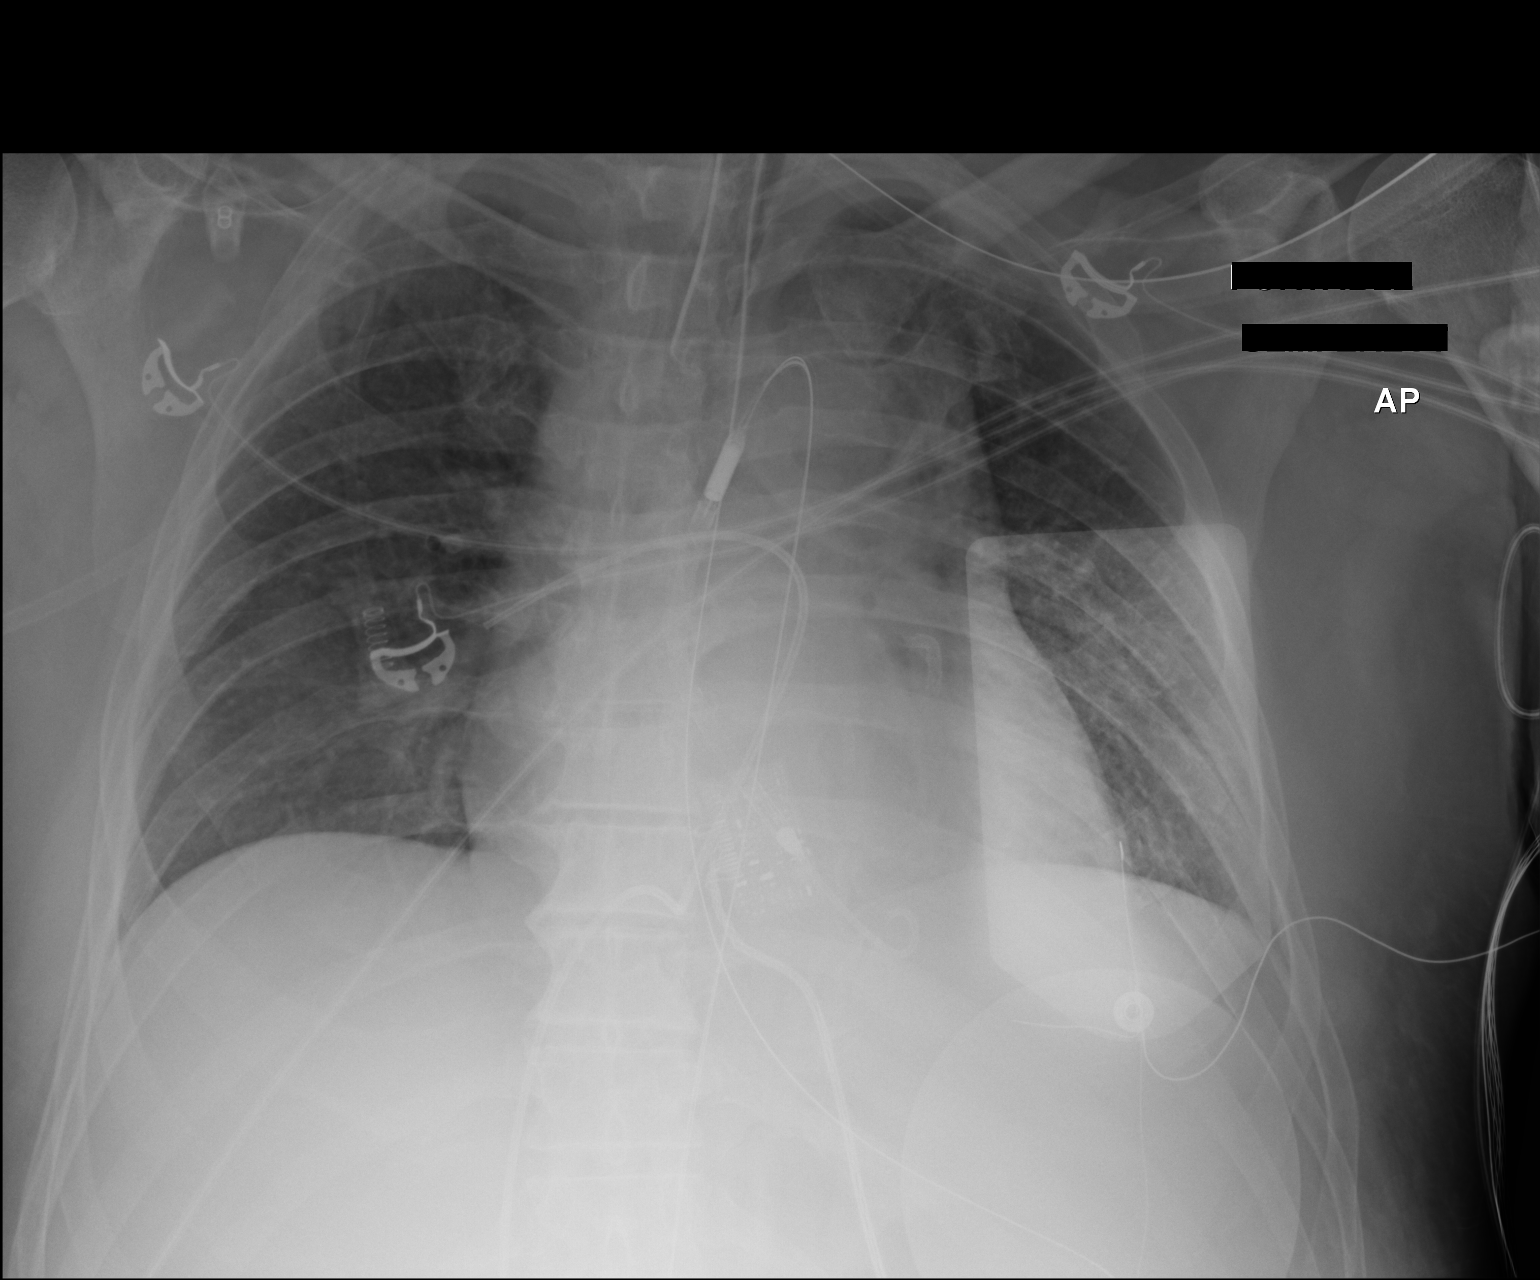

[1 of 1 positions shown; findings below may reference images not displayed]

FINDINGS: Endotracheal tube and NG tube in stable position. Swan-Ganz catheter
in stable position with tip in right main pulmonary artery. Slight
curl is again noted in the Swan-Ganz catheter. Stable cardiomegaly.
Low lung volumes with bibasilar mild atelectasis and/or infiltrates.
No pleural effusion or pneumothorax.
IMPRESSION: 1. Lines and tubes in stable position. Right femoral Swan-Ganz
catheter unchanged with its tip in the right main pulmonary artery.
2. Stable cardiomegaly.  No pulmonary venous congestion.
3. Low lung volumes with mild bibasilar atelectasis and/or
infiltrates.

## 2016-10-09 IMAGING — CR DG ABD PORTABLE 1V
2 series · 2 of 2 positions shown · non-contrast
Comparison: Chest x-ray obtained earlier today

CLINICAL DATA: 58-year-old male with 1 day history of abdominal
pain and distension

EXAM:
PORTABLE ABDOMEN - 1 VIEW

[AP (1 of 2)]
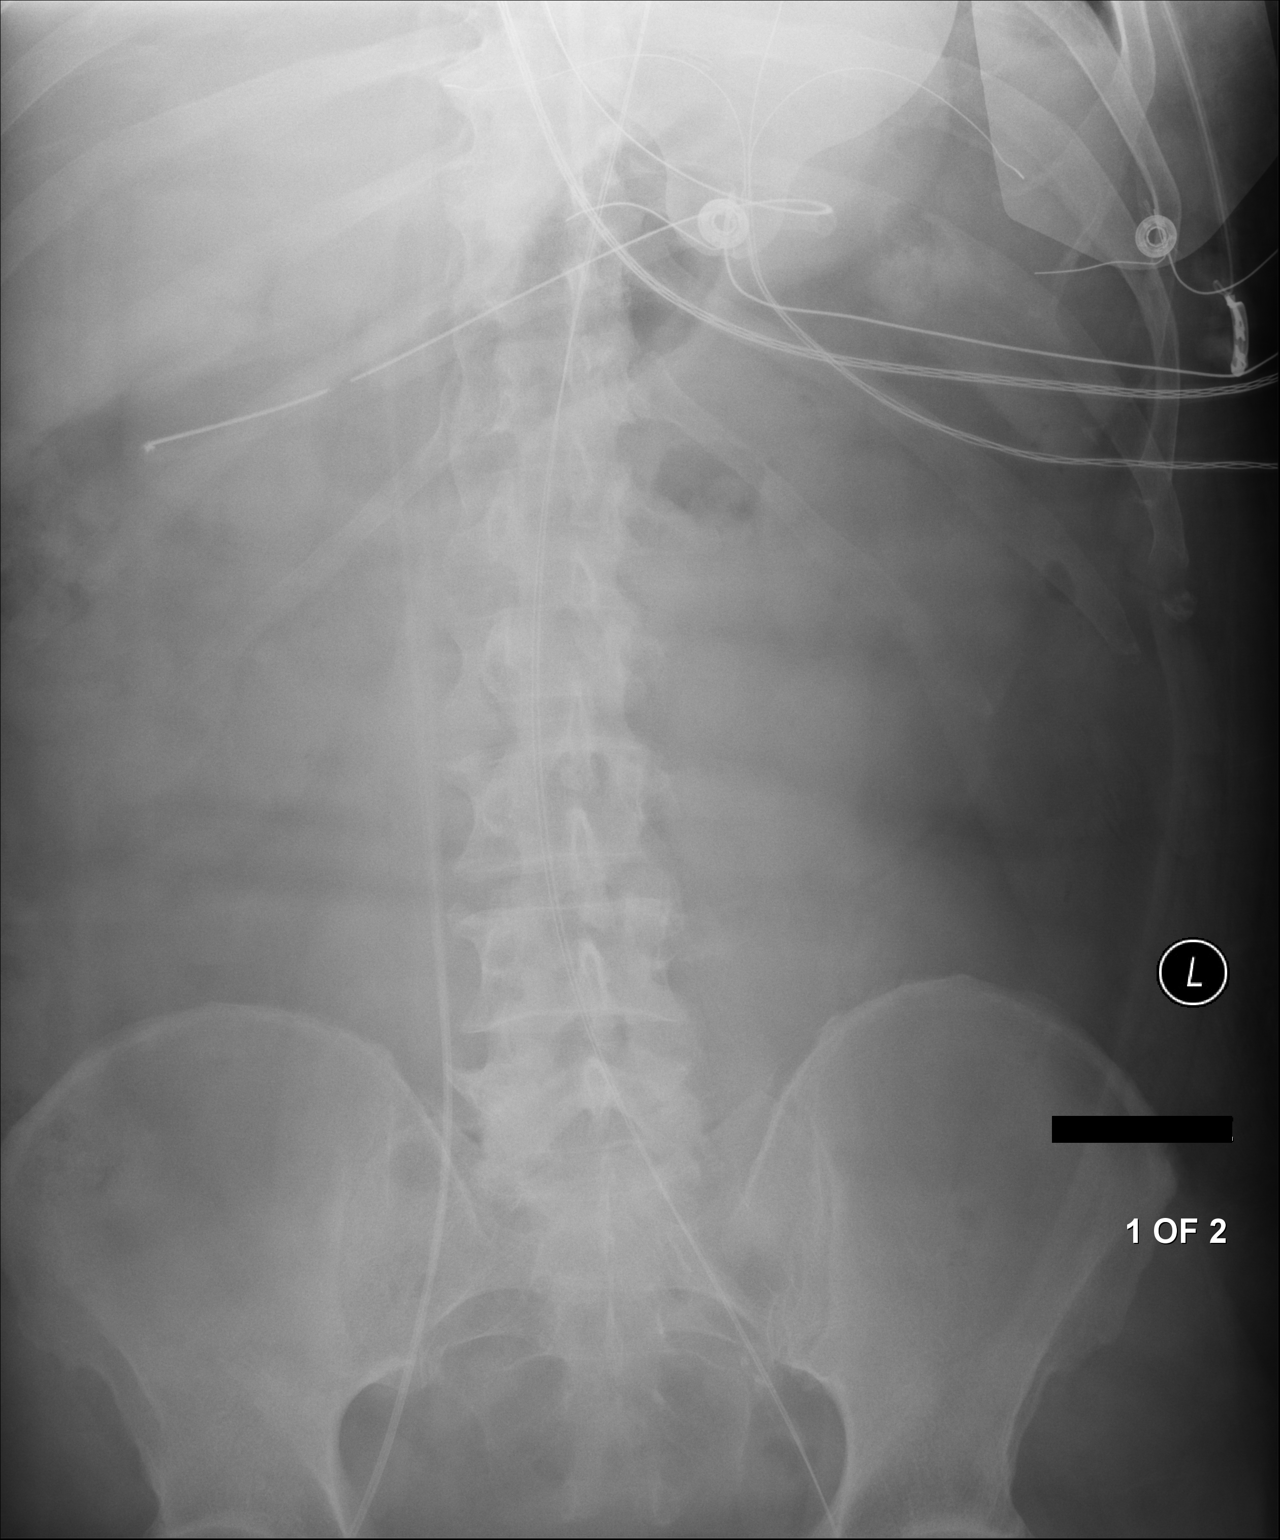

[AP (2 of 2)]
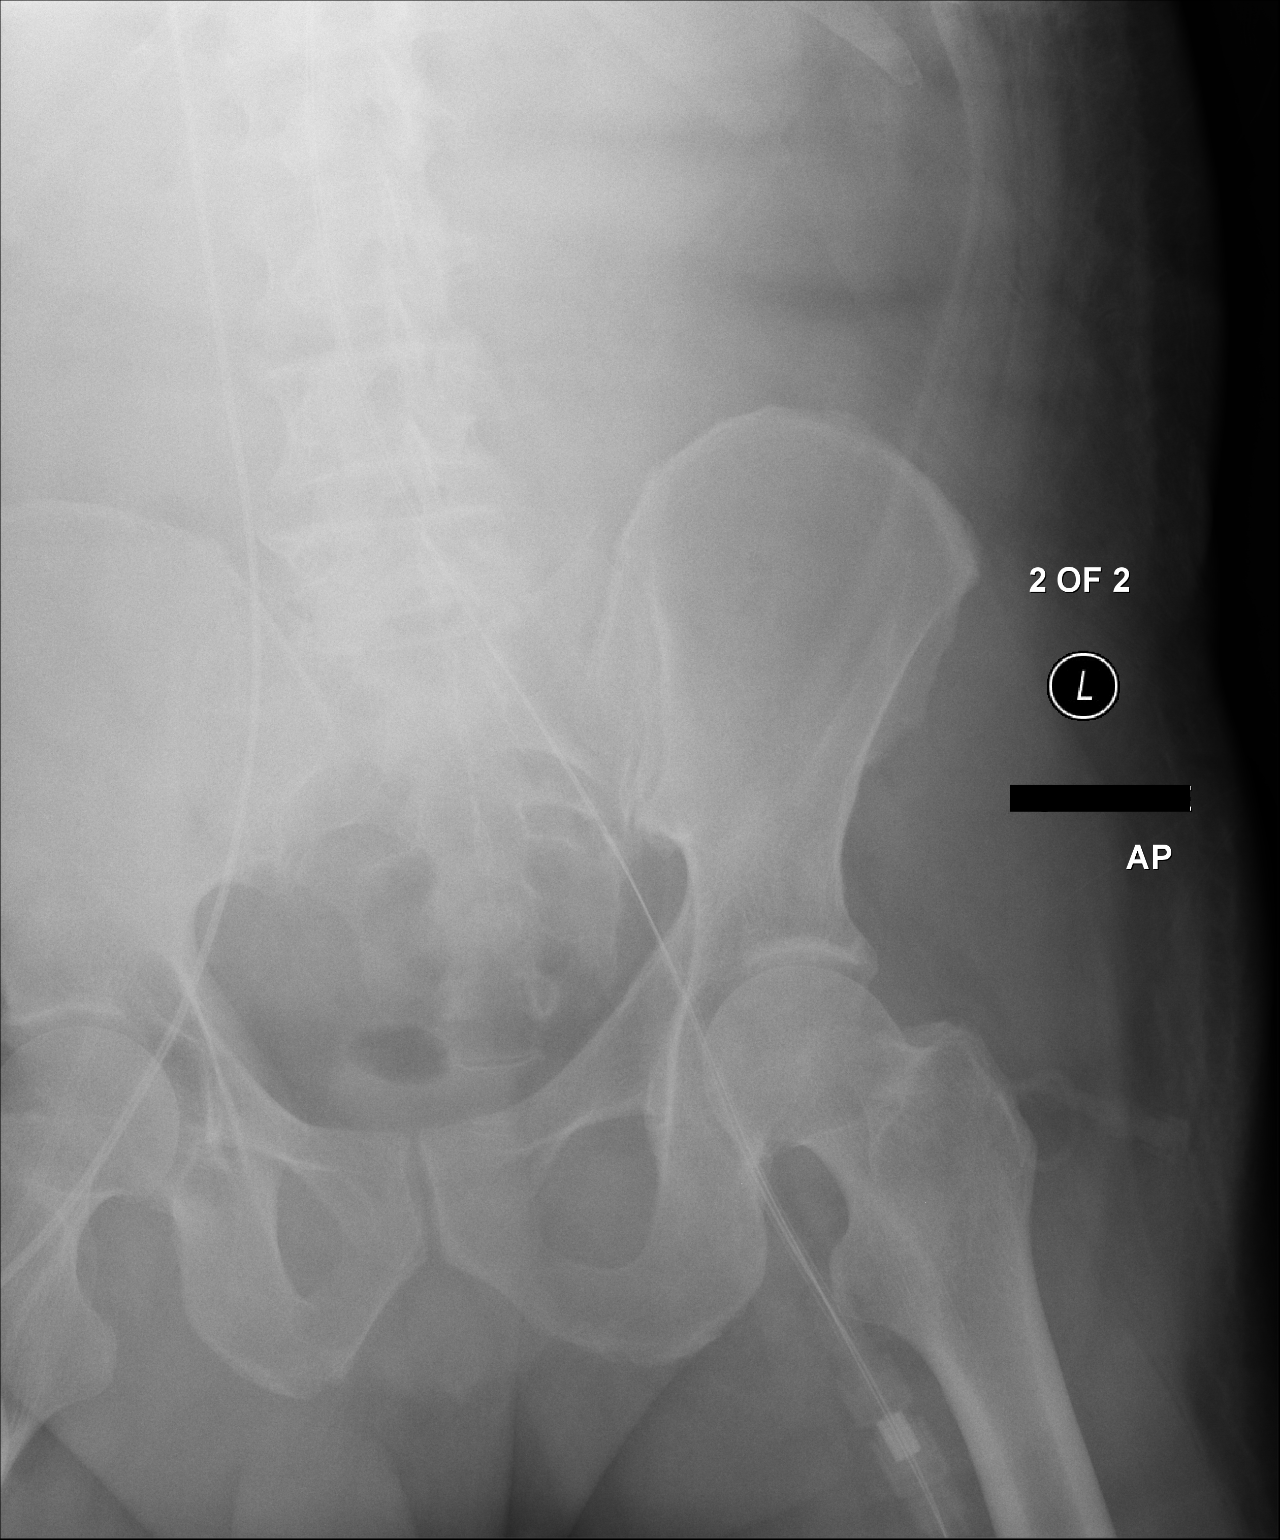

[2 of 2 positions shown; findings below may reference images not displayed]

FINDINGS: A nasogastric tube is present. The tip projects over the gastric
antrum. The bowel gas pattern is not obstructed. Small volume of gas
is noted overlying the rectum in the pelvis. No massive free air on
these supine radiographs. No organomegaly. No acute osseous
abnormality.
IMPRESSION: 1. No evidence of obstruction.
2. Gastric tube in good position.

## 2016-10-09 IMAGING — CR DG CHEST 1V PORT
1 series · 1 of 1 positions shown · non-contrast
Comparison: 04/03/2014.

CLINICAL DATA: Cardiogenic shock.

EXAM:
PORTABLE CHEST - 1 VIEW

[AP]
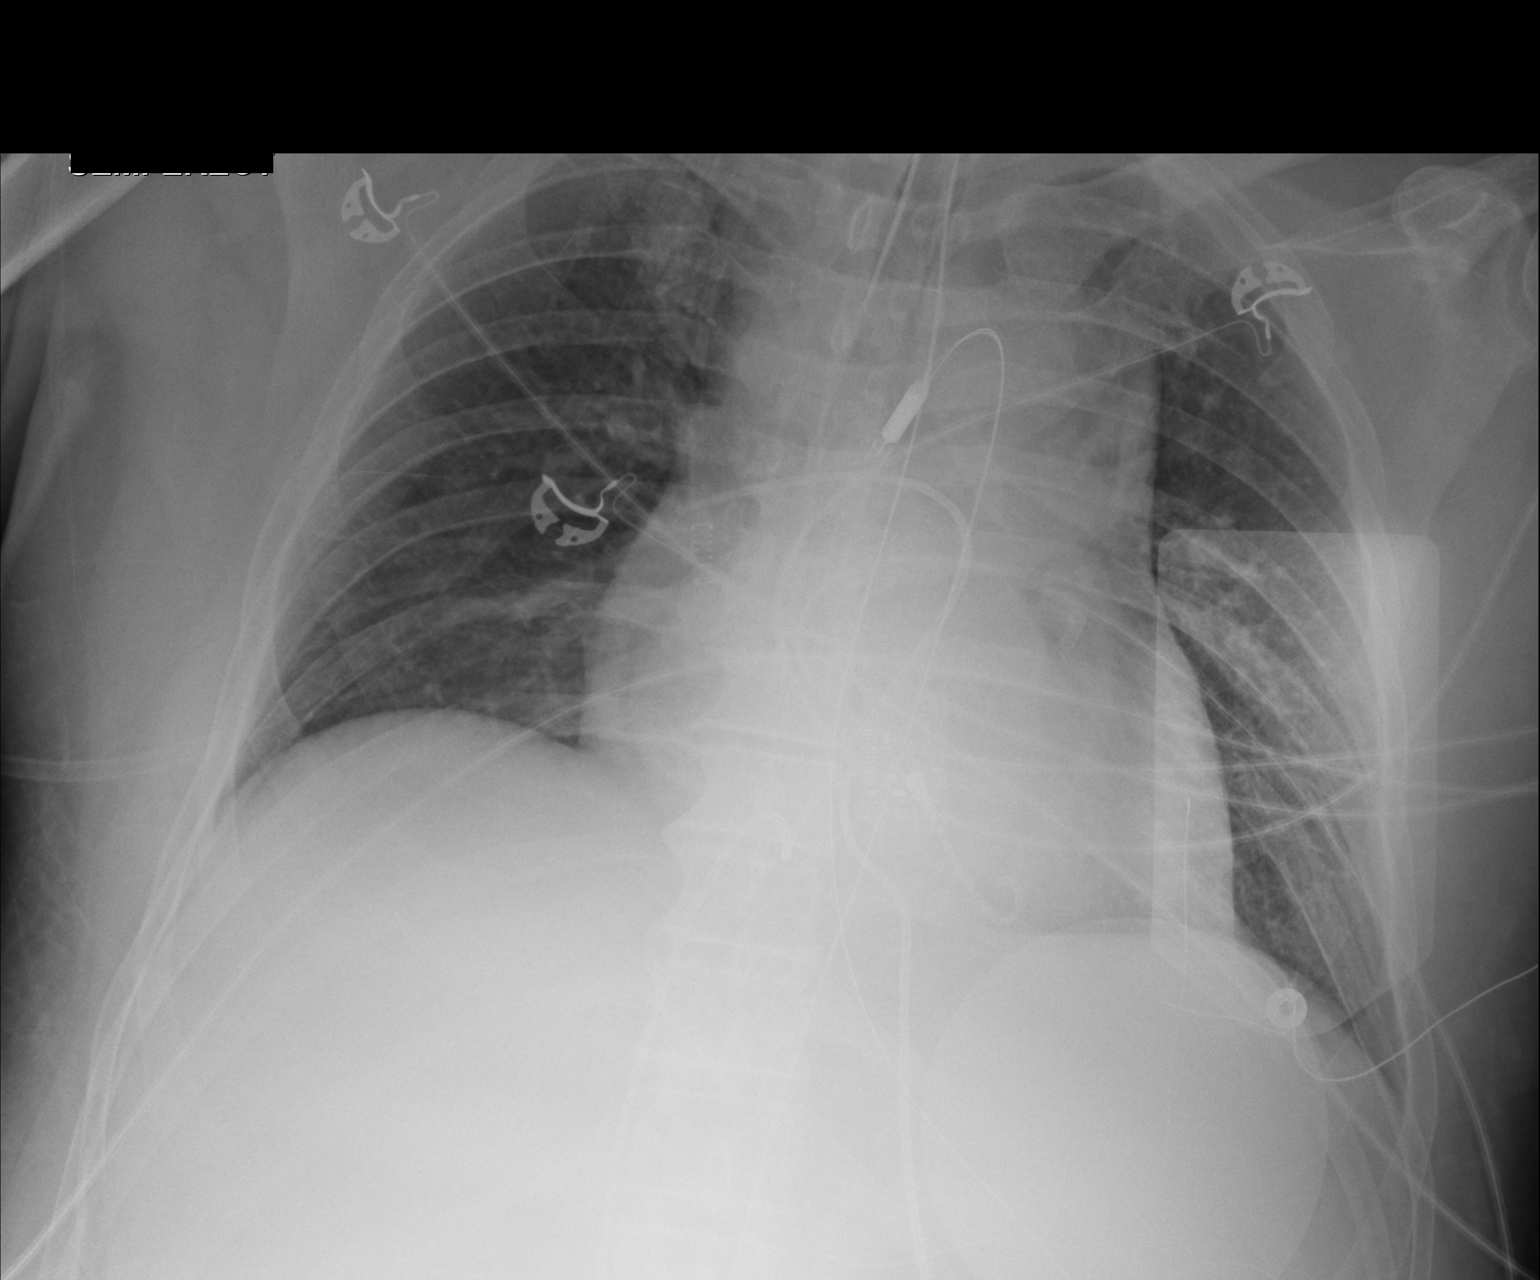

[1 of 1 positions shown; findings below may reference images not displayed]

FINDINGS: Endotracheal tube and NG tube in stable position. Swan-Ganz catheter
stable position. Swan-Ganz catheter is straightened. Stable
cardiomegaly. Low lung volumes with mild basilar and left upper lobe
atelectasis. No pleural effusion or pneumothorax.
IMPRESSION: 1. Lines and tubes in stable position. Femoral Swan-Ganz catheter
stable position. The catheter has straightened.
2. Stable cardiomegaly .  No pulmonary venous congestion.
3. Low lung volumes with persistent bibasilar and left upper lobe
atelectasis.
